# Patient Record
Sex: Female | Born: 1970
Health system: Southern US, Community
[De-identification: ages and names within clinical notes are randomized; demographics above are authoritative.]

## PROBLEM LIST (undated history)

## (undated) DIAGNOSIS — G8929 Other chronic pain: Secondary | ICD-10-CM

## (undated) DIAGNOSIS — N200 Calculus of kidney: Secondary | ICD-10-CM

## (undated) DIAGNOSIS — M549 Dorsalgia, unspecified: Secondary | ICD-10-CM

## (undated) DIAGNOSIS — M1990 Unspecified osteoarthritis, unspecified site: Secondary | ICD-10-CM

## (undated) DIAGNOSIS — M25569 Pain in unspecified knee: Secondary | ICD-10-CM

## (undated) DIAGNOSIS — K219 Gastro-esophageal reflux disease without esophagitis: Secondary | ICD-10-CM

## (undated) DIAGNOSIS — Z8489 Family history of other specified conditions: Secondary | ICD-10-CM

## (undated) DIAGNOSIS — N920 Excessive and frequent menstruation with regular cycle: Secondary | ICD-10-CM

## (undated) DIAGNOSIS — K589 Irritable bowel syndrome without diarrhea: Secondary | ICD-10-CM

## (undated) DIAGNOSIS — R51 Headache: Secondary | ICD-10-CM

## (undated) DIAGNOSIS — F419 Anxiety disorder, unspecified: Secondary | ICD-10-CM

## (undated) DIAGNOSIS — R519 Headache, unspecified: Secondary | ICD-10-CM

## (undated) HISTORY — DX: Calculus of kidney: N20.0

## (undated) HISTORY — PX: TUBAL LIGATION: SHX77

## (undated) HISTORY — DX: Excessive and frequent menstruation with regular cycle: N92.0

---

## 2000-07-02 ENCOUNTER — Observation Stay (HOSPITAL_COMMUNITY): Admission: AD | Admit: 2000-07-02 | Discharge: 2000-07-02 | Payer: Self-pay | Admitting: Obstetrics and Gynecology

## 2000-08-23 ENCOUNTER — Ambulatory Visit (HOSPITAL_COMMUNITY): Admission: RE | Admit: 2000-08-23 | Discharge: 2000-08-23 | Payer: Self-pay | Admitting: Obstetrics and Gynecology

## 2000-10-02 ENCOUNTER — Ambulatory Visit (HOSPITAL_COMMUNITY): Admission: RE | Admit: 2000-10-02 | Discharge: 2000-10-02 | Payer: Self-pay | Admitting: Obstetrics and Gynecology

## 2000-10-05 ENCOUNTER — Ambulatory Visit (HOSPITAL_COMMUNITY): Admission: RE | Admit: 2000-10-05 | Discharge: 2000-10-05 | Payer: Self-pay | Admitting: Obstetrics and Gynecology

## 2000-10-09 ENCOUNTER — Ambulatory Visit (HOSPITAL_COMMUNITY): Admission: RE | Admit: 2000-10-09 | Discharge: 2000-10-09 | Payer: Self-pay | Admitting: Obstetrics and Gynecology

## 2000-10-12 ENCOUNTER — Ambulatory Visit (HOSPITAL_COMMUNITY): Admission: RE | Admit: 2000-10-12 | Discharge: 2000-10-12 | Payer: Self-pay | Admitting: Obstetrics and Gynecology

## 2000-10-16 ENCOUNTER — Ambulatory Visit (HOSPITAL_COMMUNITY): Admission: RE | Admit: 2000-10-16 | Discharge: 2000-10-16 | Payer: Self-pay | Admitting: Obstetrics and Gynecology

## 2000-10-19 ENCOUNTER — Ambulatory Visit (HOSPITAL_COMMUNITY): Admission: RE | Admit: 2000-10-19 | Discharge: 2000-10-19 | Payer: Self-pay | Admitting: Obstetrics and Gynecology

## 2000-10-23 ENCOUNTER — Ambulatory Visit (HOSPITAL_COMMUNITY): Admission: RE | Admit: 2000-10-23 | Discharge: 2000-10-24 | Payer: Self-pay | Admitting: Obstetrics and Gynecology

## 2000-10-23 ENCOUNTER — Ambulatory Visit (HOSPITAL_COMMUNITY): Admission: RE | Admit: 2000-10-23 | Discharge: 2000-10-23 | Payer: Self-pay | Admitting: *Deleted

## 2000-10-26 ENCOUNTER — Ambulatory Visit (HOSPITAL_COMMUNITY): Admission: RE | Admit: 2000-10-26 | Discharge: 2000-10-26 | Payer: Self-pay | Admitting: Obstetrics and Gynecology

## 2000-10-30 ENCOUNTER — Ambulatory Visit (HOSPITAL_COMMUNITY): Admission: RE | Admit: 2000-10-30 | Discharge: 2000-10-30 | Payer: Self-pay | Admitting: Obstetrics and Gynecology

## 2000-11-02 ENCOUNTER — Ambulatory Visit (HOSPITAL_COMMUNITY): Admission: RE | Admit: 2000-11-02 | Discharge: 2000-11-02 | Payer: Self-pay | Admitting: Obstetrics and Gynecology

## 2000-11-06 ENCOUNTER — Ambulatory Visit (HOSPITAL_COMMUNITY): Admission: RE | Admit: 2000-11-06 | Discharge: 2000-11-06 | Payer: Self-pay | Admitting: Obstetrics and Gynecology

## 2000-11-12 ENCOUNTER — Inpatient Hospital Stay (HOSPITAL_COMMUNITY): Admission: AD | Admit: 2000-11-12 | Discharge: 2000-11-15 | Payer: Self-pay | Admitting: *Deleted

## 2001-07-26 ENCOUNTER — Emergency Department (HOSPITAL_COMMUNITY): Admission: EM | Admit: 2001-07-26 | Discharge: 2001-07-26 | Payer: Self-pay | Admitting: Emergency Medicine

## 2002-04-02 ENCOUNTER — Emergency Department (HOSPITAL_COMMUNITY): Admission: EM | Admit: 2002-04-02 | Discharge: 2002-04-02 | Payer: Self-pay | Admitting: Emergency Medicine

## 2002-04-02 ENCOUNTER — Encounter: Payer: Self-pay | Admitting: Emergency Medicine

## 2005-10-10 ENCOUNTER — Ambulatory Visit (HOSPITAL_COMMUNITY): Admission: RE | Admit: 2005-10-10 | Discharge: 2005-10-10 | Payer: Self-pay | Admitting: Urology

## 2006-11-19 ENCOUNTER — Other Ambulatory Visit: Admission: RE | Admit: 2006-11-19 | Discharge: 2006-11-19 | Payer: Self-pay | Admitting: Obstetrics and Gynecology

## 2007-03-27 ENCOUNTER — Encounter: Admission: RE | Admit: 2007-03-27 | Discharge: 2007-03-27 | Payer: Self-pay | Admitting: Family Medicine

## 2007-04-02 ENCOUNTER — Encounter: Admission: RE | Admit: 2007-04-02 | Discharge: 2007-04-02 | Payer: Self-pay | Admitting: Family Medicine

## 2009-06-28 ENCOUNTER — Encounter: Admission: RE | Admit: 2009-06-28 | Discharge: 2009-06-28 | Payer: Self-pay | Admitting: Family Medicine

## 2012-06-27 ENCOUNTER — Telehealth: Payer: Self-pay | Admitting: Family Medicine

## 2012-06-27 NOTE — Telephone Encounter (Signed)
ntbs for good care/standard of care

## 2012-06-27 NOTE — Telephone Encounter (Signed)
Pt has a sinus infection and sinus headaches, with her work schedule she just can't make it into the office for an appt, can you call in something for her to relieve the symptoms? She has tried advil(not advil sinus), alka seltzer plus, nyquil. Please call into [facility]

## 2012-06-28 ENCOUNTER — Encounter: Payer: Self-pay | Admitting: *Deleted

## 2012-06-28 NOTE — Telephone Encounter (Signed)
Pt stated she will go to urgent care for her needs because of her work schedule when offered an appointment later this afternoon.

## 2012-07-24 ENCOUNTER — Ambulatory Visit (HOSPITAL_COMMUNITY)
Admission: RE | Admit: 2012-07-24 | Discharge: 2012-07-24 | Disposition: A | Discharge status: Home / Self Care | Payer: Managed Care, Other (non HMO) | Source: Ambulatory Visit | Attending: Family Medicine | Admitting: Family Medicine

## 2012-07-24 ENCOUNTER — Encounter: Payer: Self-pay | Admitting: Family Medicine

## 2012-07-24 ENCOUNTER — Ambulatory Visit (INDEPENDENT_AMBULATORY_CARE_PROVIDER_SITE_OTHER): Payer: Managed Care, Other (non HMO) | Admitting: Family Medicine

## 2012-07-24 VITALS — BP 108/74 | Wt 181.0 lb

## 2012-07-24 DIAGNOSIS — M25569 Pain in unspecified knee: Secondary | ICD-10-CM | POA: Insufficient documentation

## 2012-07-24 DIAGNOSIS — Z9181 History of falling: Secondary | ICD-10-CM | POA: Insufficient documentation

## 2012-07-24 DIAGNOSIS — M25561 Pain in right knee: Secondary | ICD-10-CM

## 2012-07-24 DIAGNOSIS — M25469 Effusion, unspecified knee: Secondary | ICD-10-CM | POA: Insufficient documentation

## 2012-07-24 DIAGNOSIS — M1990 Unspecified osteoarthritis, unspecified site: Secondary | ICD-10-CM | POA: Insufficient documentation

## 2012-07-24 DIAGNOSIS — M129 Arthropathy, unspecified: Secondary | ICD-10-CM

## 2012-07-24 MED ORDER — ETODOLAC 400 MG PO TABS: 400.0000 mg | ORAL_TABLET | Freq: Two times a day (BID) | ORAL | Status: DC

## 2012-07-24 NOTE — Progress Notes (Signed)
  Subjective:    Patient ID: [name], female    DOB: 1970/09/24, 42 y.o.   MRN: [id number]  Knee Pain  The incident occurred more than 1 week ago. The incident occurred in the street. There was no injury mechanism. The pain is present in the right knee. The quality of the pain is described as stabbing. The pain is at a severity of 6/10. The pain is moderate. The pain has been fluctuating since onset. Associated symptoms include a loss of motion. She reports no foreign bodies present. The symptoms are aggravated by movement. She has tried NSAIDs for the symptoms. The treatment provided moderate relief.   Pain excrutiating at times. Popping at times  Patient didn't strike foot against curb it. Couple weeks ago. Had a sudden increase amount of pain. No major history knee injury as a child. Review of Systems ROS otherwise negative.    Objective:   Physical Exam Alert vitals reviewed. Lungs clear. Heart regular in rhythm. Right knee positive crepitations with extension. Some medial tenderness closer to the patella. No obvious effusion. No joint laxity.       Assessment & Plan:  Impression #1 chronic knee pain with acute flare. Hopefully not medial meniscus. Discussed. Plan try Lodine 400 one by mouth twice a day with food. Hydrocodone 5/325 #20 [id number].Marland Kitchen X-ray knee symptomatic care discussed.

## 2012-08-14 ENCOUNTER — Emergency Department (HOSPITAL_COMMUNITY)
Admission: EM | Admit: 2012-08-14 | Discharge: 2012-08-14 | Disposition: A | Discharge status: Home / Self Care | Payer: Managed Care, Other (non HMO) | Attending: Emergency Medicine | Admitting: Emergency Medicine

## 2012-08-14 ENCOUNTER — Encounter (HOSPITAL_COMMUNITY): Payer: Self-pay | Admitting: Emergency Medicine

## 2012-08-14 ENCOUNTER — Emergency Department (HOSPITAL_COMMUNITY): Payer: Managed Care, Other (non HMO)

## 2012-08-14 DIAGNOSIS — K219 Gastro-esophageal reflux disease without esophagitis: Secondary | ICD-10-CM | POA: Insufficient documentation

## 2012-08-14 DIAGNOSIS — F17200 Nicotine dependence, unspecified, uncomplicated: Secondary | ICD-10-CM | POA: Insufficient documentation

## 2012-08-14 DIAGNOSIS — G8929 Other chronic pain: Secondary | ICD-10-CM | POA: Insufficient documentation

## 2012-08-14 DIAGNOSIS — R109 Unspecified abdominal pain: Secondary | ICD-10-CM | POA: Insufficient documentation

## 2012-08-14 DIAGNOSIS — Z880 Allergy status to penicillin: Secondary | ICD-10-CM | POA: Insufficient documentation

## 2012-08-14 HISTORY — DX: Dorsalgia, unspecified: M54.9

## 2012-08-14 HISTORY — DX: Other chronic pain: G89.29

## 2012-08-14 HISTORY — DX: Pain in unspecified knee: M25.569

## 2012-08-14 LAB — COMPREHENSIVE METABOLIC PANEL
ALT: 19 U/L (ref 0–35)
AST: 16 U/L (ref 0–37)
Albumin: 3.6 g/dL (ref 3.5–5.2)
Alkaline Phosphatase: 62 U/L (ref 39–117)
BUN: 14 mg/dL (ref 6–23)
CO2: 26 mEq/L (ref 19–32)
Calcium: 9.3 mg/dL (ref 8.4–10.5)
Chloride: 105 mEq/L (ref 96–112)
Creatinine, Ser: 0.68 mg/dL (ref 0.50–1.10)
GFR calc Af Amer: >90 mL/min (ref >90)
GFR calc non Af Amer: >90 mL/min (ref >90)
Glucose, Bld: 80 mg/dL (ref 70–99)
Potassium: 3.5 mEq/L (ref 3.5–5.1)
Sodium: 140 mEq/L (ref 135–145)
Total Bilirubin: 0.2 mg/dL — ABNORMAL LOW (ref 0.3–1.2)
Total Protein: 6.4 g/dL (ref 6.0–8.3)

## 2012-08-14 LAB — CBC WITH DIFFERENTIAL/PLATELET
Basophils Absolute: 0 10*3/uL (ref 0.0–0.1)
Basophils Relative: 0 % (ref 0–1)
Eosinophils Absolute: 0.1 10*3/uL (ref 0.0–0.7)
Eosinophils Relative: 1 % (ref 0–5)
HCT: 41.4 % (ref 36.0–46.0)
Hemoglobin: 13.7 g/dL (ref 12.0–15.0)
Lymphocytes Relative: 20 % (ref 12–46)
Lymphs Abs: 2.2 10*3/uL (ref 0.7–4.0)
MCH: 29 pg (ref 26.0–34.0)
MCHC: 33.1 g/dL (ref 30.0–36.0)
MCV: 87.7 fL (ref 78.0–100.0)
Monocytes Absolute: 1.1 10*3/uL — ABNORMAL HIGH (ref 0.1–1.0)
Monocytes Relative: 10 % (ref 3–12)
Neutro Abs: 7.4 10*3/uL (ref 1.7–7.7)
Neutrophils Relative %: 68 % (ref 43–77)
Platelets: 147 10*3/uL — ABNORMAL LOW (ref 150–400)
RBC: 4.72 MIL/uL (ref 3.87–5.11)
RDW: 13.5 % (ref 11.5–15.5)
WBC: 10.8 10*3/uL — ABNORMAL HIGH (ref 4.0–10.5)

## 2012-08-14 LAB — TROPONIN I: Troponin I: <0.3 ng/mL (ref <0.30)

## 2012-08-14 LAB — LIPASE, BLOOD: Lipase: 45 U/L (ref 11–59)

## 2012-08-14 MED ORDER — ONDANSETRON HCL 4 MG/2ML IJ SOLN
4.0000 mg | INTRAMUSCULAR | Status: DC | PRN
  Administered 2012-08-14: 4 mg via INTRAVENOUS
  Filled 2012-08-14: qty 2

## 2012-08-14 MED ORDER — FAMOTIDINE IN NACL 20-0.9 MG/50ML-% IV SOLN
20.0000 mg | Freq: Once | INTRAVENOUS | Status: AC
  Administered 2012-08-14: 20 mg via INTRAVENOUS
  Filled 2012-08-14: qty 50

## 2012-08-14 MED ORDER — PANTOPRAZOLE SODIUM 40 MG IV SOLR
40.0000 mg | Freq: Once | INTRAVENOUS | Status: AC
  Administered 2012-08-14: 40 mg via INTRAVENOUS
  Filled 2012-08-14: qty 40

## 2012-08-14 MED ORDER — MORPHINE SULFATE 4 MG/ML IJ SOLN
4.0000 mg | INTRAMUSCULAR | Status: DC | PRN
  Administered 2012-08-14: 4 mg via INTRAVENOUS
  Filled 2012-08-14: qty 1

## 2012-08-14 MED ORDER — SODIUM CHLORIDE 0.9 % IV SOLN
INTRAVENOUS | Status: DC
  Administered 2012-08-14: 09:00:00 via INTRAVENOUS

## 2012-08-14 MED ORDER — GI COCKTAIL ~~LOC~~
30.0000 mL | Freq: Once | ORAL | Status: AC
  Administered 2012-08-14: 30 mL via ORAL
  Filled 2012-08-14: qty 30

## 2012-08-14 NOTE — ED Provider Notes (Signed)
History     CSN: [id number]  Arrival date & time 08/14/12  4098   First MD Initiated Contact with Patient 08/14/12 0740      Chief Complaint  Patient presents with  . Chest Pain     HPI Pt was seen at 0755.  Per pt, c/o gradual onset and persistence of constant lower mid-sternal/upper mid-epigastric "pain" that began approx 0600 this morning. Pt describes the discomfort as "sharp," "pressure." Pain radiates into her mid-back area. States she tried to take OTC Tums and drink a carbonated beverage without relief of discomfort. Endorses she "takes a lot of BC powders" for her chronic pain.  Denies palpitations, no N/V/D, no SOB/cough, no back/flank pain, no fevers, no rash.     Past Medical History  Diagnosis Date  . Chronic knee pain   . Chronic back pain     Past Surgical History  Procedure Laterality Date  . Cesarean section     No FHx premature CAD. Family History  Problem Relation Age of Onset  . Hypertension Mother   . Cancer Father     colon  . Diabetes Maternal Grandmother   . Diabetes Paternal Grandmother     History  Substance Use Topics  . Smoking status: Current Every Day Smoker  . Smokeless tobacco: Not on file  . Alcohol Use: No      Review of Systems ROS: Statement: All systems negative except as marked or noted in the HPI; Constitutional: Negative for fever and chills. ; ; Eyes: Negative for eye pain, redness and discharge. ; ; ENMT: Negative for ear pain, hoarseness, nasal congestion, sinus pressure and sore throat. ; ; Cardiovascular: +CP. Negative for palpitations, diaphoresis, dyspnea and peripheral edema. ; ; Respiratory: Negative for cough, wheezing and stridor. ; ; Gastrointestinal: +upper abd pain. Negative for nausea, vomiting, diarrhea, blood in stool, hematemesis, jaundice and rectal bleeding. . ; ; Genitourinary: Negative for dysuria, flank pain and hematuria. ; ; Musculoskeletal: Negative for back pain and neck pain. Negative for swelling and  trauma.; ; Skin: Negative for pruritus, rash, abrasions, blisters, bruising and skin lesion.; ; Neuro: Negative for headache, lightheadedness and neck stiffness. Negative for weakness, altered level of consciousness , altered mental status, extremity weakness, paresthesias, involuntary movement, seizure and syncope.       Allergies  Penicillins; Sulfa antibiotics; and Xanax  Home Medications   Current Outpatient Rx  Name  Route  Sig  Dispense  Refill  . etodolac (LODINE) 400 MG tablet   Oral   Take 1 tablet (400 mg total) by mouth 2 (two) times daily.   28 tablet   0     BP 141/72  Pulse 72  Temp(Src) 98.2 F (36.8 C) (Oral)  Resp 26  Ht 5\' 6"  (1.676 m)  Wt 181 lb (82.101 kg)  BMI 29.23 kg/m2  SpO2 100%  LMP 08/03/2012  Physical Exam 0800: Physical examination:  Nursing notes reviewed; Vital signs and O2 SAT reviewed;  Constitutional: Well developed, Well nourished, Well hydrated, In no acute distress; Head:  Normocephalic, atraumatic; Eyes: EOMI, PERRL, No scleral icterus; ENMT: Mouth and pharynx normal, Mucous membranes moist; Neck: Supple, Full range of motion, No lymphadenopathy; Cardiovascular: Regular rate and rhythm, No murmur, rub, or gallop; Respiratory: Breath sounds clear & equal bilaterally, No rales, rhonchi, wheezes.  Speaking full sentences with ease, Normal respiratory effort/excursion; Chest: Nontender, Movement normal; Abdomen: Soft, +mid-epigastric area tenderness to palp. No rebound or guarding. Nondistended, Normal bowel sounds; Genitourinary: No CVA tenderness;  Extremities: Pulses normal, No tenderness, No edema, No calf edema or asymmetry.; Neuro: AA&Ox3, Major CN grossly intact.  Speech clear. No gross focal motor or sensory deficits in extremities.; Skin: Color normal, Warm, Dry.   ED Course  Procedures    MDM  MDM Reviewed: previous chart, nursing note and vitals Interpretation: ECG, labs and x-ray    Date: 08/14/2012  Rate: 70  Rhythm: normal  sinus rhythm  QRS Axis: normal  Intervals: normal  ST/T Wave abnormalities: normal  Conduction Disutrbances:none  Narrative Interpretation:   Old EKG Reviewed: none available.  Results for orders placed during the hospital encounter of 08/14/12  CBC WITH DIFFERENTIAL      Result Value Range   WBC 10.8 (*) 4.0 - 10.5 K/uL   RBC 4.72  3.87 - 5.11 MIL/uL   Hemoglobin 13.7  12.0 - 15.0 g/dL   HCT 87.5  64.3 - 32.9 %   MCV 87.7  78.0 - 100.0 fL   MCH 29.0  26.0 - 34.0 pg   MCHC 33.1  30.0 - 36.0 g/dL   RDW 51.8  84.1 - 66.0 %   Platelets 147 (*) 150 - 400 K/uL   Neutrophils Relative % 68  43 - 77 %   Neutro Abs 7.4  1.7 - 7.7 K/uL   Lymphocytes Relative 20  12 - 46 %   Lymphs Abs 2.2  0.7 - 4.0 K/uL   Monocytes Relative 10  3 - 12 %   Monocytes Absolute 1.1 (*) 0.1 - 1.0 K/uL   Eosinophils Relative 1  0 - 5 %   Eosinophils Absolute 0.1  0.0 - 0.7 K/uL   Basophils Relative 0  0 - 1 %   Basophils Absolute 0.0  0.0 - 0.1 K/uL  COMPREHENSIVE METABOLIC PANEL      Result Value Range   Sodium 140  135 - 145 mEq/L   Potassium 3.5  3.5 - 5.1 mEq/L   Chloride 105  96 - 112 mEq/L   CO2 26  19 - 32 mEq/L   Glucose, Bld 80  70 - 99 mg/dL   BUN 14  6 - 23 mg/dL   Creatinine, Ser 6.30  0.50 - 1.10 mg/dL   Calcium 9.3  8.4 - 16.0 mg/dL   Total Protein 6.4  6.0 - 8.3 g/dL   Albumin 3.6  3.5 - 5.2 g/dL   AST 16  0 - 37 U/L   ALT 19  0 - 35 U/L   Alkaline Phosphatase 62  39 - 117 U/L   Total Bilirubin 0.2 (*) 0.3 - 1.2 mg/dL   GFR calc non Af Amer >90  >90 mL/min   GFR calc Af Amer >90  >90 mL/min  TROPONIN I      Result Value Range   Troponin I <0.30  <0.30 ng/mL  LIPASE, BLOOD      Result Value Range   Lipase 45  11 - 59 U/L   Dg Chest 2 View 08/14/2012   *RADIOLOGY REPORT*  Clinical Data: Retrosternal chest pain.  CHEST - 2 VIEW  Comparison: 11/10/2011  Findings: The heart size and pulmonary vascularity are normal and the lungs are clear.  No osseous abnormality.  IMPRESSION: Normal  exam.   Original Report Authenticated By: Francene Boyers, M.D.     1000:  Doubt PE as cause for symptoms with low risk [place].  Doubt ACS as cause for symptoms with normal troponin and EKG, TIMI 0.  Pt takes excessive amounts of  BC powders and lodine; pt cautioned regarding these meds as contributing to her pain. Verb understanding. Pt had significant relief of pain after GI cocktail, pepcid IV and protonix IV. Will continue to tx symptomatically. Pt wants to go home now. Dx and testing d/w pt.  Questions answered.  Verb understanding, agreeable to d/c home with outpt f/u.     Laray Anger, DO 08/14/12 2225

## 2012-08-14 NOTE — ED Notes (Signed)
Chest pain onset around 6am, feels like the worst case of indigestion I've ever  had

## 2012-09-09 ENCOUNTER — Encounter: Payer: Self-pay | Admitting: Adult Health

## 2012-09-09 ENCOUNTER — Ambulatory Visit (INDEPENDENT_AMBULATORY_CARE_PROVIDER_SITE_OTHER): Payer: Managed Care, Other (non HMO) | Admitting: Adult Health

## 2012-09-09 VITALS — BP 120/86 | Ht 66.0 in | Wt 188.0 lb

## 2012-09-09 DIAGNOSIS — Z13220 Encounter for screening for lipoid disorders: Secondary | ICD-10-CM

## 2012-09-09 DIAGNOSIS — M545 Low back pain, unspecified: Secondary | ICD-10-CM

## 2012-09-09 DIAGNOSIS — N920 Excessive and frequent menstruation with regular cycle: Secondary | ICD-10-CM

## 2012-09-09 DIAGNOSIS — G8929 Other chronic pain: Secondary | ICD-10-CM | POA: Insufficient documentation

## 2012-09-09 HISTORY — DX: Excessive and frequent menstruation with regular cycle: N92.0

## 2012-09-09 LAB — LIPID PANEL
Cholesterol: 136 mg/dL (ref 0–200)
HDL: 42 mg/dL (ref >39)
LDL Cholesterol: 75 mg/dL (ref 0–99)
Total CHOL/HDL Ratio: 3.2 Ratio
Triglycerides: 94 mg/dL (ref <150)
VLDL: 19 mg/dL (ref 0–40)

## 2012-09-09 LAB — COMPREHENSIVE METABOLIC PANEL
ALT: 9 U/L (ref 0–35)
AST: 11 U/L (ref 0–37)
Albumin: 3.7 g/dL (ref 3.5–5.2)
Alkaline Phosphatase: 55 U/L (ref 39–117)
BUN: 14 mg/dL (ref 6–23)
CO2: 26 mEq/L (ref 19–32)
Calcium: 8.6 mg/dL (ref 8.4–10.5)
Chloride: 107 mEq/L (ref 96–112)
Creat: 0.67 mg/dL (ref 0.50–1.10)
Glucose, Bld: 78 mg/dL (ref 70–99)
Potassium: 4.2 mEq/L (ref 3.5–5.3)
Sodium: 141 mEq/L (ref 135–145)
Total Bilirubin: 0.4 mg/dL (ref 0.3–1.2)
Total Protein: 5.8 g/dL — ABNORMAL LOW (ref 6.0–8.3)

## 2012-09-09 LAB — CBC
HCT: 40.3 % (ref 36.0–46.0)
Hemoglobin: 13.5 g/dL (ref 12.0–15.0)
MCH: 28.7 pg (ref 26.0–34.0)
MCHC: 33.5 g/dL (ref 30.0–36.0)
MCV: 85.6 fL (ref 78.0–100.0)
Platelets: 139 10*3/uL — ABNORMAL LOW (ref 150–400)
RBC: 4.71 MIL/uL (ref 3.87–5.11)
RDW: 13.3 % (ref 11.5–15.5)
WBC: 8.1 10*3/uL (ref 4.0–10.5)

## 2012-09-09 NOTE — Progress Notes (Signed)
Subjective:     Patient ID: [name], female   DOB: 1970-04-27, 42 y.o.   MRN: [id number]  HPI [name] is a 42 year old white female in complaining of low back pain after menses and she bleeds 5-7 days and it is heavy the first few days.She works 12 hour shifts at [employer]. She takes 2-3 BC powders when in pain, and has occasional reflux and take Tums.She saw [name] NP about a year ago for this and had ? Pap then, she said she was checked for GC/CHL then too.  Review of Systems Positives in HPI    Reviewed past medical,surgical, social and family history. Reviewed medications and allergies.  Objective:   Physical Exam BP 120/86  Ht 5\' 6"  (1.676 m)  Wt 188 lb (85.276 kg)  BMI 30.36 kg/m2  LMP 08/24/2012   Skin warm and dry.Pelvic: external genitalia is normal in appearance, vagina: normal in appearance, cervix:smooth and bulbous, uterus: slightly enlarged in size, non tender, adnexa: no masses or tenderness noted.  Assessment:      Low back pain Menorrhagia    Plan:      Check CBC,CMP,TSH and lipids Pelvic US in 1 week to assess uterus and see me about treatment options, review handout on menorrhagia and back pain.

## 2012-09-09 NOTE — Patient Instructions (Addendum)
Menorrhagia Dysfunctional uterine bleeding is different from a normal menstrual period. When periods are heavy or there is more bleeding than is usual for you, it is called menorrhagia. It may be caused by hormonal imbalance, or physical, metabolic, or other problems. Examination is necessary in order that your caregiver may treat treatable causes. If this is a continuing problem, a D&C may be needed. That means that the cervix (the opening of the uterus or womb) is dilated (stretched larger) and the lining of the uterus is scraped out. The tissue scraped out is then examined under a microscope by a specialist (pathologist) to make sure there is nothing of concern that needs further or more extensive treatment. HOME CARE INSTRUCTIONS   If medications were prescribed, take exactly as directed. Do not change or switch medications without consulting your caregiver.  Long term heavy bleeding may result in iron deficiency. Your caregiver may have prescribed iron pills. They help replace the iron your body lost from heavy bleeding. Take exactly as directed. Iron may cause constipation. If this becomes a problem, increase the bran, fruits, and roughage in your diet.  Do not take aspirin or medicines that contain aspirin one week before or during your menstrual period. Aspirin may make the bleeding worse.  If you need to change your sanitary pad or tampon more than once every 2 hours, stay in bed and rest as much as possible until the bleeding stops.  Eat well-balanced meals. Eat foods high in iron. Examples are leafy green vegetables, meat, liver, eggs, and whole grain breads and cereals. Do not try to lose weight until the abnormal bleeding has stopped and your blood iron level is back to normal. SEEK MEDICAL CARE IF:   You need to change your sanitary pad or tampon more than once an hour.  You develop nausea (feeling sick to your stomach) and vomiting, dizziness, or diarrhea while you are taking your  medicine.  You have any problems that may be related to the medicine you are taking. SEEK IMMEDIATE MEDICAL CARE IF:   You have a fever.  You develop chills.  You develop severe bleeding or start to pass blood clots.  You feel dizzy or faint. MAKE SURE YOU:   Understand these instructions.  Will watch your condition.  Will get help right away if you are not doing well or get worse. Document Released: 02/13/2005 Document Revised: 05/08/2011 Document Reviewed: 10/04/2007 [facility] Patient Information 2014 [place], [place]. USBack Pain, Adult Low back pain is very common. About 1 in 5 people have back pain.The cause of low back pain is rarely dangerous. The pain often gets better over time.About half of people with a sudden onset of back pain feel better in just 2 weeks. About 8 in 10 people feel better by 6 weeks.  CAUSES Some common causes of back pain include:  Strain of the muscles or ligaments supporting the spine.  Wear and tear (degeneration) of the spinal discs.  Arthritis.  Direct injury to the back. DIAGNOSIS Most of the time, the direct cause of low back pain is not known.However, back pain can be treated effectively even when the exact cause of the pain is unknown.Answering your caregiver's questions about your overall health and symptoms is one of the most accurate ways to make sure the cause of your pain is not dangerous. If your caregiver needs more information, he or she may order lab work or imaging tests (X-rays or MRIs).However, even if imaging tests show changes in your  back, this usually does not require surgery. HOME CARE INSTRUCTIONS For many people, back pain returns.Since low back pain is rarely dangerous, it is often a condition that people can learn to [facility] their own.   Remain active. It is stressful on the back to sit or stand in one place. Do not sit, drive, or stand in one place for more than 30 minutes at a time. Take short walks on level  surfaces as soon as pain allows.Try to increase the length of time you walk each day.  Do not stay in bed.Resting more than 1 or 2 days can delay your recovery.  Do not avoid exercise or work.Your body is made to move.It is not dangerous to be active, even though your back may hurt.Your back will likely heal faster if you return to being active before your pain is gone.  Pay attention to your body when you bend and lift. Many people have less discomfortwhen lifting if they bend their knees, keep the load close to their bodies,and avoid twisting. Often, the most comfortable positions are those that put less stress on your recovering back.  Find a comfortable position to sleep. Use a firm mattress and lie on your side with your knees slightly bent. If you lie on your back, put a pillow under your knees.  Only take over-the-counter or prescription medicines as directed by your caregiver. Over-the-counter medicines to reduce pain and inflammation are often the most helpful.Your caregiver may prescribe muscle relaxant drugs.These medicines help dull your pain so you can more quickly return to your normal activities and healthy exercise.  Put ice on the injured area.  Put ice in a plastic bag.  Place a towel between your skin and the bag.  Leave the ice on for 15-20 minutes, 3-4 times a day for the first 2 to 3 days. After that, ice and heat may be alternated to reduce pain and spasms.  Ask your caregiver about trying back exercises and gentle massage. This may be of some benefit.  Avoid feeling anxious or stressed.Stress increases muscle tension and can worsen back pain.It is important to recognize when you are anxious or stressed and learn ways to manage it.Exercise is a great option. SEEK MEDICAL CARE IF:  You have pain that is not relieved with rest or medicine.  You have pain that does not improve in 1 week.  You have new symptoms.  You are generally not feeling well. SEEK  IMMEDIATE MEDICAL CARE IF:   You have pain that radiates from your back into your legs.  You develop new bowel or bladder control problems.  You have unusual weakness or numbness in your arms or legs.  You develop nausea or vomiting.  You develop abdominal pain.  You feel faint. Document Released: 02/13/2005 Document Revised: 08/15/2011 Document Reviewed: 07/04/2010 [facility] Patient Information 2014 [place], [place].  in 1 week

## 2012-09-10 ENCOUNTER — Telehealth: Payer: Self-pay | Admitting: Adult Health

## 2012-09-10 LAB — TSH: TSH: 2.454 u[IU]/mL (ref 0.350–4.500)

## 2012-09-10 NOTE — Telephone Encounter (Signed)
No answer

## 2012-09-11 ENCOUNTER — Telehealth: Payer: Self-pay | Admitting: Adult Health

## 2012-09-11 NOTE — Telephone Encounter (Signed)
Pt has already spoke with JAG. JSY

## 2012-09-17 ENCOUNTER — Telehealth: Payer: Self-pay | Admitting: Family Medicine

## 2012-09-17 NOTE — Telephone Encounter (Signed)
Certainly can try different nsaid, but pt may benefit from ortho appt and knee injection see what pt wants to try

## 2012-09-17 NOTE — Telephone Encounter (Signed)
Patient willing to do ortho [facility]- prefers mondays before lunch Knee swelling when she works and she has got to work

## 2012-09-17 NOTE — Telephone Encounter (Signed)
Patient states her knee is no better.  She returned to work last night (09/16/12), Today her right knee is swollen and painful.  She is currently using Aleve.  Has used all the prescription medication given to her by Dr.  Dewayne Hatch to know what she can do at this point for her knee.  Patient is on the way into work at this point.  She would like for someone to give her a return a call.  Thanks

## 2012-09-18 ENCOUNTER — Other Ambulatory Visit: Payer: Self-pay | Admitting: Family Medicine

## 2012-09-18 DIAGNOSIS — M25569 Pain in unspecified knee: Secondary | ICD-10-CM

## 2012-09-18 NOTE — Telephone Encounter (Signed)
Referral was sent to referral specialist, Dr. Romeo Apple

## 2012-09-20 ENCOUNTER — Encounter: Payer: Self-pay | Admitting: Adult Health

## 2012-09-20 ENCOUNTER — Ambulatory Visit (INDEPENDENT_AMBULATORY_CARE_PROVIDER_SITE_OTHER): Payer: Managed Care, Other (non HMO)

## 2012-09-20 ENCOUNTER — Ambulatory Visit (INDEPENDENT_AMBULATORY_CARE_PROVIDER_SITE_OTHER): Payer: Managed Care, Other (non HMO) | Admitting: Adult Health

## 2012-09-20 VITALS — BP 120/90 | Ht 65.0 in | Wt 186.0 lb

## 2012-09-20 DIAGNOSIS — N920 Excessive and frequent menstruation with regular cycle: Secondary | ICD-10-CM

## 2012-09-20 DIAGNOSIS — M545 Low back pain, unspecified: Secondary | ICD-10-CM

## 2012-09-20 MED ORDER — HYDROCODONE-ACETAMINOPHEN 5-325 MG PO TABS: 1.0000 | ORAL_TABLET | Freq: Four times a day (QID) | ORAL | Status: DC | PRN

## 2012-09-20 MED ORDER — MEGESTROL ACETATE 40 MG PO TABS: ORAL_TABLET | ORAL | Status: DC

## 2012-09-20 NOTE — Patient Instructions (Addendum)
Take megace and follow in 3 months

## 2012-09-20 NOTE — Progress Notes (Signed)
Subjective:     Patient ID: [name], female   DOB: [birth year], 42 y.o.   MRN: [id number]  HPI [name] is back today for [place] to evaluate menorrhagia with low back pain.The US showed a 9.3 x 6.3 x 4.8 cm uterus,ovaries were normal,her endometrium measured 10.5 mm, but her period is due, and no masses seen and no free fluid.She works 12 hour shifts. Has cruise scheduled in September and does not want to be bleeding or in pain.  Review of Systems See HPI   Reviewed past medical,surgical, social and family history. Reviewed medications and allergies.  Objective:   Physical Exam BP 120/90  Ht 5\' 5"  (1.651 m)  Wt 186 lb (84.369 kg)  BMI 30.95 kg/m2  LMP 08/24/2012    Reviewed [place] with pt and discussed options of megace, endo ablation or following.Will try megace to start on day 5 of cycle and take 1 40 mg tablet daily and review endo ablation handout. Discussed with Dr Emelda Fear.  Assessment:     Menorrhagia Low back pain    Plan:     Rx megace 40 mg 1 daily #30 with 3 refills Rx Norco 5/325 mg #30 1 every 6 hours prn pain Review handout on ablation  Follow up in 3 months

## 2012-10-01 ENCOUNTER — Ambulatory Visit (INDEPENDENT_AMBULATORY_CARE_PROVIDER_SITE_OTHER): Payer: Managed Care, Other (non HMO) | Admitting: Orthopedic Surgery

## 2012-10-01 VITALS — BP 121/72 | Ht 65.0 in | Wt 184.0 lb

## 2012-10-01 DIAGNOSIS — M1710 Unilateral primary osteoarthritis, unspecified knee: Secondary | ICD-10-CM

## 2012-10-01 MED ORDER — DICLOFENAC POTASSIUM 50 MG PO TABS: 50.0000 mg | ORAL_TABLET | Freq: Two times a day (BID) | ORAL | Status: DC

## 2012-10-01 MED ORDER — HYDROCODONE-ACETAMINOPHEN 5-325 MG PO TABS: 1.0000 | ORAL_TABLET | Freq: Four times a day (QID) | ORAL | Status: DC | PRN

## 2012-10-01 NOTE — Patient Instructions (Addendum)
Start home exercises   Start diclofenac 50 mg twice a day   You have received a steroid shot. 15% of patients experience increased pain at the injection site with in the next 24 hours. This is best treated with ice and tylenol extra strength 2 tabs every 8 hours. If you are still having pain please call the office.

## 2012-10-03 ENCOUNTER — Encounter: Payer: Self-pay | Admitting: Orthopedic Surgery

## 2012-10-03 NOTE — Progress Notes (Signed)
Patient ID: [name], female   DOB: 1970-10-28, 42 y.o.   MRN: [id number]  Chief Complaint  Patient presents with  . Knee Pain    Right knee pain and swelling. Referred by Dr. Lilyan Punt    HPI: Chief complaint right knee pain and swelling in the prepatellar region for one to 2 months. Onset was gradual. The patient was given hydrocodone for severe pain she also took Advil and Aleve as well as BC powders with no relief. The knee feels tight. Her symptoms include sharp throbbing burning pain her pain level is 8-9/10. Her symptoms are constant they're worse after work, 12 hour shift. She says nothing makes it better standing on it for long periods of time make it worse. She does get relief from the Norco. She also describes a grinding rubbing sensation behind the knee. She indicates if she could just get under the knee cap and rub it it would feel better.  Review of systems after 14 systems were inquired about she reported positive findings of heartburn easy bleeding and bruising from aspirin and the musculoskeletal symptoms described all other systems were negative  Allergy of sulfa and penicillin  Surgery 2 cesarean sections  Medications include he total lack hydrocodone and megestrol  Family history heart disease lung disease diabetes cancer arthritis  She is divorced she works in [employer] she smokes she drinks socially she has an associates degree   Physical examination Vital signs BP 121/72  Ht 5\' 5"  (1.651 m)  Wt 184 lb (83.462 kg)  BMI 30.62 kg/m2  LMP 08/24/2012  General appearance: the patient is well-developed and well-nourished, grooming and hygiene are normal, body habitus BMI is 5 above normal  The patient is alert and oriented x 3; mood and affect are normal  Ambulatory status normal  Right knee Inspection she is tender in the peripatellar region the joint lines are nontender, there is crepitation and pain on patellar compression Range of motion  she has full passive range of motion and 120 active range of motion with pain The Lachman test is normal the anterior and posterior drawer tests are normal and the collateral ligaments are stable Motor exam 5/5 Skin normal; no rash or laceration  McMurray's sign negative  The left knee Inspection revealed no tenderness, however crepitation is noted in the patellofemoral joint, ROM was normal and motor exam grade 5/5 quad strength. 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 The surgical treatment for this is a chondroplasty with a complication usually of increased patellofemoral pain due to increase contact forces between the patella and the trochlea. She does not have tracking abnormality so reconstruction of the patellofemoral ligament is not needed or necessary  Follow up in 6 weeks

## 2012-11-01 ENCOUNTER — Encounter: Payer: Self-pay | Admitting: Nurse Practitioner

## 2012-11-01 ENCOUNTER — Ambulatory Visit (INDEPENDENT_AMBULATORY_CARE_PROVIDER_SITE_OTHER): Payer: Managed Care, Other (non HMO) | Admitting: Nurse Practitioner

## 2012-11-01 VITALS — BP 112/84 | Temp 98.5°F | Ht 66.0 in | Wt 181.0 lb

## 2012-11-01 DIAGNOSIS — G4440 Drug-induced headache, not elsewhere classified, not intractable: Secondary | ICD-10-CM

## 2012-11-01 DIAGNOSIS — K219 Gastro-esophageal reflux disease without esophagitis: Secondary | ICD-10-CM

## 2012-11-01 DIAGNOSIS — J069 Acute upper respiratory infection, unspecified: Secondary | ICD-10-CM

## 2012-11-01 DIAGNOSIS — T3995XA Adverse effect of unspecified nonopioid analgesic, antipyretic and antirheumatic, initial encounter: Secondary | ICD-10-CM

## 2012-11-01 MED ORDER — AZITHROMYCIN 250 MG PO TABS: ORAL_TABLET | ORAL | Status: DC

## 2012-11-01 MED ORDER — PANTOPRAZOLE SODIUM 40 MG PO TBEC: 40.0000 mg | DELAYED_RELEASE_TABLET | Freq: Every day | ORAL | Status: DC

## 2012-11-02 ENCOUNTER — Emergency Department (HOSPITAL_COMMUNITY)
Admission: EM | Admit: 2012-11-02 | Discharge: 2012-11-02 | Disposition: A | Discharge status: Home / Self Care | Payer: Managed Care, Other (non HMO) | Attending: Emergency Medicine | Admitting: Emergency Medicine

## 2012-11-02 ENCOUNTER — Encounter (HOSPITAL_COMMUNITY): Payer: Self-pay | Admitting: *Deleted

## 2012-11-02 DIAGNOSIS — Z9851 Tubal ligation status: Secondary | ICD-10-CM | POA: Insufficient documentation

## 2012-11-02 DIAGNOSIS — Z791 Long term (current) use of non-steroidal anti-inflammatories (NSAID): Secondary | ICD-10-CM | POA: Insufficient documentation

## 2012-11-02 DIAGNOSIS — K219 Gastro-esophageal reflux disease without esophagitis: Secondary | ICD-10-CM | POA: Insufficient documentation

## 2012-11-02 DIAGNOSIS — M549 Dorsalgia, unspecified: Secondary | ICD-10-CM | POA: Insufficient documentation

## 2012-11-02 DIAGNOSIS — R1013 Epigastric pain: Secondary | ICD-10-CM | POA: Insufficient documentation

## 2012-11-02 DIAGNOSIS — Z79899 Other long term (current) drug therapy: Secondary | ICD-10-CM | POA: Insufficient documentation

## 2012-11-02 DIAGNOSIS — F17200 Nicotine dependence, unspecified, uncomplicated: Secondary | ICD-10-CM | POA: Insufficient documentation

## 2012-11-02 DIAGNOSIS — Z880 Allergy status to penicillin: Secondary | ICD-10-CM | POA: Insufficient documentation

## 2012-11-02 DIAGNOSIS — G8929 Other chronic pain: Secondary | ICD-10-CM | POA: Insufficient documentation

## 2012-11-02 DIAGNOSIS — M25569 Pain in unspecified knee: Secondary | ICD-10-CM | POA: Insufficient documentation

## 2012-11-02 DIAGNOSIS — Z8742 Personal history of other diseases of the female genital tract: Secondary | ICD-10-CM | POA: Insufficient documentation

## 2012-11-02 DIAGNOSIS — M129 Arthropathy, unspecified: Secondary | ICD-10-CM | POA: Insufficient documentation

## 2012-11-02 HISTORY — DX: Unspecified osteoarthritis, unspecified site: M19.90

## 2012-11-02 LAB — HEPATIC FUNCTION PANEL
ALT: 22 U/L (ref 0–35)
AST: 16 U/L (ref 0–37)
Albumin: 4.2 g/dL (ref 3.5–5.2)
Alkaline Phosphatase: 74 U/L (ref 39–117)
Bilirubin, Direct: <0.1 mg/dL (ref 0.0–0.3)
Total Bilirubin: 0.3 mg/dL (ref 0.3–1.2)
Total Protein: 7.2 g/dL (ref 6.0–8.3)

## 2012-11-02 LAB — BASIC METABOLIC PANEL
BUN: 19 mg/dL (ref 6–23)
CO2: 23 mEq/L (ref 19–32)
Calcium: 9.7 mg/dL (ref 8.4–10.5)
Chloride: 105 mEq/L (ref 96–112)
Creatinine, Ser: 0.79 mg/dL (ref 0.50–1.10)
GFR calc Af Amer: >90 mL/min (ref >90)
GFR calc non Af Amer: >90 mL/min (ref >90)
Glucose, Bld: 108 mg/dL — ABNORMAL HIGH (ref 70–99)
Potassium: 4.2 mEq/L (ref 3.5–5.1)
Sodium: 138 mEq/L (ref 135–145)

## 2012-11-02 LAB — CBC
HCT: 41.9 % (ref 36.0–46.0)
Hemoglobin: 14.2 g/dL (ref 12.0–15.0)
MCH: 29.1 pg (ref 26.0–34.0)
MCHC: 33.9 g/dL (ref 30.0–36.0)
MCV: 85.9 fL (ref 78.0–100.0)
Platelets: 155 10*3/uL (ref 150–400)
RBC: 4.88 MIL/uL (ref 3.87–5.11)
RDW: 13.1 % (ref 11.5–15.5)
WBC: 10.4 10*3/uL (ref 4.0–10.5)

## 2012-11-02 LAB — TROPONIN I: Troponin I: <0.3 ng/mL (ref <0.30)

## 2012-11-02 LAB — LIPASE, BLOOD: Lipase: 51 U/L (ref 11–59)

## 2012-11-02 MED ORDER — ONDANSETRON HCL 4 MG/2ML IJ SOLN
4.0000 mg | Freq: Once | INTRAMUSCULAR | Status: AC
  Administered 2012-11-02: 4 mg via INTRAVENOUS
  Filled 2012-11-02: qty 2

## 2012-11-02 MED ORDER — ONDANSETRON 4 MG PO TBDP
4.0000 mg | ORAL_TABLET | Freq: Once | ORAL | Status: AC
  Administered 2012-11-02: 4 mg via ORAL

## 2012-11-02 MED ORDER — GI COCKTAIL ~~LOC~~
30.0000 mL | Freq: Once | ORAL | Status: AC
  Administered 2012-11-02: 30 mL via ORAL
  Filled 2012-11-02: qty 30

## 2012-11-02 MED ORDER — HYDROMORPHONE HCL PF 1 MG/ML IJ SOLN
1.0000 mg | Freq: Once | INTRAMUSCULAR | Status: AC
  Administered 2012-11-02: 1 mg via INTRAVENOUS
  Filled 2012-11-02: qty 1

## 2012-11-02 MED ORDER — LIDOCAINE 5 % EX PTCH: 1.0000 | MEDICATED_PATCH | CUTANEOUS | Status: DC

## 2012-11-02 MED ORDER — ONDANSETRON 4 MG PO TBDP
ORAL_TABLET | ORAL | Status: AC
  Filled 2012-11-02: qty 1

## 2012-11-02 MED ORDER — NITROGLYCERIN 0.4 MG SL SUBL
0.4000 mg | SUBLINGUAL_TABLET | Freq: Once | SUBLINGUAL | Status: AC
  Administered 2012-11-02: 0.4 mg via SUBLINGUAL
  Filled 2012-11-02: qty 25

## 2012-11-02 NOTE — ED Notes (Signed)
Patient complaining of pain to epigastric area and chest radiating into back. Reports took GI cocktail at home and has had no relief.

## 2012-11-02 NOTE — ED Provider Notes (Signed)
CSN: [id number]     Arrival date & time 11/02/12  0344 History   None    Chief Complaint  Patient presents with  . Abdominal Pain   (Consider location/radiation/quality/duration/timing/severity/associated sxs/prior Treatment) HPI History provided by patient. Severe and sharp epigastric pain that radiates to her back, onset a few days ago. She was evaluated by her primary care physician yesterday and started on Protonix. She was given a GI cocktail to take home. She has a history of reflux.  Last night at home medications as prescribed with no relief in symptoms. She presents here with 10/10 severe pain and no known alleviating factors. No chest pain otherwise. No shortness of breath. Had one episode of emesis prior to arrival without blood. No black or tarry stools. No blood in stools. Patient unaware of any aggravating factors. No history of gallbladder disease. Past Medical History  Diagnosis Date  . Chronic knee pain   . Chronic back pain   . Menorrhagia 09/09/2012  . Acid reflux   . Arthritis    Past Surgical History  Procedure Laterality Date  . Cesarean section    . Tubal ligation     Family History  Problem Relation Age of Onset  . Hypertension Mother   . Diabetes Mother   . Thyroid disease Mother   . COPD Mother   . Arthritis Mother   . Depression Mother   . Mental illness Mother   . Stroke Mother   . Cancer Father     colon  . Diabetes Maternal Grandmother   . Diabetes Paternal Grandmother   . Cancer Sister   . Cancer Brother     intestine/stomach  . Heart attack Brother   . Diabetes Sister   . Depression Sister   . Mental illness Sister    History  Substance Use Topics  . Smoking status: Current Every Day Smoker -- 1.00 packs/day for 4 years    Types: Cigarettes  . Smokeless tobacco: Never Used  . Alcohol Use: No     Comment: socially    OB History   Grav Para Term Preterm Abortions TAB SAB Ect Mult Living   2 2       2 4      Review of Systems   Constitutional: Negative for fever and chills.  HENT: Negative for neck pain and neck stiffness.   Eyes: Negative for pain.  Respiratory: Negative for shortness of breath.   Cardiovascular: Negative for chest pain.  Gastrointestinal: Positive for abdominal pain. Negative for blood in stool.  Genitourinary: Negative for dysuria.  Musculoskeletal: Negative for back pain.  Skin: Negative for rash.  Neurological: Negative for headaches.  All other systems reviewed and are negative.    Allergies  Penicillins; Sulfa antibiotics; and Xanax  Home Medications   Current Outpatient Rx  Name  Route  Sig  Dispense  Refill  . Aspirin-Salicylamide-Caffeine (BC HEADACHE POWDER PO)   Oral   Take 1 packet by mouth 2 (two) times daily as needed (for headache).         Marland Kitchen azithromycin (ZITHROMAX Z-PAK) 250 MG tablet      Take 2 tablets (500 mg) on  Day 1,  followed by 1 tablet (250 mg) once daily on Days 2 through 5.   6 each   0   . diclofenac (CATAFLAM) 50 MG tablet   Oral   Take 1 tablet (50 mg total) by mouth 2 (two) times daily.   90 tablet   3   .  HYDROcodone-acetaminophen (NORCO/VICODIN) 5-325 MG per tablet   Oral   Take 1 tablet by mouth every 6 (six) hours as needed for pain.   30 tablet   0   . megestrol (MEGACE) 40 MG tablet      Start megace on day 5 of cycle this period and take 1 daily for next 3 months   40 tablet   3   . pantoprazole (PROTONIX) 40 MG tablet   Oral   Take 1 tablet (40 mg total) by mouth daily.   30 tablet   5    BP 147/111  Pulse 75  Temp(Src) 98.3 F (36.8 C) (Oral)  Resp 22  SpO2 98%  LMP 09/27/2012 Physical Exam  Constitutional: She is oriented to person, place, and time. She appears well-developed and well-nourished.  HENT:  Head: Normocephalic and atraumatic.  Eyes: EOM are normal. Pupils are equal, round, and reactive to light.  Neck: Neck supple.  Cardiovascular: Normal rate, regular rhythm and intact distal pulses.    Pulmonary/Chest: Effort normal and breath sounds normal. No respiratory distress. She exhibits no tenderness.  Abdominal: Soft. Bowel sounds are normal. She exhibits no distension. There is no rebound and no guarding.  Tender epigastric without abdominal tenderness otherwise. Negative Murphy sign. No CVA tenderness.  Musculoskeletal: Normal range of motion. She exhibits no edema.  Neurological: She is alert and oriented to person, place, and time.  Skin: Skin is warm and dry.    ED Course  Procedures (including critical care time) Labs Review Labs Reviewed - No data to display Imaging Review No results found.  Results for orders placed during the hospital encounter of 11/02/12  TROPONIN I      Result Value Range   Troponin I <0.30  <0.30 ng/mL  BASIC METABOLIC PANEL      Result Value Range   Sodium 138  135 - 145 mEq/L   Potassium 4.2  3.5 - 5.1 mEq/L   Chloride 105  96 - 112 mEq/L   CO2 23  19 - 32 mEq/L   Glucose, Bld 108 (*) 70 - 99 mg/dL   BUN 19  6 - 23 mg/dL   Creatinine, Ser 1.61  0.50 - 1.10 mg/dL   Calcium 9.7  8.4 - 09.6 mg/dL   GFR calc non Af Amer >90  >90 mL/min   GFR calc Af Amer >90  >90 mL/min  CBC      Result Value Range   WBC 10.4  4.0 - 10.5 K/uL   RBC 4.88  3.87 - 5.11 MIL/uL   Hemoglobin 14.2  12.0 - 15.0 g/dL   HCT 04.5  40.9 - 81.1 %   MCV 85.9  78.0 - 100.0 fL   MCH 29.1  26.0 - 34.0 pg   MCHC 33.9  30.0 - 36.0 g/dL   RDW 91.4  78.2 - 95.6 %   Platelets 155  150 - 400 K/uL  LIPASE, BLOOD      Result Value Range   Lipase 51  11 - 59 U/L  HEPATIC FUNCTION PANEL      Result Value Range   Total Protein 7.2  6.0 - 8.3 g/dL   Albumin 4.2  3.5 - 5.2 g/dL   AST 16  0 - 37 U/L   ALT 22  0 - 35 U/L   Alkaline Phosphatase 74  39 - 117 U/L   Total Bilirubin 0.3  0.3 - 1.2 mg/dL   Bilirubin, Direct <2.1  0.0 - 0.3  mg/dL   Indirect Bilirubin NOT CALCULATED  0.3 - 0.9 mg/dL   4:54 AM recheck pain significantly improved. Patient requesting discharge  home   Date: 11/02/2012  Rate: 56  Rhythm: sinus bradycardia  QRS Axis: normal  Intervals: normal  ST/T Wave abnormalities: nonspecific ST changes  Conduction Disutrbances:none  Narrative Interpretation:   Old EKG Reviewed: no sig changes  IV Dilaudid. IV Zofran. GI cocktail.  Plan continue Protonix, clear liquids next 24 hours and followup with primary care physician as scheduled. Avoid NSAIDs. GERD precautions provided.  Patient concerned about not taking NSAIDs for her arthritis in her knee. Her prescription in for Lidoderm patches provided MDM  Diagnosis: Epigastric pain history of reflux.  EKG, labs obtained and reviewed as above IV narcotics, Zofran and GI cocktail provided with improved condition Vital Signs and nursing notes reviewed and considered    Sunnie Nielsen, MD 11/02/12 [phone]

## 2012-11-02 NOTE — ED Notes (Addendum)
Pt reporting epigastric pain.  Was seen at physician today and diagnosed with acid reflux, was prescribed GI cocktail and protonix.  No relief from either med.  Pt anxious and pacing in room.

## 2012-11-03 ENCOUNTER — Encounter: Payer: Self-pay | Admitting: Nurse Practitioner

## 2012-11-03 DIAGNOSIS — K219 Gastro-esophageal reflux disease without esophagitis: Secondary | ICD-10-CM | POA: Insufficient documentation

## 2012-11-03 DIAGNOSIS — G4440 Drug-induced headache, not elsewhere classified, not intractable: Secondary | ICD-10-CM | POA: Insufficient documentation

## 2012-11-03 NOTE — Progress Notes (Signed)
Subjective:  Presents for complaints of a flareup of her reflux symptoms. Was seen in the local ER on 6/18 for severe GERD, had a complete negative cardiac workup. Was given IV medication and a GI cocktail, symptoms resolved until a week ago, has had 3 episodes since then. Each episode occurs between 4 to 5:00 in the morning as she is leaving work, works night shift. Eats her supper around 10 PM. Describes as a tightness/pain in the epigastric area lasting 2-3 hours. No relief with OTC famotidine. No relief with Gas-X or TUMS. Pain will occasionally radiate straight to the back area. Some nausea/vomiting. No fever. No constipation diarrhea or change in the color of her stools. Smokes one pack per day. Rare social alcohol. Takes diclofenac every day for her knee pain. Takes up to 3 BC powders every day for headache. Drinks a large amount of caffeine and energy drinks. Mild head congestion for the past 5-6 days. No cough or sore throat. Began producing green mucus yesterday. Patient plans to go on a cruise next week.  Objective:   BP 112/84  Temp(Src) 98.5 F (36.9 C)  Ht 5\' 6"  (1.676 m)  Wt 181 lb (82.101 kg)  BMI 29.23 kg/m2 NAD. Alert, oriented. TMs clear effusion, no erythema. Pharynx injected with PND noted. Neck supple with mild soft nontender adenopathy. Lungs clear. Heart regular rate rhythm. Abdomen soft nondistended with minimal epigastric area tenderness.  Assessment:GERD (gastroesophageal reflux disease)  Analgesic overuse headache  Acute upper respiratory infections of unspecified site  Plan: Meds ordered this encounter  Medications  . pantoprazole (PROTONIX) 40 MG tablet    Sig: Take 1 tablet (40 mg total) by mouth daily.    Dispense:  30 tablet    Refill:  5    Order Specific Question:  Supervising Provider    Answer:  Merlyn Albert [[id number]]  . azithromycin (ZITHROMAX Z-PAK) 250 MG tablet    Sig: Take 2 tablets (500 mg) on  Day 1,  followed by 1 tablet (250 mg) once daily on  Days 2 through 5.    Dispense:  6 each    Refill:  0    Order Specific Question:  Supervising Provider    Answer:  Merlyn Albert [[id number]]   recommend patient to slowly wean herself off of caffeine. Decrease or stop smoking. Use as little analgesics as possible. Since patient is going out of town next week, recommend followup after this to discuss intervention for analgesic-induced/rebound headaches. Warning signs reviewed. Given prescription for GI cocktail as directed. Call or go to ER if symptoms worsen.

## 2012-11-03 NOTE — Assessment & Plan Note (Signed)
recommend patient to slowly wean herself off of caffeine. Decrease or stop smoking. Use as little analgesics as possible. Since patient is going out of town next week, recommend followup after this to discuss intervention for analgesic-induced/rebound headaches. Warning signs reviewed. Given prescription for GI cocktail as directed. Call or go to ER if symptoms worsen. 

## 2012-11-03 NOTE — Assessment & Plan Note (Signed)
recommend patient to slowly wean herself off of caffeine. Decrease or stop smoking. Use as little analgesics as possible. Since patient is going out of town next week, recommend followup after this to discuss intervention for analgesic-induced/rebound headaches. Warning signs reviewed. Given prescription for GI cocktail as directed. Call or go to ER if symptoms worsen.

## 2012-11-11 ENCOUNTER — Other Ambulatory Visit: Payer: Self-pay | Admitting: Nurse Practitioner

## 2012-11-11 ENCOUNTER — Telehealth: Payer: Self-pay | Admitting: Nurse Practitioner

## 2012-11-11 MED ORDER — FLUCONAZOLE 150 MG PO TABS: ORAL_TABLET | ORAL | Status: DC

## 2012-11-11 NOTE — Telephone Encounter (Signed)
Patient says that she is having a burning, itchy feeling, not constant in her private area with a tinge of pink when she wipes. She was given a zpack a week ago and has been on acid reflux meds IV, along with a pill. Can we call her something in for this?  [facility] in [place]

## 2012-11-11 NOTE — Telephone Encounter (Signed)
Patient notified

## 2012-11-11 NOTE — Telephone Encounter (Signed)
Diflucan called in; call back if symptoms persist.

## 2012-11-14 ENCOUNTER — Encounter: Payer: Self-pay | Admitting: Orthopedic Surgery

## 2012-11-14 ENCOUNTER — Ambulatory Visit: Payer: Managed Care, Other (non HMO) | Admitting: Orthopedic Surgery

## 2012-11-21 ENCOUNTER — Ambulatory Visit (INDEPENDENT_AMBULATORY_CARE_PROVIDER_SITE_OTHER): Payer: Managed Care, Other (non HMO) | Admitting: Nurse Practitioner

## 2012-11-21 VITALS — BP 118/76 | Ht 65.0 in | Wt 179.0 lb

## 2012-11-21 DIAGNOSIS — K2970 Gastritis, unspecified, without bleeding: Secondary | ICD-10-CM

## 2012-11-21 DIAGNOSIS — K219 Gastro-esophageal reflux disease without esophagitis: Secondary | ICD-10-CM

## 2012-11-21 MED ORDER — HYDROMORPHONE HCL 2 MG PO TABS: 2.0000 mg | ORAL_TABLET | ORAL | Status: DC | PRN

## 2012-11-21 MED ORDER — ONDANSETRON 8 MG PO TBDP: 8.0000 mg | ORAL_TABLET | Freq: Three times a day (TID) | ORAL | Status: DC | PRN

## 2012-11-21 MED ORDER — PANTOPRAZOLE SODIUM 40 MG PO TBEC: 40.0000 mg | DELAYED_RELEASE_TABLET | Freq: Two times a day (BID) | ORAL | Status: DC

## 2012-11-22 ENCOUNTER — Encounter: Payer: Self-pay | Admitting: Gastroenterology

## 2012-11-22 ENCOUNTER — Encounter: Payer: Self-pay | Admitting: Nurse Practitioner

## 2012-11-22 LAB — H. PYLORI ANTIBODY, IGG: H Pylori IgG: 0.41 {ISR}

## 2012-11-22 NOTE — Progress Notes (Signed)
Subjective:  Presents for recheck of her reflux-see previous note. Patient continues to have very significant problems with reflux and epigastric pain. Only has nausea and vomiting with prolonged pain if no relief. Has been to the emergency room, given Dilaudid IV and GI cocktail which relieved her symptoms temporarily. Also had to have medical visits while on her cruise. Since last visit, patient has cut back on her caffeine intake. Has only had 2 BC powders since her last visit (patient was taking at least 2-3 per day). Has increased her Protonix to twice a day recently. Symptoms are now occurring at different times, was occurring only in the mornings now occurs in the evenings as well. Having symptoms just about every day. Pain is very intense up into the chest area when it occurs. Will occasionally radiate straight into the back area. No right upper quadrant pain. Unassociated with any particular foods, can occur after eating a healthy meal. Note she has a history of chronic BC powder and anti-inflammatory use. Has also cut back on her diclofenac use for her knee pain.  Objective:   BP 118/76  Ht 5\' 5"  (1.651 m)  Wt 179 lb (81.194 kg)  BMI 29.79 kg/m2  LMP 09/27/2012 NAD. Alert, oriented. Lungs clear. Heart regular rate rhythm. Abdomen soft nondistended with minimal epigastric area tenderness, otherwise no abdominal tenderness. No rebound or guarding. No right upper quadrant tenderness.  Assessment:Unspecified gastritis and gastroduodenitis without mention of hemorrhage - Plan: H. pylori antibody, IgG  GERD (gastroesophageal reflux disease)  Plan: Referral to GI specialist as soon as possible. Increase Protonix 40 mg to twice a day. Given prescription for GI cocktail 3 times a day when necessary. Given prescription for for Zofran. Dilaudid 2 mg 1 by mouth every 4 hours when necessary intense pain (30 no refills). Warning signs reviewed including signs of GI bleeding. Call back or go to ER if  symptoms worsen.

## 2012-11-22 NOTE — Assessment & Plan Note (Signed)
Has cut back her BC powder use from 2-3 times per day to maybe once per week.

## 2012-11-22 NOTE — Assessment & Plan Note (Signed)
Plan: Referral to GI specialist as soon as possible. Increase Protonix 40 mg to twice a day. Given prescription for GI cocktail 3 times a day when necessary. Given prescription for for Zofran. Dilaudid 2 mg 1 by mouth every 4 hours when necessary intense pain (30 no refills). Warning signs reviewed including signs of GI bleeding. Call back or go to ER if symptoms worsen.

## 2012-11-26 NOTE — Progress Notes (Signed)
Spoke with patient regarding: H pylori test negative. Will proceed with referral as planned. No antibiotics indicated at this time. Appt scheduled for referral on 10/21 @ 2pm. Pt verbalized understanding

## 2012-12-04 ENCOUNTER — Telehealth: Payer: Self-pay | Admitting: Family Medicine

## 2012-12-04 NOTE — Telephone Encounter (Signed)
Mother scheduled office visit for her daughters to discuss birth control.

## 2012-12-04 NOTE — Telephone Encounter (Signed)
If this is the first time they have been on birth control, needs office visit.

## 2012-12-04 NOTE — Telephone Encounter (Signed)
Telephone call no answer 

## 2012-12-04 NOTE — Telephone Encounter (Signed)
Mom spoke with Eber Jones back in January about putting [name] on birthcontrol due to their heavy periods. They have decided that they are ready to on birthcontrol pills would like you to write them a prescription for it and she will come by and pick it up.or would they need to be seen.

## 2012-12-17 ENCOUNTER — Encounter (INDEPENDENT_AMBULATORY_CARE_PROVIDER_SITE_OTHER): Payer: Self-pay

## 2012-12-17 ENCOUNTER — Ambulatory Visit (INDEPENDENT_AMBULATORY_CARE_PROVIDER_SITE_OTHER): Payer: Managed Care, Other (non HMO) | Admitting: Gastroenterology

## 2012-12-17 ENCOUNTER — Other Ambulatory Visit: Payer: Self-pay | Admitting: Gastroenterology

## 2012-12-17 ENCOUNTER — Encounter: Payer: Self-pay | Admitting: Gastroenterology

## 2012-12-17 VITALS — BP 130/89 | HR 85 | Temp 98.6°F | Ht 65.0 in | Wt 177.2 lb

## 2012-12-17 DIAGNOSIS — Z800 Family history of malignant neoplasm of digestive organs: Secondary | ICD-10-CM

## 2012-12-17 DIAGNOSIS — R1013 Epigastric pain: Secondary | ICD-10-CM

## 2012-12-17 DIAGNOSIS — K3189 Other diseases of stomach and duodenum: Secondary | ICD-10-CM

## 2012-12-17 NOTE — Progress Notes (Signed)
Referring Provider: Campbell Riches, NP Primary Care Physician:  Lilyan Punt, MD Primary Gastroenterologist:  Dr. Darrick Penna   Chief Complaint  Patient presents with  . Gastrophageal Reflux    HPI:   Sarah 42 year old [name] presents today at the request of [name], NP/Dr. Lorin Picket Luking secondary to dyspepsia. Onset in summer. Works 4p-4a, episodes used to happen early in the morning, now increased in frequency. Epigastric pain, radiates through to back. Starts with pressure, can tell when it comes on. If worsens, will result in n/v. Nothing relieves except Norco. Protonix increased to BID. Pain intermittent. Will avoid food during episode. Has to get up and walk around when pain occurs, laying worsens. Used to take [facility] powders, at most 3 a day, now weaned down to every now and then. Decreased overall intake. Weight loss of about 10 lbs. Sometimes feels like food just stops in cervical esophagus. Denies out and out esophageal dysphagia. No melena, no rectal bleeding. No changes in bowel habits. Gallbladder remains in situ.   Past Medical History  Diagnosis Date  . Chronic knee pain   . Chronic back pain   . Menorrhagia 09/09/2012  . Acid reflux   . Arthritis     Past Surgical History  Procedure Laterality Date  . Cesarean section      X2   . Tubal ligation      Current Outpatient Prescriptions  Medication Sig Dispense Refill  . diclofenac (CATAFLAM) 50 MG tablet       . HYDROcodone-acetaminophen (NORCO/VICODIN) 5-325 MG per tablet Take 1 tablet by mouth every 6 (six) hours as needed.       . megestrol (MEGACE) 40 MG tablet Start megace on day 5 of cycle this period and take 1 daily for next 3 months  40 tablet  3  . ondansetron (ZOFRAN-ODT) 8 MG disintegrating tablet Take 1 tablet (8 mg total) by mouth every 8 (eight) hours as needed for nausea.  30 tablet  0  . pantoprazole (PROTONIX) 40 MG tablet Take 1 tablet (40 mg total) by mouth 2 (two) times daily.  60 tablet  5    No current facility-administered medications for this visit.    Allergies as of 12/17/2012 - Review Complete 12/17/2012  Allergen Reaction Noted  . Penicillins Nausea And Vomiting 06/28/2012  . Sulfa antibiotics  06/28/2012  . Xanax [alprazolam]  06/28/2012    Family History  Problem Relation Age of Onset  . Hypertension Mother   . Diabetes Mother   . Thyroid disease Mother   . COPD Mother   . Arthritis Mother   . Depression Mother   . Mental illness Mother   . Stroke Mother   . Prostate cancer Father     metastasized to colon  . Diabetes Maternal Grandmother   . Diabetes Paternal Grandmother   . Cancer Sister   . Colon cancer Brother     died at age 71  . Heart attack Brother   . Diabetes Sister   . Depression Sister   . Mental illness Sister     History   Social History  . Marital Status: Divorced    Spouse Name: N/A    Number of Children: N/A  . Years of Education: N/A   Occupational History  . Not on file.   Social History Main Topics  . Smoking status: Current Every Day Smoker -- 1.00 packs/day for 4 years    Types: Cigarettes  . Smokeless tobacco: Never Used  . Alcohol Use:  No     Comment: socially   . Drug Use: No  . Sexual Activity: Yes    Birth Control/ Protection: Surgical   Other Topics Concern  . Not on file   Social History Narrative  . No narrative on file    Review of Systems: Gen: see HPI CV: Denies chest pain, heart palpitations, syncope, peripheral edema. Resp: Denies shortness of breath with rest, cough, wheezing GI: see HPI GU : Denies urinary burning, urinary frequency, urinary incontinence.  MS: +joint pain  Derm: Denies rash, itching, dry skin Psych: Denies depression, anxiety, confusion or memory loss  Heme: Denies bruising, bleeding, and enlarged lymph nodes.  Physical Exam: BP 130/89  Pulse 85  Temp(Src) 98.6 F (37 C) (Oral)  Ht 5\' 5"  (1.651 m)  Wt 177 lb 3.2 oz (80.377 kg)  BMI 29.49 kg/m2  LMP  09/16/2012 General:   Alert and oriented. Well-developed, well-nourished, Sarah and cooperative. Head:  Normocephalic and atraumatic. Eyes:  Conjunctiva pink, sclera clear, no icterus.   Conjunctiva pink. Ears:  Normal auditory acuity. Nose:  No deformity, discharge,  or lesions. Mouth:  No deformity or lesions, mucosa pink and moist.  Neck:  Supple, without mass or thyromegaly. Lungs:  Clear to auscultation bilaterally, without wheezing, rales, or rhonchi.  Heart:  S1, S2 present without murmurs noted.  Abdomen:  +BS, soft, TTP epigastric and non-distended. Without mass or HSM. No rebound or guarding. No hernias noted. Rectal:  Deferred  Msk:  Symmetrical without gross deformities. Normal posture. Extremities:  Without clubbing or edema. Neurologic:  Alert and  oriented x4;  grossly normal neurologically. Skin:  Intact, warm and dry without significant lesions or rashes Cervical Nodes:  No significant cervical adenopathy. Psych:  Alert and cooperative. Normal mood and affect.  Lab Results  Component Value Date   WBC 10.4 11/02/2012   HGB 14.2 11/02/2012   HCT 41.9 11/02/2012   MCV 85.9 11/02/2012   PLT 155 11/02/2012   Lab Results  Component Value Date   ALT 22 11/02/2012   AST 16 11/02/2012   ALKPHOS 74 11/02/2012   BILITOT 0.3 11/02/2012   Lab Results  Component Value Date   LIPASE 51 11/02/2012

## 2012-12-17 NOTE — Patient Instructions (Signed)
We have scheduled you for an upper endoscopy with Dr. Darrick Penna in the near future.  We may need to set you up for an ultrasound of your belly if the endoscopy is negative.   Further recommendations to follow!

## 2012-12-18 ENCOUNTER — Encounter (HOSPITAL_COMMUNITY): Payer: Self-pay | Admitting: Pharmacy Technician

## 2012-12-19 DIAGNOSIS — Z800 Family history of malignant neoplasm of digestive organs: Secondary | ICD-10-CM | POA: Insufficient documentation

## 2012-12-19 NOTE — Assessment & Plan Note (Signed)
42 year old female with new-onset dyspepsia in the setting of routine aspirin powder use, concern for gastritis, PUD. Symptoms intermittent in nature with associated nausea and vomiting. No signs of overt GI bleeding such as melena, hematochezia. No improvement despite PPI BID. Also notable, vague symptoms of food "stopping" in cervical esophagus but no real complaints of dysphagia.   Recommend EGD with dilation if appropriate with Dr. Darrick Penna in near future. Risks and benefits discussed in detail with stated understanding.  If EGD negative, proceed with [place] of abdomen, as gallbladder remains in situ. Unable to rule out biliary component.  Continue Protonix BID.  STOP GOODY'S POWDERS

## 2012-12-19 NOTE — Assessment & Plan Note (Signed)
Brother diagnosed at age 44, deceased. Once current symptoms improved, discuss elective colonoscopy in near future.

## 2012-12-19 NOTE — Progress Notes (Signed)
cc'd to pcp 

## 2012-12-23 ENCOUNTER — Ambulatory Visit: Payer: Managed Care, Other (non HMO) | Admitting: Adult Health

## 2012-12-27 ENCOUNTER — Encounter (HOSPITAL_COMMUNITY): Payer: Self-pay | Admitting: *Deleted

## 2012-12-27 ENCOUNTER — Encounter (HOSPITAL_COMMUNITY)
Admission: RE | Disposition: A | Discharge status: Home / Self Care | Payer: Self-pay | Source: Ambulatory Visit | Attending: Gastroenterology

## 2012-12-27 ENCOUNTER — Ambulatory Visit (HOSPITAL_COMMUNITY)
Admission: RE | Admit: 2012-12-27 | Discharge: 2012-12-27 | Disposition: A | Discharge status: Home / Self Care | Payer: Managed Care, Other (non HMO) | Source: Ambulatory Visit | Attending: Gastroenterology | Admitting: Gastroenterology

## 2012-12-27 DIAGNOSIS — K222 Esophageal obstruction: Secondary | ICD-10-CM

## 2012-12-27 DIAGNOSIS — R1013 Epigastric pain: Secondary | ICD-10-CM

## 2012-12-27 DIAGNOSIS — K3189 Other diseases of stomach and duodenum: Secondary | ICD-10-CM | POA: Insufficient documentation

## 2012-12-27 DIAGNOSIS — K2990 Gastroduodenitis, unspecified, without bleeding: Secondary | ICD-10-CM

## 2012-12-27 DIAGNOSIS — K2940 Chronic atrophic gastritis without bleeding: Secondary | ICD-10-CM | POA: Insufficient documentation

## 2012-12-27 DIAGNOSIS — R1310 Dysphagia, unspecified: Secondary | ICD-10-CM

## 2012-12-27 DIAGNOSIS — K2970 Gastritis, unspecified, without bleeding: Secondary | ICD-10-CM

## 2012-12-27 HISTORY — DX: Family history of other specified conditions: Z84.89

## 2012-12-27 HISTORY — PX: ESOPHAGOGASTRODUODENOSCOPY: SHX5428

## 2012-12-27 SURGERY — EGD (ESOPHAGOGASTRODUODENOSCOPY)
Anesthesia: Moderate Sedation

## 2012-12-27 MED ORDER — MIDAZOLAM HCL 5 MG/5ML IJ SOLN
INTRAMUSCULAR | Status: AC
  Filled 2012-12-27: qty 10

## 2012-12-27 MED ORDER — SODIUM CHLORIDE 0.9 % IJ SOLN
INTRAMUSCULAR | Status: AC
  Filled 2012-12-27: qty 10

## 2012-12-27 MED ORDER — MEPERIDINE HCL 100 MG/ML IJ SOLN
INTRAMUSCULAR | Status: AC
  Filled 2012-12-27: qty 2

## 2012-12-27 MED ORDER — PROMETHAZINE HCL 25 MG/ML IJ SOLN
INTRAMUSCULAR | Status: AC
  Filled 2012-12-27: qty 1

## 2012-12-27 MED ORDER — PROMETHAZINE HCL 25 MG/ML IJ SOLN
INTRAMUSCULAR | Status: DC | PRN
  Administered 2012-12-27: 12.5 mg via INTRAVENOUS

## 2012-12-27 MED ORDER — STERILE WATER FOR IRRIGATION IR SOLN
Status: DC | PRN
  Administered 2012-12-27: 15:00:00

## 2012-12-27 MED ORDER — SODIUM CHLORIDE 0.9 % IV SOLN
INTRAVENOUS | Status: DC
  Administered 2012-12-27: 1000 mL via INTRAVENOUS

## 2012-12-27 MED ORDER — MINERAL OIL PO OIL
TOPICAL_OIL | ORAL | Status: AC
  Filled 2012-12-27: qty 30

## 2012-12-27 MED ORDER — MIDAZOLAM HCL 5 MG/5ML IJ SOLN
INTRAMUSCULAR | Status: DC | PRN
  Administered 2012-12-27 (×2): 2 mg via INTRAVENOUS
  Administered 2012-12-27: 1 mg via INTRAVENOUS
  Administered 2012-12-27: 2 mg via INTRAVENOUS

## 2012-12-27 MED ORDER — BUTAMBEN-TETRACAINE-BENZOCAINE 2-2-14 % EX AERO
INHALATION_SPRAY | CUTANEOUS | Status: DC | PRN
  Administered 2012-12-27: 2 via TOPICAL

## 2012-12-27 MED ORDER — MEPERIDINE HCL 100 MG/ML IJ SOLN
INTRAMUSCULAR | Status: DC | PRN
  Administered 2012-12-27 (×2): 50 mg via INTRAVENOUS

## 2012-12-27 NOTE — Progress Notes (Signed)
REVIEWED. Sx atypical for biliary colic. Sx most likely due to GERD.

## 2012-12-27 NOTE — H&P (Signed)
Primary Care Physician:  Lilyan Punt, MD Primary Gastroenterologist:  Dr. Darrick Penna  Pre-Procedure History & Physical: HPI:  [name] is a 42 y.o. female here for DYSPEPSIA.  Past Medical History  Diagnosis Date  . Chronic knee pain   . Chronic back pain   . Menorrhagia 09/09/2012  . Acid reflux   . Family history of anesthesia complication     Mother woke up; Heard and felt could not speak  . Arthritis     Knee    Past Surgical History  Procedure Laterality Date  . Cesarean section      X2   . Tubal ligation      Prior to Admission medications   Medication Sig Start Date End Date Taking? Authorizing Provider  diphenhydrAMINE (SOMINEX) 25 MG tablet Take 25 mg by mouth at bedtime as needed for allergies or sleep.   Yes Historical Provider, MD  HYDROmorphone (DILAUDID) 2 MG tablet Take 2 mg by mouth every 4 (four) hours as needed for pain.   Yes Historical Provider, MD  ibuprofen (ADVIL,MOTRIN) 200 MG tablet Take 800 mg by mouth every 6 (six) hours as needed for pain.   Yes Historical Provider, MD  lidocaine (LIDODERM) 5 % Place 1 patch onto the skin daily. Remove & Discard patch within 12 hours or as directed by MD   Yes Historical Provider, MD  megestrol (MEGACE) 40 MG tablet Start megace on day 5 of cycle this period and take 1 daily for next 3 months 09/20/12  Yes Adline Potter, NP  ondansetron (ZOFRAN-ODT) 8 MG disintegrating tablet Take 1 tablet (8 mg total) by mouth every 8 (eight) hours as needed for nausea. 11/21/12  Yes Campbell Riches, NP  pantoprazole (PROTONIX) 40 MG tablet Take 1 tablet (40 mg total) by mouth 2 (two) times daily. 11/21/12  Yes Campbell Riches, NP  naproxen sodium (ALEVE) 220 MG tablet Take 220 mg by mouth daily as needed (pain).    Historical Provider, MD    Allergies as of 12/17/2012 - Review Complete 12/17/2012  Allergen Reaction Noted  . Penicillins Nausea And Vomiting 06/28/2012  . Sulfa antibiotics  06/28/2012  . Xanax [alprazolam]   06/28/2012    Family History  Problem Relation Age of Onset  . Hypertension Mother   . Diabetes Mother   . Thyroid disease Mother   . COPD Mother   . Arthritis Mother   . Depression Mother   . Mental illness Mother   . Stroke Mother   . Prostate cancer Father     metastasized to colon  . Diabetes Maternal Grandmother   . Diabetes Paternal Grandmother   . Cancer Sister   . Colon cancer Brother     died at age 43  . Heart attack Brother   . Diabetes Sister   . Depression Sister   . Mental illness Sister     History   Social History  . Marital Status: Divorced    Spouse Name: N/A    Number of Children: N/A  . Years of Education: N/A   Occupational History  . Not on file.   Social History Main Topics  . Smoking status: Current Every Day Smoker -- 1.00 packs/day for 4 years    Types: Cigarettes  . Smokeless tobacco: Never Used  . Alcohol Use: Yes     Comment: socially   . Drug Use: No  . Sexual Activity: Yes    Birth Control/ Protection: Surgical   Other Topics Concern  .  Not on file   Social History Narrative  . No narrative on file    Review of Systems: See HPI, otherwise negative ROS   Physical Exam: BP 123/87  Pulse 87  Temp(Src) 98.6 F (37 C) (Oral)  Resp 13  SpO2 98%  LMP 09/16/2012 General:   Alert,  pleasant and cooperative in NAD Head:  Normocephalic and atraumatic. Neck:  Supple; Lungs:  Clear throughout to auscultation.    Heart:  Regular rate and rhythm. Abdomen:  Soft, nontender and nondistended. Normal bowel sounds, without guarding, and without rebound.   Neurologic:  Alert and  oriented x4;  grossly normal neurologically.  Impression/Plan:     DYSPEPSIA  PLAN:  EGD TODAY

## 2012-12-27 NOTE — Op Note (Signed)
[facility] [address] [place], [address]   ENDOSCOPY PROCEDURE REPORT  PATIENT: [name], [name]  MR#: [id number] BIRTHDATE: 1970-03-08 , 42  yrs. old GENDER: Female  ENDOSCOPIST: Jonette Eva, MD REFFERED HQ:IONGE Gerda Diss, M.D.  PROCEDURE DATE:  12/27/2012 PROCEDURE:   EGD with biopsy and EGD with dilatation over guidewire   INDICATIONS:1.  dysphagia.   2.  dyspepsia. MEDICATIONS: Demerol 100 mg IV and Versed 8 mg IV TOPICAL ANESTHETIC: Cetacaine Spray  DESCRIPTION OF PROCEDURE:   After the risks benefits and alternatives of the procedure were thoroughly explained, informed consent was obtained.  The EG-[id number] ([id number])  endoscope was introduced through the mouth and advanced to the second portion of the duodenum. The instrument was slowly withdrawn as the mucosa was carefully examined.  Prior to withdrawal of the scope, the guidwire was placed.  The esophagus was dilated successfully.  The patient was recovered in endoscopy and discharged home in satisfactory condition.   ESOPHAGUS: A stricture was found at the gastroesophageal junction. The stenosis was traversable with the endoscope. GE JXN 40 CM FROM THE TEETH.   Multiple biopsies were performed 20 & 35 CM FROM THE TEETH.  STOMACH: Mild non-erosive gastritis (inflammation) was found in the gastric antrum.  Multiple biopsies were performed using cold forceps.   DUODENUM: The duodenal mucosa showed no abnormalities in the bulb and second portion of the duodenum. Dilation was then performed at the gastroesphageal junction Dilator: Savary over guidewire Size(s): 14, 16 mm Resistance: minimal Heme: yes-SLIGHT  COMPLICATIONS: There were no complications.   ENDOSCOPIC IMPRESSION: 1.   Stricture at the gastroesophageal junction 2.   MILD Non-erosive gastritis  RECOMMENDATIONS: CONTINUE PROTONIX.  TAKE 30 MINUTES PRIOR TO meals TWICE DAILY FOLLOW A LOW FAT DIET. BIOPSY WILL BE BACK IN 7 DAYS.  FOLLOW  UP IN 3 MOS.      _______________________________ Rosalie DoctorJonette Eva, MD 12/27/2012 5:19 PM

## 2013-01-08 ENCOUNTER — Telehealth: Payer: Self-pay | Admitting: Family Medicine

## 2013-01-08 NOTE — Telephone Encounter (Signed)
Patients states she needs a refill on HYDROmorphone (DILAUDID) 2 MG tablet, however she states the tablet form does not work as good as the IV form of this medication.  She has to take 2 tablets for this to work.  States she does not like to take 2 at a time so if DR would like to change to something stronger that would be fine.  Patient states she had another attack this AM and no has no medication to take  On her way to work, leave a message on [employer]

## 2013-01-08 NOTE — Telephone Encounter (Signed)
Advised patient that doctor does not recommend pain med and needs to contact GI doctor. Patient stated she will call her GI doc and schedule follow up office visit.

## 2013-01-08 NOTE — Telephone Encounter (Signed)
No, I will not be prescribing. This needs GI reconsult to address what she can use when she gets the pain

## 2013-01-09 ENCOUNTER — Telehealth: Payer: Self-pay | Admitting: Gastroenterology

## 2013-01-09 NOTE — Telephone Encounter (Signed)
Please call pt. HER stomach Bx shows gastritis.  .THE ESOPHAGUS BIOPSIES ARE NORMAL.   CONTINUE PROTONIX. TAKE 30 MINUTES PRIOR TO MEALS TWICE DAILY. Low fat diet opv JAN 2014 e 30 EPIGASTRIC PAIN

## 2013-01-09 NOTE — Telephone Encounter (Signed)
Suspect pain secondary to gastritis. Would not advise narcotics. Avoid diclofenac and aspirin powders (ie BCs/Goodys). OK to schedule abd u/s (per Gerrit Halls, NP recommendation at time of OV few weeks ago.

## 2013-01-09 NOTE — Telephone Encounter (Signed)
Pt called for results. I gave them to her. She then said that she thought she was to have an [place], it was mention in her discharge papers. I told her I was not aware of that.   She said she is still having a lot of pin her in chest/breast area and at times it is severe. Said she does not have burning with it and doesn't feel that she has reflux.  She said she needs something for pain. She works 12 hour shifts and when she has a bad episode, she cannot leave work, or if she is trying to sleep, she needs her sleep to be able to work. She was getting Hydromorphone from [place] at [employer]. It last her from Sept til now. She called Dr. Cathlyn Parsons office yesterday and they said she would have to contact our office now since she has had procedure. I told her we do not prescribe pain meds.  She said that is ok, just fix whatever is wrong with her. She complained because she was supposed to get results back within 7 days of biopsy and did not. She complained because she got bills before she ever knew her results.   I kept listening and I told her that Dr. Darrick Penna is out of the office for a few days and she said that is fine. She just wants her to do something for her.   Routing to Tana Coast, PA in Dr. Evelina Dun absence.

## 2013-01-10 ENCOUNTER — Other Ambulatory Visit: Payer: Self-pay | Admitting: Gastroenterology

## 2013-01-10 DIAGNOSIS — R1013 Epigastric pain: Secondary | ICD-10-CM

## 2013-01-10 NOTE — Telephone Encounter (Signed)
Called and informed pt. Routing to LAW to schedule [place].

## 2013-01-10 NOTE — Telephone Encounter (Signed)
Patient is scheduled for Abd U/S Wednesday Nov 19th at 8:00 am and she is aware

## 2013-01-10 NOTE — Telephone Encounter (Signed)
Reminder in epic °

## 2013-01-12 ENCOUNTER — Emergency Department (HOSPITAL_COMMUNITY)
Admission: EM | Admit: 2013-01-12 | Discharge: 2013-01-13 | Disposition: A | Discharge status: Home / Self Care | Payer: Managed Care, Other (non HMO) | Attending: Emergency Medicine | Admitting: Emergency Medicine

## 2013-01-12 ENCOUNTER — Encounter (INDEPENDENT_AMBULATORY_CARE_PROVIDER_SITE_OTHER): Payer: Self-pay | Admitting: Internal Medicine

## 2013-01-12 ENCOUNTER — Telehealth (INDEPENDENT_AMBULATORY_CARE_PROVIDER_SITE_OTHER): Payer: Self-pay | Admitting: Internal Medicine

## 2013-01-12 ENCOUNTER — Encounter (HOSPITAL_COMMUNITY): Payer: Self-pay | Admitting: Emergency Medicine

## 2013-01-12 ENCOUNTER — Emergency Department (HOSPITAL_COMMUNITY): Payer: Managed Care, Other (non HMO)

## 2013-01-12 DIAGNOSIS — F17200 Nicotine dependence, unspecified, uncomplicated: Secondary | ICD-10-CM | POA: Insufficient documentation

## 2013-01-12 DIAGNOSIS — M1710 Unilateral primary osteoarthritis, unspecified knee: Secondary | ICD-10-CM | POA: Insufficient documentation

## 2013-01-12 DIAGNOSIS — K8020 Calculus of gallbladder without cholecystitis without obstruction: Secondary | ICD-10-CM | POA: Insufficient documentation

## 2013-01-12 DIAGNOSIS — Z79899 Other long term (current) drug therapy: Secondary | ICD-10-CM | POA: Insufficient documentation

## 2013-01-12 DIAGNOSIS — R110 Nausea: Secondary | ICD-10-CM | POA: Insufficient documentation

## 2013-01-12 DIAGNOSIS — Z8742 Personal history of other diseases of the female genital tract: Secondary | ICD-10-CM | POA: Insufficient documentation

## 2013-01-12 DIAGNOSIS — G8929 Other chronic pain: Secondary | ICD-10-CM | POA: Insufficient documentation

## 2013-01-12 DIAGNOSIS — Z880 Allergy status to penicillin: Secondary | ICD-10-CM | POA: Insufficient documentation

## 2013-01-12 DIAGNOSIS — K219 Gastro-esophageal reflux disease without esophagitis: Secondary | ICD-10-CM | POA: Insufficient documentation

## 2013-01-12 DIAGNOSIS — Z3202 Encounter for pregnancy test, result negative: Secondary | ICD-10-CM | POA: Insufficient documentation

## 2013-01-12 LAB — URINALYSIS, ROUTINE W REFLEX MICROSCOPIC
Bilirubin Urine: NEGATIVE
Glucose, UA: NEGATIVE mg/dL
Hgb urine dipstick: NEGATIVE
Ketones, ur: NEGATIVE mg/dL
Leukocytes, UA: NEGATIVE
Nitrite: NEGATIVE
Protein, ur: NEGATIVE mg/dL
Specific Gravity, Urine: <1.005 — ABNORMAL LOW (ref 1.005–1.030)
Urobilinogen, UA: 0.2 mg/dL (ref 0.0–1.0)
pH: 6 (ref 5.0–8.0)

## 2013-01-12 LAB — CBC WITH DIFFERENTIAL/PLATELET
Basophils Absolute: 0 10*3/uL (ref 0.0–0.1)
Basophils Relative: 0 % (ref 0–1)
Eosinophils Absolute: 0.1 10*3/uL (ref 0.0–0.7)
Eosinophils Relative: 2 % (ref 0–5)
HCT: 41.7 % (ref 36.0–46.0)
Hemoglobin: 14.3 g/dL (ref 12.0–15.0)
Lymphocytes Relative: 22 % (ref 12–46)
Lymphs Abs: 1.9 10*3/uL (ref 0.7–4.0)
MCH: 29.5 pg (ref 26.0–34.0)
MCHC: 34.3 g/dL (ref 30.0–36.0)
MCV: 86 fL (ref 78.0–100.0)
Monocytes Absolute: 0.6 10*3/uL (ref 0.1–1.0)
Monocytes Relative: 7 % (ref 3–12)
Neutro Abs: 6.2 10*3/uL (ref 1.7–7.7)
Neutrophils Relative %: 70 % (ref 43–77)
Platelets: 158 10*3/uL (ref 150–400)
RBC: 4.85 MIL/uL (ref 3.87–5.11)
RDW: 13.5 % (ref 11.5–15.5)
WBC: 8.9 10*3/uL (ref 4.0–10.5)

## 2013-01-12 LAB — COMPREHENSIVE METABOLIC PANEL
ALT: 18 U/L (ref 0–35)
AST: 16 U/L (ref 0–37)
Albumin: 4 g/dL (ref 3.5–5.2)
Alkaline Phosphatase: 72 U/L (ref 39–117)
BUN: 14 mg/dL (ref 6–23)
CO2: 23 mEq/L (ref 19–32)
Calcium: 9.6 mg/dL (ref 8.4–10.5)
Chloride: 103 mEq/L (ref 96–112)
Creatinine, Ser: 0.79 mg/dL (ref 0.50–1.10)
GFR calc Af Amer: >90 mL/min (ref >90)
GFR calc non Af Amer: >90 mL/min (ref >90)
Glucose, Bld: 94 mg/dL (ref 70–99)
Potassium: 3.7 mEq/L (ref 3.5–5.1)
Sodium: 136 mEq/L (ref 135–145)
Total Bilirubin: 0.2 mg/dL — ABNORMAL LOW (ref 0.3–1.2)
Total Protein: 7.5 g/dL (ref 6.0–8.3)

## 2013-01-12 LAB — PREGNANCY, URINE: Preg Test, Ur: NEGATIVE

## 2013-01-12 LAB — LIPASE, BLOOD: Lipase: 45 U/L (ref 11–59)

## 2013-01-12 LAB — TROPONIN I: Troponin I: <0.3 ng/mL (ref <0.30)

## 2013-01-12 MED ORDER — ONDANSETRON HCL 4 MG PO TABS: 4.0000 mg | ORAL_TABLET | Freq: Four times a day (QID) | ORAL | Status: DC

## 2013-01-12 MED ORDER — GI COCKTAIL ~~LOC~~
30.0000 mL | Freq: Once | ORAL | Status: AC
  Administered 2013-01-12: 30 mL via ORAL
  Filled 2013-01-12: qty 30

## 2013-01-12 MED ORDER — HYDROMORPHONE HCL PF 1 MG/ML IJ SOLN
1.0000 mg | Freq: Once | INTRAMUSCULAR | Status: AC
  Administered 2013-01-13: 1 mg via INTRAVENOUS
  Filled 2013-01-12: qty 1

## 2013-01-12 MED ORDER — OXYCODONE-ACETAMINOPHEN 5-325 MG PO TABS: 2.0000 | ORAL_TABLET | ORAL | Status: DC | PRN

## 2013-01-12 MED ORDER — IOHEXOL 300 MG/ML  SOLN
100.0000 mL | Freq: Once | INTRAMUSCULAR | Status: AC | PRN
  Administered 2013-01-12: 100 mL via INTRAVENOUS

## 2013-01-12 MED ORDER — SODIUM CHLORIDE 0.9 % IV BOLUS (SEPSIS)
1000.0000 mL | Freq: Once | INTRAVENOUS | Status: AC
  Administered 2013-01-12: 1000 mL via INTRAVENOUS

## 2013-01-12 MED ORDER — PROMETHAZINE HCL 25 MG PO TABS: 25.0000 mg | ORAL_TABLET | Freq: Four times a day (QID) | ORAL | Status: DC | PRN

## 2013-01-12 MED ORDER — ONDANSETRON HCL 4 MG/2ML IJ SOLN
4.0000 mg | Freq: Once | INTRAMUSCULAR | Status: AC
  Administered 2013-01-12: 4 mg via INTRAVENOUS
  Filled 2013-01-12: qty 2

## 2013-01-12 MED ORDER — ONDANSETRON HCL 4 MG/2ML IJ SOLN
4.0000 mg | Freq: Once | INTRAMUSCULAR | Status: AC
  Administered 2013-01-13: 4 mg via INTRAVENOUS
  Filled 2013-01-12: qty 2

## 2013-01-12 MED ORDER — PANTOPRAZOLE SODIUM 40 MG IV SOLR
40.0000 mg | Freq: Once | INTRAVENOUS | Status: AC
  Administered 2013-01-12: 40 mg via INTRAVENOUS
  Filled 2013-01-12: qty 40

## 2013-01-12 MED ORDER — HYDROMORPHONE HCL PF 1 MG/ML IJ SOLN
1.0000 mg | Freq: Once | INTRAMUSCULAR | Status: AC
  Administered 2013-01-12: 1 mg via INTRAVENOUS
  Filled 2013-01-12: qty 1

## 2013-01-12 MED ORDER — IOHEXOL 300 MG/ML  SOLN
50.0000 mL | Freq: Once | INTRAMUSCULAR | Status: AC | PRN
  Administered 2013-01-12: 50 mL via ORAL

## 2013-01-12 NOTE — ED Provider Notes (Signed)
CSN: [id number]     Arrival date & time 01/12/13  1924 History   First MD Initiated Contact with Patient 01/12/13 1934     Chief Complaint  Patient presents with  . Abdominal Pain  . Back Pain   (Consider location/radiation/quality/duration/timing/severity/associated sxs/prior Treatment) HPI Comments: Patient presents with a two-month history of epigastric pain it radiates to her back. This has been ongoing and evaluated by her PCP and GI. He normally improves with protonix and a GI cocktail but she is out of her GI cocktail. Pain has been ongoing since 3 PM today and ready to her back. Nothing makes it better or worse. She is nauseated but not vomiting. No change in bowel habits. No fevers, chills, urinary or vaginal symptoms. No chest pain or shortness of breath. She had an EGD on  October 31st that showed gastritis and esophageal stricture that was dilated. She is scheduled for an ultrasound of her gallbladder this week.   The history is provided by the patient.    Past Medical History  Diagnosis Date  . Chronic knee pain   . Chronic back pain   . Menorrhagia 09/09/2012  . Acid reflux   . Family history of anesthesia complication     Mother woke up; Heard and felt could not speak  . Arthritis     Knee   Past Surgical History  Procedure Laterality Date  . Cesarean section      X2   . Tubal ligation    . Esophagogastroduodenoscopy N/A 12/27/2012    Procedure: ESOPHAGOGASTRODUODENOSCOPY (EGD);  Surgeon: West Bali, MD;  Location: [facility];  Service: Endoscopy;  Laterality: N/A;  2:30-moved to 13:45 Soledad Gerlach notified pt   Family History  Problem Relation Age of Onset  . Hypertension Mother   . Diabetes Mother   . Thyroid disease Mother   . COPD Mother   . Arthritis Mother   . Depression Mother   . Mental illness Mother   . Stroke Mother   . Prostate cancer Father     metastasized to colon  . Diabetes Maternal Grandmother   . Diabetes Paternal Grandmother   .  Cancer Sister   . Colon cancer Brother     died at age 40  . Heart attack Brother   . Diabetes Sister   . Depression Sister   . Mental illness Sister    History  Substance Use Topics  . Smoking status: Current Every Day Smoker -- 1.00 packs/day for 4 years    Types: Cigarettes  . Smokeless tobacco: Never Used  . Alcohol Use: Yes     Comment: socially    OB History   Grav Para Term Preterm Abortions TAB SAB Ect Mult Living   2 2       2 4      Review of Systems  Constitutional: Positive for activity change and appetite change. Negative for fever and fatigue.  HENT: Negative for rhinorrhea.   Respiratory: Negative for cough, chest tightness and shortness of breath.   Cardiovascular: Negative for chest pain.  Gastrointestinal: Positive for nausea and abdominal pain. Negative for vomiting, diarrhea and constipation.  Genitourinary: Negative for dysuria, hematuria, vaginal bleeding and vaginal discharge.  Musculoskeletal: Positive for back pain.  Skin: Negative for rash.  Neurological: Negative for dizziness, weakness and headaches.  A complete 10 system review of systems was obtained and all systems are negative except as noted in the HPI and PMH.    Allergies  Penicillins;  Sulfa antibiotics; and Xanax  Home Medications   Current Outpatient Rx  Name  Route  Sig  Dispense  Refill  . Alum & Mag Hydroxide-Simeth (GI COCKTAIL) SUSP suspension   Oral   Take 20 mLs by mouth daily as needed for indigestion. Shake well.         . diphenhydrAMINE (SOMINEX) 25 MG tablet   Oral   Take 25 mg by mouth at bedtime as needed for allergies or sleep.         Marland Kitchen ibuprofen (ADVIL,MOTRIN) 200 MG tablet   Oral   Take 800 mg by mouth every 6 (six) hours as needed for pain.         Marland Kitchen lidocaine (LIDODERM) 5 %   Transdermal   Place 1 patch onto the skin daily. Remove & Discard patch within 12 hours or as directed by MD         . megestrol (MEGACE) 40 MG tablet   Oral   Take 40 mg  by mouth daily.         . pantoprazole (PROTONIX) 40 MG tablet   Oral   Take 1 tablet (40 mg total) by mouth 2 (two) times daily.   60 tablet   5   . HYDROmorphone (DILAUDID) 2 MG tablet   Oral   Take 2 mg by mouth every 4 (four) hours as needed for pain.         Marland Kitchen ondansetron (ZOFRAN) 4 MG tablet   Oral   Take 1 tablet (4 mg total) by mouth every 6 (six) hours.   12 tablet   0   . ondansetron (ZOFRAN-ODT) 8 MG disintegrating tablet   Oral   Take 1 tablet (8 mg total) by mouth every 8 (eight) hours as needed for nausea.   30 tablet   0    BP 143/99  Pulse 84  Temp(Src) 97.7 F (36.5 C) (Oral)  Resp 20  Ht 5\' 5"  (1.651 m)  Wt 175 lb (79.379 kg)  BMI 29.12 kg/m2  SpO2 100%  LMP 11/20/2012 Physical Exam  Constitutional: She is oriented to person, place, and time. She appears well-developed and well-nourished. No distress.  HENT:  Head: Normocephalic and atraumatic.  Mouth/Throat: Oropharynx is clear and moist. No oropharyngeal exudate.  Eyes: Conjunctivae and EOM are normal. Pupils are equal, round, and reactive to light.  Neck: Normal range of motion. Neck supple.  Cardiovascular: Normal rate, regular rhythm, normal heart sounds and intact distal pulses.   No murmur heard. Equal radial and DP pulses  Pulmonary/Chest: Effort normal and breath sounds normal. No respiratory distress.  Abdominal: Soft. There is tenderness. There is no rebound and no guarding.  Epigastric tenderness, no right upper quadrant tenderness, negative Murphy's sign  Musculoskeletal: Normal range of motion. She exhibits no edema and no tenderness.  No CVA tenderness  Neurological: She is alert and oriented to person, place, and time. No cranial nerve deficit. She exhibits normal muscle tone. Coordination normal.  Skin: Skin is warm.    ED Course  Procedures (including critical care time) Labs Review Labs Reviewed  COMPREHENSIVE METABOLIC PANEL - Abnormal; Notable for the following:     Total Bilirubin 0.2 (*)    All other components within normal limits  URINALYSIS, ROUTINE W REFLEX MICROSCOPIC - Abnormal; Notable for the following:    Specific Gravity, Urine <1.005 (*)    All other components within normal limits  CBC WITH DIFFERENTIAL  LIPASE, BLOOD  PREGNANCY, URINE  TROPONIN  I   Imaging Review No results found.  EKG Interpretation     Ventricular Rate:  78 PR Interval:  162 QRS Duration: 70 QT Interval:  384 QTC Calculation: 437 R Axis:   36 Text Interpretation:  Normal sinus rhythm Normal ECG When compared with ECG of 02-Nov-2012 04:25, No significant change was found            MDM   1. Epigastric pain    Ongoing epigastric pain with recent diagnosis of esophageal stricture that was dilated and gastritis. States compliance with PPI.  Abdomen soft without peritoneal signs. EKG is normal sinus rhythm. Patient given GI cocktail, PPI, IV fluids and antiemetic.  LFTs normal. Lipase normal. Pain improved after meds.  [place] not available.  CT ordered to evaluate gallbladder.  CT results pending at time of sign out to Dr. Adriana Simas.  Expect discharge with outpatient RUQ ultrasound tomorrow.   Glynn Octave, MD 01/12/13 [phone]

## 2013-01-12 NOTE — ED Notes (Signed)
Epigastric pain radiating into my back. Dr. Darrick Penna did an EGD on December 27, 2012. They did not find anything.  I have an ultrasound scheduled for this Wednesday per pt. Called the doctor before I came and he told me the only option was to come here. I am out of pain and the GI cocktail per pt. I take 80 MG of Protonix. They still need to rule out my Gallbladder and he said the protonix may not be working for me.

## 2013-01-12 NOTE — ED Provider Notes (Signed)
CT scan shows cholelithiasis. White count land liver functions were normal. Discharge home on pain and nausea medication. Ultrasound pending for tomorrow. General surgery referral. No acute abdomen  Donnetta Hutching, MD 01/12/13 [phone]

## 2013-01-12 NOTE — Telephone Encounter (Signed)
Patient called around 7 PM complaining of chest pain and heartburn; she has been on pantoprazole 40 mg twice a day. She ran out of GI cocktail yesterday and does not have pain medication. Patient advised to try OTC Nexium and Gaviscon and call [facility] in a.m. to schedule office visit with Dr. Darrick Penna. If pain is unbearable she can't emergency room.

## 2013-01-13 ENCOUNTER — Other Ambulatory Visit (HOSPITAL_COMMUNITY): Payer: Self-pay | Admitting: Emergency Medicine

## 2013-01-13 ENCOUNTER — Ambulatory Visit (HOSPITAL_COMMUNITY)
Admit: 2013-01-13 | Discharge: 2013-01-13 | Disposition: A | Discharge status: Home / Self Care | Payer: Managed Care, Other (non HMO) | Attending: Emergency Medicine | Admitting: Emergency Medicine

## 2013-01-13 ENCOUNTER — Telehealth: Payer: Self-pay | Admitting: Gastroenterology

## 2013-01-13 DIAGNOSIS — K8020 Calculus of gallbladder without cholecystitis without obstruction: Secondary | ICD-10-CM | POA: Insufficient documentation

## 2013-01-13 DIAGNOSIS — R109 Unspecified abdominal pain: Secondary | ICD-10-CM

## 2013-01-13 DIAGNOSIS — K7689 Other specified diseases of liver: Secondary | ICD-10-CM | POA: Insufficient documentation

## 2013-01-13 DIAGNOSIS — R1011 Right upper quadrant pain: Secondary | ICD-10-CM

## 2013-01-13 NOTE — Telephone Encounter (Signed)
Results Cc to PCP  

## 2013-01-13 NOTE — Telephone Encounter (Signed)
Pt called this afternoon to let us know that she went to the ED last night because the pain was so bad and we wouldn't write her a rx for pain last week. She said that she had CT done and showed that she has gall stones and she was having an U/S done today. She wanted SF to be aware of what has happened. [phone]

## 2013-01-13 NOTE — Telephone Encounter (Signed)
[place] of abdomen revealed gallstones, no acute signs of cholecystitis. Looks like she has been referred to General Surgery per the ED?

## 2013-01-13 NOTE — Telephone Encounter (Signed)
Routing to AS and LL/ Dr. Darrick Penna is not in.

## 2013-01-13 NOTE — ED Provider Notes (Signed)
15:30:  Patient return for outpatient ultrasound today. Shows cholelithiasis. Thickened gallbladder wall. However no dilated ducts. No pericholecystic fluid. She has had no pain today. She has name information for general surgeon. She'll call for an appointment.  Roney Marion, MD 01/13/13 [phone]

## 2013-01-14 ENCOUNTER — Other Ambulatory Visit (HOSPITAL_COMMUNITY): Payer: Managed Care, Other (non HMO)

## 2013-01-14 NOTE — Telephone Encounter (Signed)
This encounter was created in error - please disregard.

## 2013-01-14 NOTE — Telephone Encounter (Signed)
I have not seen the patient therefore I will allow input per AS and [facility].

## 2013-01-15 ENCOUNTER — Ambulatory Visit (HOSPITAL_COMMUNITY): Payer: Managed Care, Other (non HMO)

## 2013-01-15 NOTE — Telephone Encounter (Signed)
PLEASE CALL PT. Her u/s shows gallstones. SHE SHOULD SEE A SURGEON TO DISCUSS REMOVING HE GALLBLADDER. THE SURGEON CAN LET HER KNOW IF TAKING OUT HER GALLBLADDER WILL HELP WITH HER UPPER ABD PAIN.

## 2013-01-15 NOTE — Telephone Encounter (Signed)
REVIEWED.  

## 2013-01-16 ENCOUNTER — Other Ambulatory Visit: Payer: Self-pay | Admitting: Gastroenterology

## 2013-01-16 ENCOUNTER — Encounter (HOSPITAL_COMMUNITY)
Admission: RE | Admit: 2013-01-16 | Discharge: 2013-01-16 | Disposition: A | Discharge status: Home / Self Care | Payer: Managed Care, Other (non HMO) | Source: Ambulatory Visit | Attending: General Surgery | Admitting: General Surgery

## 2013-01-16 ENCOUNTER — Encounter (HOSPITAL_COMMUNITY): Payer: Self-pay | Admitting: Pharmacy Technician

## 2013-01-16 ENCOUNTER — Encounter (HOSPITAL_COMMUNITY): Payer: Self-pay

## 2013-01-16 LAB — AMYLASE: Amylase: 50 U/L (ref 0–105)

## 2013-01-16 NOTE — Telephone Encounter (Signed)
Results Cc to PCP  

## 2013-01-16 NOTE — Telephone Encounter (Signed)
Pt has appt with Dr. Malvin Johns today.

## 2013-01-16 NOTE — Patient Instructions (Signed)
[name]  01/16/2013   Your procedure is scheduled on:  01/17/13  Report to [facility] at 07:00 AM.  Call this number if you have problems the morning of surgery: [phone]   Remember:   Do not eat food or drink liquids after midnight.   Take these medicines the morning of surgery with A SIP OF WATER: Protonix. May take your Zofran, Percocet, and Dilaudid if needed.   Do not wear jewelry, make-up or nail polish.  Do not wear lotions, powders, or perfumes.   Do not shave 48 hours prior to surgery. Men may shave face and neck.  Do not bring valuables to the hospital.  [facility] is not responsible for any belongings or valuables.               Contacts, dentures or bridgework may not be worn into surgery.  Leave suitcase in the car. After surgery it may be brought to your room.  For patients admitted to the hospital, discharge time is determined by your treatment team.               Patients discharged the day of surgery will not be allowed to drive home.   Special Instructions: Shower using CHG 2 nights before surgery and the night before surgery.  If you shower the day of surgery use CHG.  Use special wash - you have one bottle of CHG for all showers.  You should use approximately 1/3 of the bottle for each shower.   Please read over the following fact sheets that you were given: Pain Booklet, Surgical Site Infection Prevention, Anesthesia Post-op Instructions and Care and Recovery After Surgery    Laparoscopic Cholecystectomy Laparoscopic cholecystectomy is surgery to remove the gallbladder. The gallbladder is located slightly to the right of center in the abdomen, behind the liver. It is a concentrating and storage sac for the bile produced in the liver. Bile aids in the digestion and absorption of fats. Gallbladder disease (cholecystitis) is an inflammation of your gallbladder. This condition is usually caused by a buildup of gallstones (cholelithiasis) in your gallbladder.  Gallstones can block the flow of bile, resulting in inflammation and pain. In severe cases, emergency surgery may be required. When emergency surgery is not required, you will have time to prepare for the procedure. Laparoscopic surgery is an alternative to open surgery. Laparoscopic surgery usually has a shorter recovery time. Your common bile duct may also need to be examined and explored. Your caregiver will discuss this with you if he or she feels this should be done. If stones are found in the common bile duct, they may be removed. LET YOUR CAREGIVER KNOW ABOUT:  Allergies to food or medicine.  Medicines taken, including vitamins, herbs, eyedrops, over-the-counter medicines, and creams.  Use of steroids (by mouth or creams).  Previous problems with anesthetics or numbing medicines.  History of bleeding problems or blood clots.  Previous surgery.  Other health problems, including diabetes and kidney problems.  Possibility of pregnancy, if this applies. RISKS AND COMPLICATIONS All surgery is associated with risks. Some problems that may occur following this procedure include:  Infection.  Damage to the common bile duct, nerves, arteries, veins, or other internal organs such as the stomach or intestines.  Bleeding.  A stone may remain in the common bile duct. BEFORE THE PROCEDURE  Do not take aspirin for 3 days prior to surgery or blood thinners for 1 week prior to surgery.  Do not eat or drink  anything after midnight the night before surgery.  Let your caregiver know if you develop a cold or other infectious problem prior to surgery.  You should be present 60 minutes before the procedure or as directed. PROCEDURE  You will be given medicine that makes you sleep (general anesthetic). When you are asleep, your surgeon will make several small cuts (incisions) in your abdomen. One of these incisions is used to insert a small, lighted scope (laparoscope) into the abdomen. The  laparoscope helps the surgeon see into your abdomen. Carbon dioxide gas will be pumped into your abdomen. The gas allows more room for the surgeon to perform your surgery. Other operating instruments are inserted through the other incisions. Laparoscopic procedures may not be appropriate when:  There is major scarring from previous surgery.  The gallbladder is extremely inflamed.  There are bleeding disorders or unexpected cirrhosis of the liver.  A pregnancy is near term.  Other conditions make the laparoscopic procedure impossible. If your surgeon feels it is not safe to continue with a laparoscopic procedure, he or she will perform an open abdominal procedure. In this case, the surgeon will make an incision to open the abdomen. This gives the surgeon a larger view and field to work within. This may allow the surgeon to perform procedures that sometimes cannot be performed with a laparoscope alone. Open surgery has a longer recovery time. AFTER THE PROCEDURE  You will be taken to the recovery area where a nurse will watch and check your progress.  You may be allowed to go home the same day.  Do not resume physical activities until directed by your caregiver.  You may resume a normal diet and activities as directed. Document Released: 02/13/2005 Document Revised: 05/08/2011 Document Reviewed: 09/25/2012 [facility] Patient Information 2014 [place], [place].    PATIENT INSTRUCTIONS POST-ANESTHESIA  IMMEDIATELY FOLLOWING SURGERY:  Do not drive or operate machinery for the first twenty four hours after surgery.  Do not make any important decisions for twenty four hours after surgery or while taking narcotic pain medications or sedatives.  If you develop intractable nausea and vomiting or a severe headache please notify your doctor immediately.  FOLLOW-UP:  Please make an appointment with your surgeon as instructed. You do not need to follow up with anesthesia unless specifically instructed  to do so.  WOUND CARE INSTRUCTIONS (if applicable):  Keep a dry clean dressing on the anesthesia/puncture wound site if there is drainage.  Once the wound has quit draining you may leave it open to air.  Generally you should leave the bandage intact for twenty four hours unless there is drainage.  If the epidural site drains for more than 36-48 hours please call the anesthesia department.  QUESTIONS?:  Please feel free to call your physician or the hospital operator if you have any questions, and they will be happy to assist you.

## 2013-01-17 ENCOUNTER — Encounter (HOSPITAL_COMMUNITY): Payer: Managed Care, Other (non HMO) | Admitting: Anesthesiology

## 2013-01-17 ENCOUNTER — Encounter (HOSPITAL_COMMUNITY)
Admission: RE | Disposition: A | Discharge status: Home / Self Care | Payer: Self-pay | Source: Ambulatory Visit | Attending: General Surgery

## 2013-01-17 ENCOUNTER — Encounter (HOSPITAL_COMMUNITY): Payer: Self-pay | Admitting: *Deleted

## 2013-01-17 ENCOUNTER — Ambulatory Visit (HOSPITAL_COMMUNITY)
Admission: RE | Admit: 2013-01-17 | Discharge: 2013-01-18 | Disposition: A | Discharge status: Home / Self Care | Payer: Managed Care, Other (non HMO) | Source: Ambulatory Visit | Attending: General Surgery | Admitting: General Surgery

## 2013-01-17 ENCOUNTER — Ambulatory Visit (HOSPITAL_COMMUNITY): Payer: Managed Care, Other (non HMO) | Admitting: Anesthesiology

## 2013-01-17 DIAGNOSIS — K8010 Calculus of gallbladder with chronic cholecystitis without obstruction: Secondary | ICD-10-CM

## 2013-01-17 DIAGNOSIS — M1990 Unspecified osteoarthritis, unspecified site: Secondary | ICD-10-CM

## 2013-01-17 DIAGNOSIS — Z01812 Encounter for preprocedural laboratory examination: Secondary | ICD-10-CM | POA: Insufficient documentation

## 2013-01-17 HISTORY — PX: CHOLECYSTECTOMY: SHX55

## 2013-01-17 SURGERY — LAPAROSCOPIC CHOLECYSTECTOMY
Anesthesia: General | Wound class: Contaminated

## 2013-01-17 MED ORDER — MIDAZOLAM HCL 2 MG/2ML IJ SOLN
INTRAMUSCULAR | Status: AC
  Filled 2013-01-17: qty 2

## 2013-01-17 MED ORDER — LIDOCAINE HCL (CARDIAC) 10 MG/ML IV SOLN
INTRAVENOUS | Status: DC | PRN
  Administered 2013-01-17: 20 mg via INTRAVENOUS

## 2013-01-17 MED ORDER — DEXAMETHASONE SODIUM PHOSPHATE 4 MG/ML IJ SOLN
INTRAMUSCULAR | Status: AC
  Filled 2013-01-17: qty 1

## 2013-01-17 MED ORDER — LORAZEPAM 2 MG/ML IJ SOLN
0.5000 mg | Freq: Every evening | INTRAMUSCULAR | Status: DC | PRN
  Administered 2013-01-17: 0.5 mg via INTRAVENOUS
  Filled 2013-01-17: qty 1

## 2013-01-17 MED ORDER — ONDANSETRON HCL 4 MG/2ML IJ SOLN
4.0000 mg | Freq: Four times a day (QID) | INTRAMUSCULAR | Status: DC | PRN
  Administered 2013-01-17: 4 mg via INTRAVENOUS
  Filled 2013-01-17: qty 2

## 2013-01-17 MED ORDER — POTASSIUM CHLORIDE IN NACL 20-0.9 MEQ/L-% IV SOLN
INTRAVENOUS | Status: DC
  Administered 2013-01-17 (×2): via INTRAVENOUS

## 2013-01-17 MED ORDER — GLYCOPYRROLATE 0.2 MG/ML IJ SOLN
INTRAMUSCULAR | Status: AC
  Filled 2013-01-17: qty 1

## 2013-01-17 MED ORDER — PROPOFOL 10 MG/ML IV BOLUS
INTRAVENOUS | Status: DC | PRN
  Administered 2013-01-17: 200 mg via INTRAVENOUS

## 2013-01-17 MED ORDER — GLYCOPYRROLATE 0.2 MG/ML IJ SOLN
INTRAMUSCULAR | Status: DC | PRN
  Administered 2013-01-17: 0.4 mg via INTRAVENOUS

## 2013-01-17 MED ORDER — BUPIVACAINE HCL (PF) 0.5 % IJ SOLN
INTRAMUSCULAR | Status: DC | PRN
  Administered 2013-01-17: 10 mL

## 2013-01-17 MED ORDER — ROCURONIUM BROMIDE 50 MG/5ML IV SOLN
INTRAVENOUS | Status: AC
  Filled 2013-01-17: qty 1

## 2013-01-17 MED ORDER — MIDAZOLAM HCL 2 MG/2ML IJ SOLN
2.0000 mg | Freq: Once | INTRAMUSCULAR | Status: AC
  Administered 2013-01-17: 2 mg via INTRAVENOUS

## 2013-01-17 MED ORDER — LACTATED RINGERS IV SOLN
INTRAVENOUS | Status: DC
  Administered 2013-01-17: 1000 mL via INTRAVENOUS
  Administered 2013-01-17: 11:00:00 via INTRAVENOUS

## 2013-01-17 MED ORDER — FENTANYL CITRATE 0.05 MG/ML IJ SOLN
INTRAMUSCULAR | Status: AC
  Filled 2013-01-17: qty 2

## 2013-01-17 MED ORDER — FENTANYL CITRATE 0.05 MG/ML IJ SOLN
INTRAMUSCULAR | Status: AC
  Filled 2013-01-17: qty 5

## 2013-01-17 MED ORDER — ROCURONIUM BROMIDE 100 MG/10ML IV SOLN
INTRAVENOUS | Status: DC | PRN
  Administered 2013-01-17: 40 mg via INTRAVENOUS

## 2013-01-17 MED ORDER — KETOROLAC TROMETHAMINE 30 MG/ML IJ SOLN
15.0000 mg | Freq: Four times a day (QID) | INTRAMUSCULAR | Status: DC | PRN
  Administered 2013-01-17 – 2013-01-18 (×3): 15 mg via INTRAVENOUS
  Filled 2013-01-17 (×3): qty 1

## 2013-01-17 MED ORDER — MIDAZOLAM HCL 2 MG/2ML IJ SOLN
INTRAMUSCULAR | Status: DC | PRN
  Administered 2013-01-17: 2 mg via INTRAVENOUS

## 2013-01-17 MED ORDER — PANTOPRAZOLE SODIUM 40 MG PO TBEC
40.0000 mg | DELAYED_RELEASE_TABLET | Freq: Two times a day (BID) | ORAL | Status: DC
  Administered 2013-01-17 – 2013-01-18 (×2): 40 mg via ORAL
  Filled 2013-01-17 (×3): qty 1

## 2013-01-17 MED ORDER — NEOSTIGMINE METHYLSULFATE 1 MG/ML IJ SOLN
INTRAMUSCULAR | Status: DC | PRN
  Administered 2013-01-17: 2 mg via INTRAVENOUS

## 2013-01-17 MED ORDER — ONDANSETRON HCL 4 MG PO TABS: 4.0000 mg | ORAL_TABLET | Freq: Four times a day (QID) | ORAL | Status: DC | PRN

## 2013-01-17 MED ORDER — DEXAMETHASONE SODIUM PHOSPHATE 4 MG/ML IJ SOLN
4.0000 mg | Freq: Once | INTRAMUSCULAR | Status: AC
  Administered 2013-01-17: 4 mg via INTRAVENOUS

## 2013-01-17 MED ORDER — FENTANYL CITRATE 0.05 MG/ML IJ SOLN
25.0000 ug | INTRAMUSCULAR | Status: DC | PRN
  Administered 2013-01-17 (×4): 50 ug via INTRAVENOUS

## 2013-01-17 MED ORDER — SODIUM CHLORIDE 0.9 % IR SOLN
Status: DC | PRN
  Administered 2013-01-17: 10 mL

## 2013-01-17 MED ORDER — ONDANSETRON HCL 4 MG/2ML IJ SOLN: 4.0000 mg | Freq: Once | INTRAMUSCULAR | Status: DC | PRN

## 2013-01-17 MED ORDER — NEOSTIGMINE METHYLSULFATE 1 MG/ML IJ SOLN
INTRAMUSCULAR | Status: AC
  Filled 2013-01-17: qty 1

## 2013-01-17 MED ORDER — CIPROFLOXACIN IN D5W 400 MG/200ML IV SOLN
400.0000 mg | Freq: Two times a day (BID) | INTRAVENOUS | Status: DC
  Administered 2013-01-17: 400 mg via INTRAVENOUS

## 2013-01-17 MED ORDER — SODIUM CHLORIDE 0.9 % IV SOLN
INTRAVENOUS | Status: DC | PRN
  Administered 2013-01-17 (×2): 1000 mL via INTRAMUSCULAR
  Administered 2013-01-17: 3000 mL via INTRAMUSCULAR

## 2013-01-17 MED ORDER — FENTANYL CITRATE 0.05 MG/ML IJ SOLN
INTRAMUSCULAR | Status: DC | PRN
  Administered 2013-01-17 (×2): 50 ug via INTRAVENOUS
  Administered 2013-01-17: 25 ug via INTRAVENOUS
  Administered 2013-01-17 (×3): 50 ug via INTRAVENOUS
  Administered 2013-01-17: 25 ug via INTRAVENOUS
  Administered 2013-01-17 (×3): 50 ug via INTRAVENOUS
  Administered 2013-01-17: 25 ug via INTRAVENOUS
  Administered 2013-01-17: 50 ug via INTRAVENOUS
  Administered 2013-01-17: 25 ug via INTRAVENOUS
  Administered 2013-01-17 (×4): 50 ug via INTRAVENOUS

## 2013-01-17 MED ORDER — MORPHINE SULFATE 2 MG/ML IJ SOLN
1.0000 mg | INTRAMUSCULAR | Status: DC | PRN
  Administered 2013-01-17 (×2): 1 mg via INTRAVENOUS
  Filled 2013-01-17 (×2): qty 1

## 2013-01-17 MED ORDER — MORPHINE SULFATE 2 MG/ML IJ SOLN
2.0000 mg | INTRAMUSCULAR | Status: DC | PRN
  Administered 2013-01-17 – 2013-01-18 (×4): 2 mg via INTRAVENOUS
  Filled 2013-01-17 (×4): qty 1

## 2013-01-17 MED ORDER — BUPIVACAINE HCL (PF) 0.5 % IJ SOLN
INTRAMUSCULAR | Status: AC
  Filled 2013-01-17: qty 30

## 2013-01-17 MED ORDER — ONDANSETRON HCL 4 MG/2ML IJ SOLN
INTRAMUSCULAR | Status: AC
  Filled 2013-01-17: qty 2

## 2013-01-17 MED ORDER — CIPROFLOXACIN IN D5W 400 MG/200ML IV SOLN
INTRAVENOUS | Status: AC
  Filled 2013-01-17: qty 200

## 2013-01-17 MED ORDER — MEGESTROL ACETATE 40 MG PO TABS
40.0000 mg | ORAL_TABLET | Freq: Every day | ORAL | Status: DC
  Administered 2013-01-18: 40 mg via ORAL
  Filled 2013-01-17 (×4): qty 1

## 2013-01-17 MED ORDER — PROPOFOL 10 MG/ML IV BOLUS
INTRAVENOUS | Status: AC
  Filled 2013-01-17: qty 20

## 2013-01-17 MED ORDER — FENTANYL CITRATE 0.05 MG/ML IJ SOLN
25.0000 ug | INTRAMUSCULAR | Status: AC
  Administered 2013-01-17 (×2): 25 ug via INTRAVENOUS

## 2013-01-17 MED ORDER — MORPHINE SULFATE 2 MG/ML IJ SOLN
1.0000 mg | Freq: Once | INTRAMUSCULAR | Status: AC
  Administered 2013-01-17: 1 mg via INTRAVENOUS
  Filled 2013-01-17: qty 1

## 2013-01-17 MED ORDER — WATER FOR IRRIGATION, STERILE IR SOLN
Status: DC | PRN
  Administered 2013-01-17: 2000 mL

## 2013-01-17 MED ORDER — HEMOSTATIC AGENTS (NO CHARGE) OPTIME
TOPICAL | Status: DC | PRN
  Administered 2013-01-17: 2 via TOPICAL

## 2013-01-17 MED ORDER — ONDANSETRON HCL 4 MG/2ML IJ SOLN
4.0000 mg | Freq: Once | INTRAMUSCULAR | Status: AC
  Administered 2013-01-17: 4 mg via INTRAVENOUS

## 2013-01-17 SURGICAL SUPPLY — 57 items
APPLICATOR COTTON TIP 6IN STRL (MISCELLANEOUS) ×2 IMPLANT
APPLIER CLIP LAPSCP 10X32 DD (CLIP) ×2 IMPLANT
BAG HAMPER (MISCELLANEOUS) ×2 IMPLANT
BLADE SURG 15 STRL LF DISP TIS (BLADE) ×1 IMPLANT
BLADE SURG 15 STRL SS (BLADE) ×1
CLOTH BEACON ORANGE TIMEOUT ST (SAFETY) ×2 IMPLANT
COVER LIGHT HANDLE STERIS (MISCELLANEOUS) ×4 IMPLANT
DECANTER SPIKE VIAL GLASS SM (MISCELLANEOUS) ×2 IMPLANT
DISSECTOR BLUNT TIP ENDO 5MM (MISCELLANEOUS) ×2 IMPLANT
DRSG TEGADERM 2-3/8X2-3/4 SM (GAUZE/BANDAGES/DRESSINGS) ×6 IMPLANT
ELECT BLADE 6 FLAT ULTRCLN (ELECTRODE) ×2 IMPLANT
ELECT REM PT RETURN 9FT ADLT (ELECTROSURGICAL) ×2
ELECTRODE REM PT RTRN 9FT ADLT (ELECTROSURGICAL) ×1 IMPLANT
EVACUATOR DRAINAGE 10X20 100CC (DRAIN) ×1 IMPLANT
EVACUATOR SILICONE 100CC (DRAIN) ×1
FILTER SMOKE EVAC LAPAROSHD (FILTER) ×2 IMPLANT
FORMALIN 10 PREFIL 120ML (MISCELLANEOUS) ×2 IMPLANT
GLOVE BIOGEL PI IND STRL 7.0 (GLOVE) ×2 IMPLANT
GLOVE BIOGEL PI IND STRL 7.5 (GLOVE) ×1 IMPLANT
GLOVE BIOGEL PI INDICATOR 7.0 (GLOVE) ×2
GLOVE BIOGEL PI INDICATOR 7.5 (GLOVE) ×1
GLOVE ECLIPSE 6.5 STRL STRAW (GLOVE) ×2 IMPLANT
GLOVE ECLIPSE 7.0 STRL STRAW (GLOVE) ×2 IMPLANT
GLOVE SKINSENSE NS SZ7.0 (GLOVE) ×1
GLOVE SKINSENSE STRL SZ7.0 (GLOVE) ×1 IMPLANT
GOWN STRL REIN XL XLG (GOWN DISPOSABLE) ×6 IMPLANT
HEMOSTAT SURGICEL 4X8 (HEMOSTASIS) ×4 IMPLANT
INST SET LAPROSCOPIC AP (KITS) ×2 IMPLANT
IV NS 1000ML (IV SOLUTION) ×2
IV NS 1000ML BAXH (IV SOLUTION) ×2 IMPLANT
IV NS IRRIG 3000ML ARTHROMATIC (IV SOLUTION) ×2 IMPLANT
KIT ROOM TURNOVER APOR (KITS) ×2 IMPLANT
MANIFOLD NEPTUNE II (INSTRUMENTS) ×2 IMPLANT
NEEDLE INSUFFLATION 14GA 120MM (NEEDLE) ×2 IMPLANT
NS IRRIG 1000ML POUR BTL (IV SOLUTION) ×2 IMPLANT
PACK LAP CHOLE LZT030E (CUSTOM PROCEDURE TRAY) ×2 IMPLANT
PAD ARMBOARD 7.5X6 YLW CONV (MISCELLANEOUS) ×2 IMPLANT
POUCH SPECIMEN RETRIEVAL 10MM (ENDOMECHANICALS) ×2 IMPLANT
SET BASIN LINEN APH (SET/KITS/TRAYS/PACK) ×2 IMPLANT
SET TUBE IRRIG SUCTION NO TIP (IRRIGATION / IRRIGATOR) ×2 IMPLANT
SLEEVE ENDOPATH XCEL 5M (ENDOMECHANICALS) ×2 IMPLANT
SOL PREP PROV IODINE SCRUB 4OZ (MISCELLANEOUS) ×2 IMPLANT
SPONGE DRAIN TRACH 4X4 STRL 2S (GAUZE/BANDAGES/DRESSINGS) ×2 IMPLANT
SPONGE GAUZE 2X2 8PLY STRL LF (GAUZE/BANDAGES/DRESSINGS) ×2 IMPLANT
SPONGE GAUZE 4X4 12PLY (GAUZE/BANDAGES/DRESSINGS) ×2 IMPLANT
STAPLER VISISTAT 35W (STAPLE) ×2 IMPLANT
SUT ETHILON 3 0 FSL (SUTURE) ×2 IMPLANT
SUT VICRYL 0 UR6 27IN ABS (SUTURE) ×2 IMPLANT
TAPE CLOTH SURG 4X10 WHT LF (GAUZE/BANDAGES/DRESSINGS) ×2 IMPLANT
TOWEL OR 17X26 4PK STRL BLUE (TOWEL DISPOSABLE) ×2 IMPLANT
TRAY FOLEY CATH 16FR SILVER (SET/KITS/TRAYS/PACK) ×2 IMPLANT
TROCAR ENDO BLADELESS 11MM (ENDOMECHANICALS) ×2 IMPLANT
TROCAR XCEL NON-BLD 5MMX100MML (ENDOMECHANICALS) ×2 IMPLANT
TROCAR XCEL UNIV SLVE 11M 100M (ENDOMECHANICALS) ×2 IMPLANT
TUBING INSUFFLATION HIGH FLOW (TUBING) ×2 IMPLANT
WARMER LAPAROSCOPE (MISCELLANEOUS) ×2 IMPLANT
WATER STERILE IRR 1000ML POUR (IV SOLUTION) ×4 IMPLANT

## 2013-01-17 NOTE — Progress Notes (Signed)
Patient complaining of pain of 7-8 after receiving pain medication at 1306.  Requesting Dilaudid states that it worked for her previously during ED visit.  Dr. Malvin Johns paged.  Dr. Malvin Johns returned page and gave order for patient to receive Toradol 15 mg IV every six hours as needed for pain.

## 2013-01-17 NOTE — Transfer of Care (Signed)
Immediate Anesthesia Transfer of Care Note  Patient: [name]  Procedure(s) Performed: Procedure(s) (LRB): LAPAROSCOPIC CHOLECYSTECTOMY (N/A)  Patient Location: PACU  Anesthesia Type: General  Level of Consciousness: awake  Airway & Oxygen Therapy: Patient Spontanous Breathing and non-rebreather face mask  Post-op Assessment: Report given to PACU RN, Post -op Vital signs reviewed and stable and Patient moving all extremities  Post vital signs: Reviewed and stable  Complications: No apparent anesthesia complications

## 2013-01-17 NOTE — Anesthesia Postprocedure Evaluation (Signed)
Anesthesia Post Note  Patient: [name]  Procedure(s) Performed: Procedure(s) (LRB): LAPAROSCOPIC CHOLECYSTECTOMY (N/A)  Anesthesia type: General  Patient location: PACU  Post pain: Pain level controlled  Post assessment: Post-op Vital signs reviewed, Patient's Cardiovascular Status Stable, Respiratory Function Stable, Patent Airway, No signs of Nausea or vomiting and Pain level controlled  Last Vitals:  Filed Vitals:   01/17/13 1127  BP: 131/91  Pulse: 90  Temp: 36.9 C  Resp: 19    Post vital signs: Reviewed and stable  Level of consciousness: awake and alert   Complications: No apparent anesthesia complications

## 2013-01-17 NOTE — Anesthesia Preprocedure Evaluation (Signed)
Anesthesia Evaluation  Patient identified by MRN, date of birth, ID band Patient awake    Reviewed: Allergy & Precautions, H&P , NPO status , Patient's Chart, lab work & pertinent test results  History of Anesthesia Complications (+) Family history of anesthesia reaction and history of anesthetic complications (family awareness hx)  Airway Mallampati: II TM Distance: >3 FB Neck ROM: Full    Dental  (+) Teeth Intact   Pulmonary Current Smoker,  breath sounds clear to auscultation        Cardiovascular negative cardio ROS  Rhythm:Regular Rate:Normal     Neuro/Psych  Headaches,    GI/Hepatic   Endo/Other    Renal/GU      Musculoskeletal   Abdominal   Peds  Hematology   Anesthesia Other Findings   Reproductive/Obstetrics                           Anesthesia Physical Anesthesia Plan  ASA: II  Anesthesia Plan: General   Post-op Pain Management:    Induction: Intravenous  Airway Management Planned: Oral ETT  Additional Equipment:   Intra-op Plan:   Post-operative Plan: Extubation in OR  Informed Consent: I have reviewed the patients History and Physical, chart, labs and discussed the procedure including the risks, benefits and alternatives for the proposed anesthesia with the patient or authorized representative who has indicated his/her understanding and acceptance.     Plan Discussed with:   Anesthesia Plan Comments:         Anesthesia Quick Evaluation

## 2013-01-17 NOTE — Progress Notes (Signed)
Filed Vitals:   01/17/13 1554  BP: 124/80  Pulse: 71  Temp: 97.9 F (36.6 C)  Resp: 20   Abdomen soft.  Having pain I think related to pneumoperitoneum.  Voiding well and has tolerated PO without  Nausea.Will try and adjust pain meds.

## 2013-01-17 NOTE — Progress Notes (Signed)
Patient complaining of surgical abdomen pain after pain medication administer. Dr. Malvin Johns in facility. Ordered one time dose of Morphine 1mg  now and increase Morphine to 2mg  every 3 hours PRN.

## 2013-01-17 NOTE — Anesthesia Procedure Notes (Signed)
Procedure Name: Intubation Date/Time: 01/17/2013 9:29 AM Performed by: Franco Nones Pre-anesthesia Checklist: Patient identified, Patient being monitored, Timeout performed, Emergency Drugs available and Suction available Patient Re-evaluated:Patient Re-evaluated prior to inductionOxygen Delivery Method: Circle System Utilized Preoxygenation: Pre-oxygenation with 100% oxygen Intubation Type: IV induction Ventilation: Mask ventilation without difficulty Laryngoscope Size: Miller and 2 Grade View: Grade I Tube type: Oral Tube size: 7.0 mm Number of attempts: 1 Airway Equipment and Method: stylet Placement Confirmation: ETT inserted through vocal cords under direct vision,  positive ETCO2 and breath sounds checked- equal and bilateral Secured at: 21 cm Tube secured with: Tape Dental Injury: Teeth and Oropharynx as per pre-operative assessment

## 2013-01-17 NOTE — Consult Note (Signed)
[name], CAVNESS NO.:  [id number]  MEDICAL RECORD NO.:  [id number]  LOCATION:  PERIO                         FACILITY:  [facility]  PHYSICIAN:  Barbaraann Barthel, M.D. DATE OF BIRTH:  03-13-1970  DATE OF CONSULTATION: DATE OF DISCHARGE:                                CONSULTATION   HISTORY OF PRESENT ILLNESS:  Note, this is a 42 year old white female who was referred for gallstones.  In essence, she has had right upper quadrant pain and epigastric pain which was postprandial in nature and which radiated to her back, and was accompanied with nausea and vomiting since July but in essence she has had this longer she thinks perhaps for a year with symptoms not as aggravated as over the last several weeks. She has been seen recently in the emergency room and she was self- referred to me as I had taken care of her sister in the past.  She had been previously treated for GERD by Dr. Lilyan Punt.  PAST MEDICAL HISTORY:  History of GERD and recurring migraine-type headaches.  PAST SURGICAL HISTORY:  She has had 2 C-sections.  MEDICATION LIST:  Please see medication list.  ALLERGIES:  She is allergic to PENICILLIN and SULFA.  SOCIAL HISTORY:  She also smokes at least a pack of cigarettes per day. She does not abuse alcohol.  PHYSICAL EXAMINATION:  GENERAL:  She is in no acute distress at present. VITAL SIGNS:  She is 5 feet 5 inches, weighs 175 pounds, temperature is 97.5 pulse is 60 and regular, respirations 12, blood pressure 130/64. HEENT:  Head is normocephalic.  Eyes, extraocular movements are intact. Pupils were equal, round, reactive to light and accommodation.  There are no bruits appreciated.  No adenopathy and no thyromegaly. CHEST:  Clear both anterior and posterior auscultation. HEART:  Regular rhythm. BREASTS:  Breasts and axillae are without masses. ABDOMEN  Soft.  She is softly tender in the epigastrium.  There is certainly no rebound, present.   Bowel sounds are normoactive, and she has no incisional or femoral or inguinal hernias. RECTAL:  Deferred by the patient's choice. EXTREMITIES:  Within normal limits without varicosities, edema, cyanosis or clubbing.  REVIEW OF SYSTEMS:  History of migraines or recurrent headaches.  No history of seizures, and there are no lateralizing neurological findings.  ENDOCRINE:  No history of diabetes or thyroid disease or adrenal problems.  CARDIOPULMONARY:  The patient does smoke a pack of cigarettes a day.  MUSCULOSKELETAL:  No history of any fractures or any obvious musculoskeletal problems.  OB/GYN:  She is a gravida 2, Cesarean 2, para 0, and abortus 0 (she has 2 sets of twins).  No family history of breast carcinoma.  Her last mammogram was a year and a half ago. Last menstrual period is scanty as she is on birth control pill regimen. GI:  History of GERD.  No past history of hepatitis, constipation, diarrhea, bright red rectal bleeding, black tarry stools, or history of inflammatory bowel disease or irritable bowel syndrome.  No history of unexplained weight loss.  The patient has never had a colonoscopy. GU:  History of frequency, dysuria, or kidney stones.  Review of  history and physical, therefore, Ms.  [name] is a 42 year old white female who has pretty much a classic history of biliary colic, most recently seen in the emergency room for this.  Her liver function studies are grossly within normal limits.  CT scan and sonogram revealed some slight thickening of the gallbladder with the obvious presence of stones.  No dilated biliary tree is noted.  She has also been checked for H. pylori, which has been negative.  She has her last white count on January 12, 2013, was 8.9 with an H and H of 14.3 and 41.7.  We discussed the need for surgery with her as she has had recurrent problems within recurring shorter intervals, I suggested not putting off this surgery as she is likely to have  complications from gallbladder disease which were discussed in detail with her.  The choice was hers, however, but she opted to have this taken care of before Thanksgiving. We will plan on doing this as soon as possible and she has been placed on a restrictive diet.  We discussed complications not limited to, but including bleeding, infection, damage to bile ducts, perforation of organs, transitory diarrhea, and the possibility that open surgery might be required. Informed consent was obtained.     Barbaraann Barthel, M.D.     WB/MEDQ  D:  01/16/2013  T:  01/17/2013  Job:  [id number]  cc:   Lorin Picket A. Gerda Diss, MD Fax: [phone]

## 2013-01-17 NOTE — Brief Op Note (Signed)
01/17/2013  11:21 AM  PATIENT:  [name]  42 y.o. female  PRE-OPERATIVE DIAGNOSIS:  cholecystitis; cholelithiasis  POST-OPERATIVE DIAGNOSIS:  * No post-op diagnosis entered *  PROCEDURE:  Procedure(s): LAPAROSCOPIC CHOLECYSTECTOMY (N/A)  SURGEON:  Surgeon(s) and Role:    * Marlane Hatcher, MD - Primary  PHYSICIAN ASSISTANT:   ASSISTANTS: none   ANESTHESIA:   general  EBL:  Total I/O In: 1000 [I.V.:1000] Out: 325 [Urine:300; Blood:25]  BLOOD ADMINISTERED:none  DRAINS: (in liver bed.) Jackson-Pratt drain(s) with closed bulb suction in the liver bed.   LOCAL MEDICATIONS USED:  MARCAINE  0.5% ~ 10 cc. SPECIMEN:  Source of Specimen:  gallbladder and stones.  DISPOSITION OF SPECIMEN:  PATHOLOGY  COUNTS:  YES  TOURNIQUET:  * No tourniquets in log *  DICTATION: .Other Dictation: Dictation Number OR dict. # [id number].  PLAN OF CARE: Admit for overnight observation  PATIENT DISPOSITION:  PACU - hemodynamically stable.   Delay start of Pharmacological VTE agent (>24hrs) due to surgical blood loss or risk of bleeding: not applicable

## 2013-01-17 NOTE — Progress Notes (Signed)
Admit 42 yr. Old W. Female for lap cholecystectomy for cholecystitis secondary to cholelithiasis.  Labs and studies reviewed and procedure and risks explained and consent obtained.  No clinical change in since H&P, dict. # [id number].  Filed Vitals:   01/17/13 0833  BP: 127/76  Pulse: 53  Temp: 95.6 F (35.3 C)  Resp: 16

## 2013-01-18 LAB — CBC
HCT: 35.3 % — ABNORMAL LOW (ref 36.0–46.0)
Hemoglobin: 11.6 g/dL — ABNORMAL LOW (ref 12.0–15.0)
MCH: 28.9 pg (ref 26.0–34.0)
MCHC: 32.9 g/dL (ref 30.0–36.0)
MCV: 87.8 fL (ref 78.0–100.0)
Platelets: 140 10*3/uL — ABNORMAL LOW (ref 150–400)
RBC: 4.02 MIL/uL (ref 3.87–5.11)
RDW: 13.7 % (ref 11.5–15.5)
WBC: 9.6 10*3/uL (ref 4.0–10.5)

## 2013-01-18 LAB — BASIC METABOLIC PANEL
BUN: 7 mg/dL (ref 6–23)
CO2: 25 mEq/L (ref 19–32)
Calcium: 8.8 mg/dL (ref 8.4–10.5)
Chloride: 107 mEq/L (ref 96–112)
Creatinine, Ser: 0.8 mg/dL (ref 0.50–1.10)
GFR calc Af Amer: >90 mL/min (ref >90)
GFR calc non Af Amer: 88 mL/min — ABNORMAL LOW (ref >90)
Glucose, Bld: 98 mg/dL (ref 70–99)
Potassium: 3.7 mEq/L (ref 3.5–5.1)
Sodium: 141 mEq/L (ref 135–145)

## 2013-01-18 LAB — HEPATIC FUNCTION PANEL
ALT: 30 U/L (ref 0–35)
AST: 24 U/L (ref 0–37)
Albumin: 3.1 g/dL — ABNORMAL LOW (ref 3.5–5.2)
Alkaline Phosphatase: 57 U/L (ref 39–117)
Bilirubin, Direct: <0.1 mg/dL (ref 0.0–0.3)
Total Bilirubin: 0.4 mg/dL (ref 0.3–1.2)
Total Protein: 5.6 g/dL — ABNORMAL LOW (ref 6.0–8.3)

## 2013-01-18 NOTE — Progress Notes (Signed)
POD # 1 Filed Vitals:   01/18/13 0610  BP: 116/63  Pulse: 74  Temp: 98.9 F (37.2 C)  Resp: 18   Pt doing very well.  Much less discomfort, minimal JP drainage.  Drain was removed.  Dressings changed and follow up arranged.  Abdomen is very soft and pt tolerating PO well and voiding without dysuria.  Pt. Has some residual PO narcotic pain meds should she need these, no new pain meds prescribed.  Discharge instructions given and follow up arranged.  Discharge dict.. Note # [id number].

## 2013-01-18 NOTE — Progress Notes (Signed)
Patient with orders to be discharge home. Discharge instructions given, verbalized understanding. Patient stable. Patient left with boyfriend in private vehicle, escorted by staff.

## 2013-01-18 NOTE — Op Note (Signed)
NAMEJONAY, [name] NO.:  [id number]  MEDICAL RECORD NO.:  [id number]  LOCATION:  A324                          FACILITY:  [facility]  PHYSICIAN:  Barbaraann Barthel, M.D. DATE OF BIRTH:  07/18/1970  DATE OF PROCEDURE:  01/17/2013 DATE OF DISCHARGE:                              OPERATIVE REPORT   DIAGNOSIS:  Cholecystitis, cholelithiasis.  PROCEDURE:  Laparoscopic cholecystectomy.  SPECIMEN:  Gallbladder and stones.  WOUND CLASSIFICATION:  Contaminated (minimal spillage of bile).  GROSS OPERATIVE FINDINGS:  The patient had a very floppy gallbladder with resolving inflammation from episodes of acute cholecystitis.  She also had 2 grape sized stones within the [facility] pouch.  The cystic duct appeared minimally dilated.  NOTE:  This is a 42 year old white female who had possibly a year worth of abdominal pain but worse since July classical gallbladder pain and right upper quadrant tenderness radiating to her back that was postprandial in nature and accompanied with multiple bouts of nausea and vomiting.  She was treated over this time by Lilyan Punt for GERD without improvement.  She was later seen in the emergency room and as I taking care of her family she self-referred to me for cholecystectomy.  We discussed surgery in detail, discussing complications, not limited to, but including bleeding, infection, damage to bile ducts, perforation of organs, transitory diarrhea, and the possibility that open surgery might be required.  Informed consent was obtained.  The patient was placed in the supine position.  After the adequate administration of general anesthesia via endotracheal intubation, her entire abdomen was prepped with Betadine solution and draped in usual manner.  Prior to this, a Foley catheter was aseptically inserted.  A periumbilical incision was carried out over the superior aspect of the umbilicus down to the fascia which was grasped with a sharp  towel clip and elevated.  A Veress needle was inserted and confirmed in position with a saline drop test.  We then insufflated the abdomen with approximately 3.6 L of CO2 and under direct vision placed another 11-mm cannula in the epigastrium and two 5-mm cannulas in the right upper quadrant laterally.  The gallbladder was grasped, it was very floppy, we had to reposition, there was resolving adhesions which we were able to take down with good visualization of the cystic duct, which we triply silver clipped and divided.  We also triply silver clipped and divided the cystic artery.  There was a small branch that went to lymph node that caused a little bleeding which we were able to easily control with a coping device.  We then removed the gallbladder from the liver bed, this was fairly uneventful as the patient had somewhat of a mesentery of its gallbladder and this was done without a problem.  However, the gallbladder was very floppy towards the fundus and there was a little bile spillage at the end of the procedure when removing the gallbladder. We had irrigated carefully with normal saline solution and desufflated the abdomen while we were ready to close the incisions.  The Al Pimple drain which we had placed in the liver bed along with Surgicel was inadvertently removed, so we then re-insufflated the abdomen  and replaced the Jackson-Pratt drain below the liver and checked again for hemostasis and irrigated, there were no problems.  We then sutured the drain in place with 3-0 nylon and then closed the fascial incisions in the area of the epigastrium and the umbilicus with 0 Polysorb and closed the skin with a stapling device.  Prior to closure, all sponge, needle, and instrument counts were found to be correct.  The estimated blood loss was less than 50 mL.  The patient received 1250 mL of crystalloids intraoperatively.  There were no complications.     Barbaraann Barthel,  M.D.     WB/MEDQ  D:  01/17/2013  T:  01/18/2013  Job:  [id number]  cc:   Lorin Picket A. Gerda Diss, MD Fax: [phone]

## 2013-01-18 NOTE — Anesthesia Postprocedure Evaluation (Signed)
  Anesthesia Post-op Note  Patient: [name]  Procedure(s) Performed: Procedure(s): LAPAROSCOPIC CHOLECYSTECTOMY (N/A)  Patient Location: room 324  Anesthesia Type:General  Level of Consciousness: awake, alert , oriented and patient cooperative  Airway and Oxygen Therapy: Patient Spontanous Breathing  Post-op Pain: 3 /10, mild  Post-op Assessment: Post-op Vital signs reviewed, Patient's Cardiovascular Status Stable, Respiratory Function Stable, Patent Airway and Pain level controlled  Post-op Vital Signs: Reviewed and stable  Complications: No apparent anesthesia complications

## 2013-01-19 NOTE — Discharge Summary (Signed)
[name], [name] NO.:  [id number]  MEDICAL RECORD NO.:  [id number]  LOCATION:  A324                          FACILITY:  [facility]  PHYSICIAN:  Barbaraann Barthel, M.D. DATE OF BIRTH:  Sep 26, 1970  DATE OF ADMISSION:  01/17/2013 DATE OF DISCHARGE:  11/22/2014LH                              DISCHARGE SUMMARY   DIAGNOSES:  Cholecystitis cholelithiasis.  PROCEDURE:  On January 16, 2013, laparoscopic cholecystectomy.  NOTE:  This is a 42 year old white female, who had recurrent episodes of right upper quadrant pain for almost a year.  She was treated for GERD by the medical service and these symptoms increased over the last 3 months and she was seen in the emergency room and self-referred after she was found to have cholecystitis secondary to cholelithiasis.  She was taken to surgery via the outpatient department, at which time, a laparoscopic cholecystectomy was performed uneventfully.  The patient postoperatively did very well and was discharged on the first postoperative day.  Her postoperative liver function studies showed no unusual bump.  Her bilirubin remained normal and she had very little drainage from her Jackson-Pratt drain, which was serous sanguinous in nature and this was removed just prior to discharge.  Her laboratory data was also acceptable.  The patient was doing very well after surgery and she was tolerating p.o. without nausea she had known leg pain, shortness of breath or chest pain, or dysuria.  We have made followup arrangements with her.  She had been given some narcotic a p.o. pain meds in the emergency room and she still had 10 or 15 of these left, so I did not order her any new narcotics and she is told to take Tylenol and avoid taking the NSAID that she takes sort of eventually for her chronic arthritic complaints.  At any rate, we will follow her postoperatively for any postoperative problems she has after which she is to return to  Dr. Lilyan Punt for any medical problems she may have in the future.     Barbaraann Barthel, M.D.     WB/MEDQ  D:  01/18/2013  T:  01/19/2013  Job:  [id number]  cc:   Lorin Picket A. Gerda Diss, MD Fax: [phone]

## 2013-01-20 NOTE — Progress Notes (Signed)
UR chart review completed.  

## 2013-01-22 ENCOUNTER — Encounter (HOSPITAL_COMMUNITY): Payer: Self-pay | Admitting: General Surgery

## 2013-01-27 HISTORY — PX: ENDOMETRIAL ABLATION: SHX621

## 2013-01-29 ENCOUNTER — Telehealth: Payer: Self-pay | Admitting: Adult Health

## 2013-01-30 NOTE — Telephone Encounter (Signed)
Pt is having bleeding again, had GB out recently and wants ablation, go back on megace and come in 12/19 to see J[facility] for pre op for ablation

## 2013-02-17 ENCOUNTER — Encounter: Payer: Self-pay | Admitting: Obstetrics and Gynecology

## 2013-02-17 ENCOUNTER — Ambulatory Visit (INDEPENDENT_AMBULATORY_CARE_PROVIDER_SITE_OTHER): Payer: Managed Care, Other (non HMO) | Admitting: Obstetrics and Gynecology

## 2013-02-17 ENCOUNTER — Other Ambulatory Visit (HOSPITAL_COMMUNITY)
Admission: RE | Admit: 2013-02-17 | Discharge: 2013-02-17 | Disposition: A | Discharge status: Home / Self Care | Payer: Managed Care, Other (non HMO) | Source: Ambulatory Visit | Attending: Obstetrics and Gynecology | Admitting: Obstetrics and Gynecology

## 2013-02-17 VITALS — BP 98/60 | Ht 66.0 in | Wt 178.4 lb

## 2013-02-17 DIAGNOSIS — Z01419 Encounter for gynecological examination (general) (routine) without abnormal findings: Secondary | ICD-10-CM

## 2013-02-17 DIAGNOSIS — Z1151 Encounter for screening for human papillomavirus (HPV): Secondary | ICD-10-CM | POA: Insufficient documentation

## 2013-02-17 DIAGNOSIS — N938 Other specified abnormal uterine and vaginal bleeding: Secondary | ICD-10-CM | POA: Insufficient documentation

## 2013-02-17 DIAGNOSIS — Z113 Encounter for screening for infections with a predominantly sexual mode of transmission: Secondary | ICD-10-CM | POA: Insufficient documentation

## 2013-02-17 DIAGNOSIS — N949 Unspecified condition associated with female genital organs and menstrual cycle: Secondary | ICD-10-CM

## 2013-02-17 DIAGNOSIS — N925 Other specified irregular menstruation: Secondary | ICD-10-CM

## 2013-02-17 NOTE — Patient Instructions (Addendum)
RETURN 1 DAY FOR ENDOMETRIAL BIOPSY Place abnormal uterine bleeding patient instructions here.

## 2013-02-17 NOTE — Progress Notes (Signed)
  Subjective:     [name] is a 42 y.o. woman who presents for irregular menses. No LMP recorded. Menarche age: 39. Periods are rare, lasting several days. Dysmenorrhea:none. Cyclic symptoms include: PATIENT is spotting daily, had u/s in july here, will retrieve results, hhas been on Megace x 2+ months with breakthru bleeding. Current contraception: tubal ligation.History of infertility: no. History of abnormal Pap smear: yes - done todqy.  The following portions of the patient's history were reviewed and updated as appropriate: allergies, past medical history and problem list.  Review of Systems Pertinent items are noted in HPI.     Objective:    BP 98/60  Ht 5\' 6"  (1.676 m)  Wt 178 lb 6.4 oz (80.922 kg)  BMI 28.81 kg/m2  General Appearance:    Alert, cooperative, no distress, appears stated age  Head:    Normocephalic, without obvious abnormality, atraumatic  Eyes:    PERRL, conjunctiva/corneas clear, EOM's intact, fundi    benign, both eyes  Ears:    Normal TM's and external ear canals, both ears  Nose:   Nares normal, septum midline, mucosa normal, no drainage    or sinus tenderness  Throat:   Lips, mucosa, and tongue normal; teeth and gums normal  Neck:   Supple, symmetrical, trachea midline, no adenopathy;    thyroid:  no enlargement/tenderness/nodules; no carotid   bruit or JVD  Back:     Symmetric, no curvature, ROM normal, no CVA tenderness  Lungs:     Clear to auscultation bilaterally, respirations unlabored  Chest Wall:    No tenderness or deformity   Heart:    Regular rate and rhythm, S1 and S2 normal, no murmur, rub   or gallop  Breast Exam:    No tenderness, masses, or nipple abnormality  Abdomen:     Soft, non-tender, bowel sounds active all four quadrants,    no masses, no organomegaly  Genitalia:    Normal female without lesion, discharge or tenderness  Rectal:    Normal tone, normal prostate, no masses or tenderness;   guaiac negative stool  Extremities:    Extremities normal, atraumatic, no cyanosis or edema  Pulses:   2+ and symmetric all extremities  Skin:   Skin color, texture, turgor normal, no rashes or lesions  Lymph nodes:   Cervical, supraclavicular, and axillary nodes normal  Neurologic:   CNII-XII intact, normal strength, sensation and reflexes    throughout      Assessment:    The patient has DUB, unacceptabel to pt, failing megace supression. Pt requiests endometrial ablation..    Plan:    Await Pap results. Biopsy endometrium scheduled for tomorrow. Will retrieve u/s,   Also, will discuss whether she wants to consider salpingectomy at time of surgery

## 2013-02-18 ENCOUNTER — Other Ambulatory Visit: Payer: Self-pay | Admitting: Obstetrics and Gynecology

## 2013-02-18 ENCOUNTER — Encounter: Payer: Self-pay | Admitting: Obstetrics and Gynecology

## 2013-02-18 ENCOUNTER — Ambulatory Visit (INDEPENDENT_AMBULATORY_CARE_PROVIDER_SITE_OTHER): Payer: Managed Care, Other (non HMO) | Admitting: Obstetrics and Gynecology

## 2013-02-18 VITALS — BP 94/58 | Ht 66.0 in | Wt 178.0 lb

## 2013-02-18 DIAGNOSIS — N925 Other specified irregular menstruation: Secondary | ICD-10-CM

## 2013-02-18 DIAGNOSIS — N949 Unspecified condition associated with female genital organs and menstrual cycle: Secondary | ICD-10-CM

## 2013-02-18 DIAGNOSIS — Z3202 Encounter for pregnancy test, result negative: Secondary | ICD-10-CM

## 2013-02-18 DIAGNOSIS — N938 Other specified abnormal uterine and vaginal bleeding: Secondary | ICD-10-CM

## 2013-02-18 DIAGNOSIS — Z01818 Encounter for other preprocedural examination: Secondary | ICD-10-CM

## 2013-02-18 LAB — POCT URINE PREGNANCY: Preg Test, Ur: NEGATIVE

## 2013-02-18 NOTE — Patient Instructions (Signed)

## 2013-02-18 NOTE — Progress Notes (Addendum)
Patient ID: [name], female   DOB: 1971-01-22, 42 y.o.   MRN: [id number]  Endometrial Biopsy: Patient given informed consent, signed copy in the chart, time out was performed. Time out taken. . The patient was placed in the lithotomy position and the cervix brought into view with sterile speculum.  Portio of cervix cleansed x 2 with betadine swabs.  A tenaculum was placed in the anterior lip of the cervix. The uterus was sounded for depth of 9.5 cm,. Milex uterine Explora 3 mm was introduced to into the uterus, suction created,  and an endometrial sample was obtained. All equipment was removed and accounted for.   The patient tolerated the procedure well.    Patient given post procedure instructions.  Followup: 2 wk

## 2013-02-23 NOTE — H&P (Signed)
  Subjective:   [name] is a 42 y.o. woman who presents for irregular menses. No LMP recorded. Menarche age: 63. Periods are rare, lasting several days. Dysmenorrhea:none. Cyclic symptoms include: PATIENT is spotting daily, had u/s in july here, will retrieve results, hhas been on Megace x 2+ months with breakthru bleeding. Current contraception: tubal ligation.History of infertility: no. History of abnormal Pap smear: yes - done Now normal.  The following portions of the patient's history were reviewed and updated as appropriate: allergies, past medical history and problem list.  Review of Systems  Pertinent items are noted in HPI.   Objective:   BP 98/60  Ht 5\' 6"  (1.676 m)  Wt 178 lb 6.4 oz (80.922 kg)  BMI 28.81 kg/m2    General Appearance:  Alert, cooperative, no distress, appears stated age    Head:  Normocephalic, without obvious abnormality, atraumatic    Eyes:  PERRL, conjunctiva/corneas clear, EOM's intact, fundi  benign, both eyes    Ears:  Normal TM's and external ear canals, both ears    Nose:  Nares normal, septum midline, mucosa normal, no drainage or sinus tenderness    Throat:  Lips, mucosa, and tongue normal; teeth and gums normal    Neck:  Supple, symmetrical, trachea midline, no adenopathy;  thyroid: no enlargement/tenderness/nodules; no carotid  bruit or JVD    Back:  Symmetric, no curvature, ROM normal, no CVA tenderness    Lungs:  Clear to auscultation bilaterally, respirations unlabored    Chest Wall:  No tenderness or deformity    Heart:  Regular rate and rhythm, S1 and S2 normal, no murmur, rub or gallop    Breast Exam:  No tenderness, masses, or nipple abnormality    Abdomen:  Soft, non-tender, bowel sounds active all four quadrants,  no masses, no organomegaly    Genitalia:  Normal female without lesion, discharge or tenderness    Rectal:  Normal tone, normal prostate, no masses or tenderness;  guaiac negative stool    Extremities:  Extremities normal,  atraumatic, no cyanosis or edema    Pulses:  2+ and symmetric all extremities    Skin:  Skin color, texture, turgor normal, no rashes or lesions    Lymph nodes:  Cervical, supraclavicular, and axillary nodes normal    Neurologic:  CNII-XII intact, normal strength, sensation and reflexes  throughout    Assessment:     The patient has DUB, unacceptabel to pt, failing megace supression. Pt requiests endometrial ablation..     Plan:     Await Pap results. : normal Biopsy endometrium:;Done  normal.  Will retrieve u/s,   discussed whether she wants to consider salpingectomy at time of surgery, patient is considered lo risk and not a covered service at this time, so will not include salpingectomy. Plan: hysteroscopy, dilation and curettage, ENDOMETRIAL ABLATION 02/25/2013

## 2013-02-24 ENCOUNTER — Encounter (HOSPITAL_COMMUNITY)
Admission: RE | Admit: 2013-02-24 | Discharge: 2013-02-24 | Disposition: A | Discharge status: Home / Self Care | Payer: Managed Care, Other (non HMO) | Source: Ambulatory Visit | Attending: Obstetrics and Gynecology | Admitting: Obstetrics and Gynecology

## 2013-02-24 ENCOUNTER — Encounter (HOSPITAL_COMMUNITY): Payer: Self-pay

## 2013-02-24 HISTORY — DX: Gastro-esophageal reflux disease without esophagitis: K21.9

## 2013-02-24 LAB — URINE MICROSCOPIC-ADD ON

## 2013-02-24 LAB — CBC
HCT: 40 % (ref 36.0–46.0)
Hemoglobin: 13.4 g/dL (ref 12.0–15.0)
MCH: 29.1 pg (ref 26.0–34.0)
MCHC: 33.5 g/dL (ref 30.0–36.0)
MCV: 87 fL (ref 78.0–100.0)
Platelets: 138 10*3/uL — ABNORMAL LOW (ref 150–400)
RBC: 4.6 MIL/uL (ref 3.87–5.11)
RDW: 13.1 % (ref 11.5–15.5)
WBC: 7.7 10*3/uL (ref 4.0–10.5)

## 2013-02-24 LAB — URINALYSIS, ROUTINE W REFLEX MICROSCOPIC
Bilirubin Urine: NEGATIVE
Glucose, UA: NEGATIVE mg/dL
Ketones, ur: NEGATIVE mg/dL
Leukocytes, UA: NEGATIVE
Nitrite: NEGATIVE
Protein, ur: NEGATIVE mg/dL
Specific Gravity, Urine: 1.02 (ref 1.005–1.030)
Urobilinogen, UA: 0.2 mg/dL (ref 0.0–1.0)
pH: 6.5 (ref 5.0–8.0)

## 2013-02-24 LAB — HCG, SERUM, QUALITATIVE: Preg, Serum: NEGATIVE

## 2013-02-24 NOTE — Patient Instructions (Signed)
[name]  02/24/2013   Your procedure is scheduled on:   02/25/2013  Report to [facility] at  715  AM.  Call this number if you have problems the morning of surgery: [phone]   Remember:   Do not eat food or drink liquids after midnight.   Take these medicines the morning of surgery with A SIP OF WATER: protonix   Do not wear jewelry, make-up or nail polish.  Do not wear lotions, powders, or perfumes.   Do not shave 48 hours prior to surgery. Men may shave face and neck.  Do not bring valuables to the hospital.  [facility] is not responsible for any belongings or valuables.               Contacts, dentures or bridgework may not be worn into surgery.  Leave suitcase in the car. After surgery it may be brought to your room.  For patients admitted to the hospital, discharge time is determined by your treatment team.               Patients discharged the day of surgery will not be allowed to drive home.  Name and phone number of your driver: family  Special Instructions: N/A   Please read over the following fact sheets that you were given: Pain Booklet, Coughing and Deep Breathing, Anesthesia Post-op Instructions and Care and Recovery After Surgery Endometrial Ablation Endometrial ablation removes the lining of the uterus (endometrium). It is usually a same-day, outpatient treatment. Ablation helps avoid major surgery, such as surgery to remove the cervix and uterus (hysterectomy). After endometrial ablation, you will have little or no menstrual bleeding and may not be able to have children. However, if you are premenopausal, you will need to use a reliable method of birth control following the procedure because of the small chance that pregnancy can occur. There are different reasons to have this procedure, which include:  Heavy periods.  Bleeding that is causing anemia.  Irregular bleeding.  Bleeding fibroids on the lining inside the uterus if they are  smaller than 3 centimeters. This procedure should not be done if:  You want children in the future.  You have severe cramps with your menstrual period.  You have precancerous or cancerous cells in your uterus.  You were recently pregnant.  You have gone through menopause.  You have had major surgery on the uterus, such as a cesarean delivery. LET [facility] CARE PROVIDER KNOW ABOUT:  Any allergies you have.  All medicines you are taking, including vitamins, herbs, eye drops, creams, and over-the-counter medicines.  Previous problems you or members of your family have had with the use of anesthetics.  Any blood disorders you have.  Previous surgeries you have had.  Medical conditions you have. RISKS AND COMPLICATIONS  Generally, this is a safe procedure. However, as with any procedure, complications can occur. Possible complications include:  Perforation of the uterus.  Bleeding.  Infection of the uterus, bladder, or vagina.  Injury to surrounding organs.  An air bubble to the lung (air embolus).  Pregnancy following the procedure.  Failure of the procedure to help the problem, requiring hysterectomy.  Decreased ability to diagnose cancer in the lining of the uterus. BEFORE THE PROCEDURE  The lining of the uterus must be tested to make sure there is no pre-cancerous or cancer cells present.  An ultrasound may be performed to look at the size of  the uterus and to check for abnormalities.  Medicines may be given to thin the lining of the uterus. PROCEDURE  During the procedure, your health care provider will use a tool called a resectoscope to help see inside your uterus. There are different ways to remove the lining of your uterus.   Radiofrequency  This method uses a radiofrequency-alternating electric current to remove the lining of the uterus.  Cryotherapy This method uses extreme cold to freeze the lining of the uterus.  Heated-Free Liquid  This method  uses heated salt (saline) solution to remove the lining of the uterus.  Microwave This method uses high-energy microwaves to heat up the lining of the uterus to remove it.  Thermal balloon  This method involves inserting a catheter with a balloon tip into the uterus. The balloon tip is filled with heated fluid to remove the lining of the uterus. AFTER THE PROCEDURE  After your procedure, do not have sexual intercourse or insert anything into your vagina until permitted by your health care provider. After the procedure, you may experience:  Cramps.  Vaginal discharge.  Frequent urination. Document Released: 12/24/2003 Document Revised: 10/16/2012 Document Reviewed: 07/17/2012 [facility] Patient Information 2014 [place], [place]. Hysteroscopy Hysteroscopy is a procedure used for looking inside the womb (uterus). It may be done for many different reasons, including:  To evaluate abnormal bleeding, fibroid (benign, noncancerous) tumors, polyps, scar tissue (adhesions), and possibly cancer of the uterus.  To look for lumps (tumors) and other uterine growths.  To look for causes of why a woman cannot get pregnant (infertility), causes of recurrent loss of pregnancy (miscarriages), or a lost intrauterine device (IUD).  To perform a sterilization by blocking the fallopian tubes from inside the uterus. A hysteroscopy should be done right after a menstrual period to be sure you are not pregnant. LET YOUR CAREGIVER KNOW ABOUT:   Allergies.  Medicines taken, including herbs, eyedrops, over-the-counter medicines, and creams.  Use of steroids (by mouth or creams).  Previous problems with anesthetics or numbing medicines.  History of bleeding or blood problems.  History of blood clots.  Possibility of pregnancy, if this applies.  Previous surgery.  Other health problems. RISKS AND COMPLICATIONS   Putting a hole in the uterus.  Excessive bleeding.  Infection.  Damage to the  cervix.  Injury to other organs.  Allergic reaction to medicines.  Too much fluid used in the uterus for the procedure. BEFORE THE PROCEDURE   Do not take aspirin or blood thinners for a week before the procedure, or as directed. It can cause bleeding.  Arrive at least 60 minutes before the procedure or as directed to read and sign the necessary forms.  Arrange for someone to take you home after the procedure.  If you smoke, do not smoke for 2 weeks before the procedure. PROCEDURE   Your caregiver may give you medicine to relax you. He or she may also give you a medicine that numbs the area around the cervix (local anesthetic) or a medicine that makes you sleep (general anesthesia).  Sometimes, a medicine is placed in the cervix the day before the procedure. This medicine makes the cervix have a larger opening (dilate). This makes it easier for the instrument to be inserted into the uterus.  A small instrument (hysteroscope) is inserted through the vagina into the uterus. This instrument is similar to a pencil-sized telescope with a light.  During the procedure, air or a liquid is put into the uterus, which allows  the surgeon to see better.  Sometimes, tissue is gently scraped from inside the uterus. These tissue samples are sent to a specialist who looks at tissue samples (pathologist). The pathologist will give a report to your caregiver. This will help your caregiver decide if further treatment is necessary. The report will also help your caregiver decide on the best treatment if the test comes back abnormal. AFTER THE PROCEDURE   If you had a general anesthetic, you may be groggy for a couple hours after the procedure.  If you had a local anesthetic, you will be advised to rest at the surgical center or caregiver's office until you are stable and feel ready to go home.  You may have some cramping for a couple days.  You may have bleeding, which varies from light spotting for a  few days to menstrual-like bleeding for up to 3 to 7 days. This is normal.  Have someone take you home. FINDING OUT THE RESULTS OF YOUR TEST Not all test results are available during your visit. If your test results are not back during the visit, make an appointment with your caregiver to find out the results. Do not assume everything is normal if you have not heard from your caregiver or the medical facility. It is important for you to follow up on all of your test results. HOME CARE INSTRUCTIONS   Do not drive for 24 hours or as instructed.  Only take over-the-counter or prescription medicines for pain, discomfort, or fever as directed by your caregiver.  Do not take aspirin. It can cause or aggravate bleeding.  Do not drive or drink alcohol while taking pain medicine.  You may resume your usual diet.  Do not use tampons, douche, or have sexual intercourse for 2 weeks, or as advised by your caregiver.  Rest and sleep for the first 24 to 48 hours.  Take your temperature twice a day for 4 to 5 days. Write it down. Give these temperatures to your caregiver if they are abnormal (above 98.6 F or 37.0 C).  Take medicines your caregiver has ordered as directed.  Follow your caregiver's advice regarding diet, exercise, lifting, driving, and general activities.  Take showers instead of baths for 2 weeks, or as recommended by your caregiver.  If you develop constipation:  Take a mild laxative with the advice of your caregiver.  Eat bran foods.  Drink enough water and fluids to keep your urine clear or pale yellow.  Try to have someone with you or available to you for the first 24 to 48 hours, especially if you had a general anesthetic.  Make sure you and your family understand everything about your operation and recovery.  Follow your caregiver's advice regarding follow-up appointments and Pap smears. SEEK MEDICAL CARE IF:   You feel dizzy or lightheaded.  You feel sick to  your stomach (nauseous).  You develop abnormal vaginal discharge.  You develop a rash.  You have an abnormal reaction or allergy to your medicine.  You need stronger pain medicine. SEEK IMMEDIATE MEDICAL CARE IF:   Bleeding is heavier than a normal menstrual period or you have blood clots.  You have an oral temperature above 102 F (38.9 C), not controlled by medicine.  You have increasing cramps or pains not relieved with medicine.  You develop belly (abdominal) pain that does not seem to be related to the same area of earlier cramping and pain.  You pass out.  You develop pain in the  tops of your shoulders (shoulder strap areas).  You develop shortness of breath. MAKE SURE YOU:   Understand these instructions.  Will watch your condition.  Will get help right away if you are not doing well or get worse. Document Released: 05/22/2000 Document Revised: 05/08/2011 Document Reviewed: 09/12/2012 [facility] Patient Information 2014 [place], [place]. Dilation and Curettage or Vacuum Curettage Dilation and curettage (D&C) and vacuum curettage are minor procedures. A D&C involves stretching (dilation) the cervix and scraping (curettage) the inside lining of the womb (uterus). During a D&C, tissue is gently scraped from the inside lining of the uterus. During a vacuum curettage, the lining and tissue in the uterus are removed with the use of gentle suction.  Curettage may be performed to either diagnose or treat a problem. As a diagnostic procedure, curettage is performed to examine tissues from the uterus. A diagnostic curettage may be performed for the following symptoms:   Irregular bleeding in the uterus.   Bleeding with the development of clots.   Spotting between menstrual periods.   Prolonged menstrual periods.   Bleeding after menopause.   No menstrual period (amenorrhea).   A change in size and shape of the uterus.  As a treatment procedure, curettage may be  performed for the following reasons:   Removal of an IUD (intrauterine device).   Removal of retained placenta after giving birth. Retained placenta can cause an infection or bleeding severe enough to require transfusions.   Abortion.   Miscarriage.   Removal of polyps inside the uterus.   Removal of uncommon types of noncancerous lumps (fibroids).  LET [facility] CARE PROVIDER KNOW ABOUT:   Any allergies you have.   All medicines you are taking, including vitamins, herbs, eye drops, creams, and over-the-counter medicines.   Previous problems you or members of your family have had with the use of anesthetics.   Any blood disorders you have.   Previous surgeries you have had.   Medical conditions you have. RISKS AND COMPLICATIONS  Generally, this is a safe procedure. However, as with any procedure, complications can occur. Possible complications include:  Excessive bleeding.   Infection of the uterus.   Damage to the cervix.   Development of scar tissue (adhesions) inside the uterus, later causing abnormal amounts of menstrual bleeding.   Complications from the general anesthetic, if a general anesthetic is used.   Putting a hole (perforation) in the uterus. This is rare.  BEFORE THE PROCEDURE   Eat and drink before the procedure only as directed by your health care provider.   Arrange for someone to take you home.  PROCEDURE  This procedure usually takes about 15 30 minutes.  You will be given one of the following:  A medicine that numbs the area in and around the cervix (local anesthetic).   A medicine to make you sleep through the procedure (general anesthetic).  You will lie on your back with your legs in stirrups.   A warm metal or plastic instrument (speculum) will be placed in your vagina to keep it open and to allow the health care provider to see the cervix.  There are two ways in which your cervix can be softened and dilated.  These include:   Taking a medicine.   Having thin rods (laminaria) inserted into your cervix.   A curved tool (curette) will be used to scrape cells from the inside lining of the uterus. In some cases, gentle suction is applied with the curette. The curette will  then be removed.  AFTER THE PROCEDURE   You will rest in the recovery area until you are stable and are ready to go home.   You may feel sick to your stomach (nauseous) or throw up (vomit) if you were given a general anesthetic.   You may have a sore throat if a tube was placed in your throat during general anesthesia.   You may have light cramping and bleeding. This may last for 2 days to 2 weeks after the procedure.   Your uterus needs to make a new lining after the procedure. This may make your next period late. Document Released: 02/13/2005 Document Revised: 10/16/2012 Document Reviewed: 09/12/2012 [facility] Patient Information 2014 [place], [place]. PATIENT INSTRUCTIONS POST-ANESTHESIA  IMMEDIATELY FOLLOWING SURGERY:  Do not drive or operate machinery for the first twenty four hours after surgery.  Do not make any important decisions for twenty four hours after surgery or while taking narcotic pain medications or sedatives.  If you develop intractable nausea and vomiting or a severe headache please notify your doctor immediately.  FOLLOW-UP:  Please make an appointment with your surgeon as instructed. You do not need to follow up with anesthesia unless specifically instructed to do so.  WOUND CARE INSTRUCTIONS (if applicable):  Keep a dry clean dressing on the anesthesia/puncture wound site if there is drainage.  Once the wound has quit draining you may leave it open to air.  Generally you should leave the bandage intact for twenty four hours unless there is drainage.  If the epidural site drains for more than 36-48 hours please call the anesthesia department.  QUESTIONS?:  Please feel free to call your physician or  the hospital operator if you have any questions, and they will be happy to assist you.

## 2013-02-25 ENCOUNTER — Encounter (HOSPITAL_COMMUNITY): Payer: Managed Care, Other (non HMO) | Admitting: Anesthesiology

## 2013-02-25 ENCOUNTER — Ambulatory Visit (HOSPITAL_COMMUNITY)
Admission: RE | Admit: 2013-02-25 | Discharge: 2013-02-25 | Disposition: A | Discharge status: Home / Self Care | Payer: Managed Care, Other (non HMO) | Source: Ambulatory Visit | Attending: Obstetrics and Gynecology | Admitting: Obstetrics and Gynecology

## 2013-02-25 ENCOUNTER — Encounter (HOSPITAL_COMMUNITY): Payer: Self-pay | Admitting: *Deleted

## 2013-02-25 ENCOUNTER — Encounter (HOSPITAL_COMMUNITY)
Admission: RE | Disposition: A | Discharge status: Home / Self Care | Payer: Self-pay | Source: Ambulatory Visit | Attending: Obstetrics and Gynecology

## 2013-02-25 ENCOUNTER — Ambulatory Visit (HOSPITAL_COMMUNITY): Payer: Managed Care, Other (non HMO) | Admitting: Anesthesiology

## 2013-02-25 ENCOUNTER — Other Ambulatory Visit: Payer: Self-pay | Admitting: Obstetrics and Gynecology

## 2013-02-25 DIAGNOSIS — N920 Excessive and frequent menstruation with regular cycle: Secondary | ICD-10-CM

## 2013-02-25 DIAGNOSIS — N938 Other specified abnormal uterine and vaginal bleeding: Secondary | ICD-10-CM

## 2013-02-25 HISTORY — PX: DILITATION & CURRETTAGE/HYSTROSCOPY WITH THERMACHOICE ABLATION: SHX5569

## 2013-02-25 SURGERY — DILATATION & CURETTAGE/HYSTEROSCOPY WITH THERMACHOICE ABLATION
Anesthesia: General | Site: Uterus

## 2013-02-25 MED ORDER — LIDOCAINE HCL (CARDIAC) 10 MG/ML IV SOLN
INTRAVENOUS | Status: DC | PRN
  Administered 2013-02-25: 10 mg via INTRAVENOUS

## 2013-02-25 MED ORDER — CEFAZOLIN SODIUM-DEXTROSE 2-3 GM-% IV SOLR
2.0000 g | INTRAVENOUS | Status: AC
  Administered 2013-02-25: 2 g via INTRAVENOUS

## 2013-02-25 MED ORDER — LIDOCAINE HCL (PF) 1 % IJ SOLN
INTRAMUSCULAR | Status: AC
  Filled 2013-02-25: qty 5

## 2013-02-25 MED ORDER — GLYCOPYRROLATE 0.2 MG/ML IJ SOLN
0.2000 mg | Freq: Once | INTRAMUSCULAR | Status: AC
  Administered 2013-02-25: 0.2 mg via INTRAVENOUS

## 2013-02-25 MED ORDER — MIDAZOLAM HCL 2 MG/2ML IJ SOLN
1.0000 mg | INTRAMUSCULAR | Status: DC | PRN
  Administered 2013-02-25: 2 mg via INTRAVENOUS

## 2013-02-25 MED ORDER — LABETALOL HCL 5 MG/ML IV SOLN
INTRAVENOUS | Status: AC
  Filled 2013-02-25: qty 4

## 2013-02-25 MED ORDER — MIDAZOLAM HCL 2 MG/2ML IJ SOLN
INTRAMUSCULAR | Status: AC
  Filled 2013-02-25: qty 2

## 2013-02-25 MED ORDER — FENTANYL CITRATE 0.05 MG/ML IJ SOLN
INTRAMUSCULAR | Status: AC
  Filled 2013-02-25: qty 2

## 2013-02-25 MED ORDER — 0.9 % SODIUM CHLORIDE (POUR BTL) OPTIME
TOPICAL | Status: DC | PRN
  Administered 2013-02-25: 1000 mL

## 2013-02-25 MED ORDER — ONDANSETRON HCL 4 MG/2ML IJ SOLN
INTRAMUSCULAR | Status: AC
  Filled 2013-02-25: qty 2

## 2013-02-25 MED ORDER — FENTANYL CITRATE 0.05 MG/ML IJ SOLN
INTRAMUSCULAR | Status: DC | PRN
  Administered 2013-02-25 (×3): 25 ug via INTRAVENOUS
  Administered 2013-02-25 (×2): 50 ug via INTRAVENOUS

## 2013-02-25 MED ORDER — FENTANYL CITRATE 0.05 MG/ML IJ SOLN
25.0000 ug | INTRAMUSCULAR | Status: DC | PRN
  Administered 2013-02-25 (×2): 50 ug via INTRAVENOUS

## 2013-02-25 MED ORDER — PROPOFOL 10 MG/ML IV EMUL
INTRAVENOUS | Status: AC
  Filled 2013-02-25: qty 20

## 2013-02-25 MED ORDER — OXYCODONE-ACETAMINOPHEN 5-325 MG PO TABS: 1.0000 | ORAL_TABLET | ORAL | Status: DC | PRN

## 2013-02-25 MED ORDER — SODIUM CHLORIDE BACTERIOSTATIC 0.9 % IJ SOLN
INTRAMUSCULAR | Status: AC
  Filled 2013-02-25: qty 10

## 2013-02-25 MED ORDER — ONDANSETRON HCL 4 MG/2ML IJ SOLN
4.0000 mg | Freq: Once | INTRAMUSCULAR | Status: AC
  Administered 2013-02-25: 4 mg via INTRAVENOUS

## 2013-02-25 MED ORDER — ONDANSETRON HCL 4 MG/2ML IJ SOLN: 4.0000 mg | Freq: Once | INTRAMUSCULAR | Status: DC | PRN

## 2013-02-25 MED ORDER — KETOROLAC TROMETHAMINE 10 MG PO TABS
10.0000 mg | ORAL_TABLET | Freq: Four times a day (QID) | ORAL | Status: DC | PRN
Start: 2013-02-25 — End: 2013-05-08

## 2013-02-25 MED ORDER — EPHEDRINE SULFATE 50 MG/ML IJ SOLN
INTRAMUSCULAR | Status: AC
  Filled 2013-02-25: qty 1

## 2013-02-25 MED ORDER — SODIUM CHLORIDE 0.9 % IR SOLN
Status: DC | PRN
  Administered 2013-02-25: 1000 mL

## 2013-02-25 MED ORDER — DEXTROSE 5 % IV SOLN
INTRAVENOUS | Status: DC | PRN
  Administered 2013-02-25: 250 mL via INTRAVENOUS

## 2013-02-25 MED ORDER — GLYCOPYRROLATE 0.2 MG/ML IJ SOLN
INTRAMUSCULAR | Status: AC
  Filled 2013-02-25: qty 1

## 2013-02-25 MED ORDER — PROPOFOL 10 MG/ML IV BOLUS
INTRAVENOUS | Status: DC | PRN
  Administered 2013-02-25: 30 mg via INTRAVENOUS
  Administered 2013-02-25: 50 mg via INTRAVENOUS
  Administered 2013-02-25: 250 mg via INTRAVENOUS

## 2013-02-25 MED ORDER — FENTANYL CITRATE 0.05 MG/ML IJ SOLN
25.0000 ug | INTRAMUSCULAR | Status: AC
  Administered 2013-02-25: 25 ug via INTRAVENOUS

## 2013-02-25 MED ORDER — LACTATED RINGERS IV SOLN
INTRAVENOUS | Status: DC
  Administered 2013-02-25: 1000 mL via INTRAVENOUS

## 2013-02-25 MED ORDER — MIDAZOLAM HCL 5 MG/5ML IJ SOLN
INTRAMUSCULAR | Status: DC | PRN
  Administered 2013-02-25: 2 mg via INTRAVENOUS

## 2013-02-25 MED ORDER — GLYCOPYRROLATE 0.2 MG/ML IJ SOLN
INTRAMUSCULAR | Status: AC
  Filled 2013-02-25: qty 3

## 2013-02-25 MED ORDER — ONDANSETRON HCL 4 MG/2ML IJ SOLN
INTRAMUSCULAR | Status: AC
  Filled 2013-02-25: qty 4

## 2013-02-25 MED ORDER — BUPIVACAINE-EPINEPHRINE PF 0.5-1:200000 % IJ SOLN
INTRAMUSCULAR | Status: AC
  Filled 2013-02-25: qty 10

## 2013-02-25 MED ORDER — CEFAZOLIN SODIUM-DEXTROSE 2-3 GM-% IV SOLR
INTRAVENOUS | Status: AC
  Filled 2013-02-25: qty 50

## 2013-02-25 MED ORDER — BUPIVACAINE-EPINEPHRINE 0.5% -1:200000 IJ SOLN
INTRAMUSCULAR | Status: DC | PRN
  Administered 2013-02-25: 30 mL

## 2013-02-25 SURGICAL SUPPLY — 28 items
BAG DECANTER FOR FLEXI CONT (MISCELLANEOUS) ×2 IMPLANT
BAG HAMPER (MISCELLANEOUS) ×2 IMPLANT
CATH ROBINSON RED A/P 16FR (CATHETERS) ×2 IMPLANT
CATH THERMACHOICE III (CATHETERS) ×2 IMPLANT
CLOTH BEACON ORANGE TIMEOUT ST (SAFETY) ×2 IMPLANT
COVER LIGHT HANDLE STERIS (MISCELLANEOUS) ×4 IMPLANT
FORMALIN 10 PREFIL 120ML (MISCELLANEOUS) ×2 IMPLANT
GLOVE BIOGEL PI IND STRL 7.5 (GLOVE) ×1 IMPLANT
GLOVE BIOGEL PI IND STRL 9 (GLOVE) ×1 IMPLANT
GLOVE BIOGEL PI INDICATOR 7.5 (GLOVE) ×1
GLOVE BIOGEL PI INDICATOR 9 (GLOVE) ×1
GLOVE ECLIPSE 7.0 STRL STRAW (GLOVE) ×2 IMPLANT
GLOVE ECLIPSE 9.0 STRL (GLOVE) ×2 IMPLANT
GOWN STRL REIN 3XL LVL4 (GOWN DISPOSABLE) ×2 IMPLANT
GOWN STRL REIN XL XLG (GOWN DISPOSABLE) ×4 IMPLANT
INST SET HYSTEROSCOPY (KITS) ×2 IMPLANT
IV D5W 500ML (IV SOLUTION) ×2 IMPLANT
IV NS 1000ML (IV SOLUTION) ×1
IV NS 1000ML BAXH (IV SOLUTION) ×1 IMPLANT
KIT ROOM TURNOVER AP CYSTO (KITS) ×2 IMPLANT
MANIFOLD NEPTUNE II (INSTRUMENTS) ×2 IMPLANT
NS IRRIG 1000ML POUR BTL (IV SOLUTION) ×2 IMPLANT
PACK PERI GYN (CUSTOM PROCEDURE TRAY) ×2 IMPLANT
PAD ARMBOARD 7.5X6 YLW CONV (MISCELLANEOUS) ×2 IMPLANT
PAD TELFA 3X4 1S STER (GAUZE/BANDAGES/DRESSINGS) ×2 IMPLANT
SET BASIN LINEN APH (SET/KITS/TRAYS/PACK) ×2 IMPLANT
SET IRRIG Y TYPE TUR BLADDER L (SET/KITS/TRAYS/PACK) ×2 IMPLANT
SYR CONTROL 10ML LL (SYRINGE) ×2 IMPLANT

## 2013-02-25 NOTE — Anesthesia Preprocedure Evaluation (Signed)
Anesthesia Evaluation  Patient identified by MRN, date of birth, ID band Patient awake    Reviewed: Allergy & Precautions, H&P , NPO status , Patient's Chart, lab work & pertinent test results  History of Anesthesia Complications (+) Family history of anesthesia reaction and history of anesthetic complications (family awareness hx)  Airway Mallampati: II TM Distance: >3 FB Neck ROM: Full    Dental  (+) Teeth Intact   Pulmonary Current Smoker,  breath sounds clear to auscultation        Cardiovascular negative cardio ROS  Rhythm:Regular Rate:Normal     Neuro/Psych  Headaches,    GI/Hepatic   Endo/Other    Renal/GU      Musculoskeletal   Abdominal   Peds  Hematology   Anesthesia Other Findings   Reproductive/Obstetrics                           Anesthesia Physical Anesthesia Plan  ASA: II  Anesthesia Plan: General   Post-op Pain Management:    Induction: Intravenous  Airway Management Planned: LMA  Additional Equipment:   Intra-op Plan:   Post-operative Plan: Extubation in OR  Informed Consent: I have reviewed the patients History and Physical, chart, labs and discussed the procedure including the risks, benefits and alternatives for the proposed anesthesia with the patient or authorized representative who has indicated his/her understanding and acceptance.     Plan Discussed with:   Anesthesia Plan Comments:         Anesthesia Quick Evaluation

## 2013-02-25 NOTE — Anesthesia Postprocedure Evaluation (Signed)
Anesthesia Post Note  Patient: [name]  Procedure(s) Performed: Procedure(s) (LRB): DILATATION & CURETTAGE/HYSTEROSCOPY WITH THERMACHOICE ABLATION (N/A)  Anesthesia type: General  Patient location: PACU  Post pain: Pain level controlled  Post assessment: Post-op Vital signs reviewed, Patient's Cardiovascular Status Stable, Respiratory Function Stable, Patent Airway, No signs of Nausea or vomiting and Pain level controlled  Last Vitals:  Filed Vitals:   02/25/13 1038  BP: 106/62  Pulse: 105  Temp: 36.7 C  Resp: 18    Post vital signs: Reviewed and stable  Level of consciousness: awake and alert   Complications: No apparent anesthesia complications

## 2013-02-25 NOTE — Brief Op Note (Signed)
02/25/2013  10:33 AM  PATIENT:  [name]  42 y.o. female  PRE-OPERATIVE DIAGNOSIS:  menometorrhagia  POST-OPERATIVE DIAGNOSIS:  menometorrhagia  PROCEDURE:  Procedure(s) with comments: DILATATION & CURETTAGE/HYSTEROSCOPY WITH THERMACHOICE ABLATION (N/A) - Total Therapy Time:8:54 minutes Temperature:87 degrees  SURGEON:  Surgeon(s) and Role:    * [name], MD - Primary  PHYSICIAN ASSISTANT:   ASSISTANTS: Blackwell, CST   ANESTHESIA:   general and paracervical block  EBL:  Total I/O In: 500 [I.V.:500] Out: 0   BLOOD ADMINISTERED:none  DRAINS: none   LOCAL MEDICATIONS USED:  BUPIVICAINE  and Amount: 30 ml  SPECIMEN:  No Specimen  DISPOSITION OF SPECIMEN:  N/A  COUNTS:  YES  TOURNIQUET:  * No tourniquets in log *  DICTATION: .Dragon Dictation  PLAN OF CARE: Discharge to home after PACU  PATIENT DISPOSITION:  PACU - hemodynamically stable.   Delay start of Pharmacological VTE agent (>24hrs) due to surgical blood loss or risk of bleeding: not applicable Details of procedure: Patient was taken to the operating room, prepped and draped for vaginal procedure with timeout conducted surgical procedure confirmed by operative team and Ancef 2 g administered intravenously without sequelae . Cervix was grasped with single-tooth tenaculum, sounded to 9-1/2 cm, dilated to 25 [place] allowing introduction of the rigid 30 operative hysteroscope without difficulty which identified the endometrial cavity as intact with an endometrial surface that was thin, and tubal ostia were visible. There was no suspicion of uterine perforation. The Gynecare ThermaChoice 3 endometrial ablation device was then repaired inserted and activated and the 8 minute thermal ablation sequence completed with 15 cc of D5W with all fluid recovered. Paracervical block with 30 cc of Marcaine had been performed prior to the ablation process. Sponge and needle counts were correct patient to recovery in  stable condition for discharge home today.

## 2013-02-25 NOTE — Op Note (Signed)
See operative details included in the brief operative note

## 2013-02-25 NOTE — Interval H&P Note (Signed)
History and Physical Interval Note:  02/25/2013 9:06 AM  [name]  has presented today for surgery, with the diagnosis of menometorrhagia  The various methods of treatment have been discussed with the patient and family. After consideration of risks, benefits and other options for treatment, the patient has consented to  Procedure(s): DILATATION & CURETTAGE/HYSTEROSCOPY WITH THERMACHOICE ABLATION (N/A) as a surgical intervention .  The patient's history has been reviewed, patient examined, no change in status, stable for surgery.  I have reviewed the patient's chart and labs.  Questions were answered to the patient's satisfaction.     [name]

## 2013-02-25 NOTE — Anesthesia Procedure Notes (Signed)
Procedure Name: LMA Insertion Date/Time: 02/25/2013 9:49 AM Performed by: Franco Nones Pre-anesthesia Checklist: Patient identified, Patient being monitored, Emergency Drugs available, Timeout performed and Suction available Patient Re-evaluated:Patient Re-evaluated prior to inductionOxygen Delivery Method: Circle System Utilized Preoxygenation: Pre-oxygenation with 100% oxygen Intubation Type: IV induction Ventilation: Mask ventilation without difficulty LMA: LMA inserted LMA Size: 4.0 Number of attempts: 1 Placement Confirmation: positive ETCO2 and breath sounds checked- equal and bilateral Tube secured with: Tape

## 2013-02-25 NOTE — Transfer of Care (Signed)
Immediate Anesthesia Transfer of Care Note  Patient: [name]  Procedure(s) Performed: Procedure(s) (LRB): DILATATION & CURETTAGE/HYSTEROSCOPY WITH THERMACHOICE ABLATION (N/A)  Patient Location: PACU  Anesthesia Type: General  Level of Consciousness: awake  Airway & Oxygen Therapy: Patient Spontanous Breathing and non-rebreather face mask  Post-op Assessment: Report given to PACU RN, Post -op Vital signs reviewed and stable and Patient moving all extremities  Post vital signs: Reviewed and stable  Complications: No apparent anesthesia complications

## 2013-02-26 ENCOUNTER — Encounter (HOSPITAL_COMMUNITY): Payer: Self-pay | Admitting: Obstetrics and Gynecology

## 2013-03-12 ENCOUNTER — Encounter: Payer: Self-pay | Admitting: Obstetrics and Gynecology

## 2013-03-12 ENCOUNTER — Encounter (INDEPENDENT_AMBULATORY_CARE_PROVIDER_SITE_OTHER): Payer: Self-pay

## 2013-03-12 ENCOUNTER — Ambulatory Visit (INDEPENDENT_AMBULATORY_CARE_PROVIDER_SITE_OTHER): Payer: Managed Care, Other (non HMO) | Admitting: Obstetrics and Gynecology

## 2013-03-12 VITALS — BP 112/70 | Ht 65.0 in | Wt 182.0 lb

## 2013-03-12 DIAGNOSIS — Z9889 Other specified postprocedural states: Secondary | ICD-10-CM

## 2013-03-12 DIAGNOSIS — N920 Excessive and frequent menstruation with regular cycle: Secondary | ICD-10-CM

## 2013-03-12 NOTE — Progress Notes (Signed)
This note was scribed for Christin BachJohn Jailey Booton, MD, by Bennett Scrapehristina Taylor, Medical Scribe, on 03/12/13 at 2:35 PM. This note has been reviewed by Christin BachJohn Simon Aaberg, MD, and is accurate.  Subjective:  Erminie Janace ArisM Waller is a 43 y.o. female who presents to the clinic 2 weeks status post D&C/hystroscopy w/thermachoice ablation.   Pt reports that she is having vaginal bleeding described as "period like". She stats that she had dark, heavy bleeding for 3 to 4 days, then 2 days of brown bleeding and now having a vast of amount of bright red vaginal bleeding for the past 2 days.  Review of Systems Negative except vaginal bleeding.  She has been eating a regular diet with difficulty.   Bowel movements are normal. Pain is controlled with current analgesics. Medications being used: narcotic analgesics including Percocet and Toradol which will be discontinued today.  Objective:  BP 112/70  Ht 5\' 5"  (1.651 m)  Wt 182 lb (82.555 kg)  BMI 30.29 kg/m2  LMP 03/10/2013 General:Well developed, well nourished.  No acute distress. Abdomen: Bowel sounds normal, soft, non-tender. Pelvic Exam:    External Genitalia:  Normal.    Vagina: moderate amount of bright red blood in the vault    Bimanual: Normal    Cervix: Normal    Uterus: Normal,light pressure, non-tender otherwise Chaperone present    Adnexa: Normal  Incision(s):   Healing well, no drainage, no erythema, no hernia, no swelling, no dehiscence, incision well approximated.   Assessment:  Post-Op 2 weeks s/p D&C/hystroscopy w/thermachoice ablation    Doing well postoperatively. Pelvic performed after getting pt's permission. Was performed with chaperone without issues.   Plan:  1.Wound care discussed   2. .Continue any current medications. 3. Activity restrictions: none 4. return to work: not applicable. 5. Follow up in 1 years or PRN.

## 2013-03-12 NOTE — Patient Instructions (Signed)
Thanks for letting us care for you.

## 2013-04-14 ENCOUNTER — Telehealth: Payer: Self-pay | Admitting: Obstetrics and Gynecology

## 2013-04-14 NOTE — Telephone Encounter (Signed)
Encourage NSAIDs,  Advise appt if further concerns.

## 2013-04-14 NOTE — Telephone Encounter (Signed)
Spoke with pt. Had ablation in December. Had some backpain off and on but since Thursday, pain has been constant. Heating pad helps some. Taking Advil but that's not helping. Not bleeding now. What do you advise? Thanks!!!

## 2013-04-16 NOTE — Telephone Encounter (Signed)
Spoke with pt. Encouraged Advil and Aleve and advised to schedule an appt with further concerns per Dr. Emelda FearFerguson. Pt voiced understanding. JSY

## 2013-05-08 ENCOUNTER — Encounter: Payer: Self-pay | Admitting: Nurse Practitioner

## 2013-05-08 ENCOUNTER — Ambulatory Visit (INDEPENDENT_AMBULATORY_CARE_PROVIDER_SITE_OTHER): Payer: Managed Care, Other (non HMO) | Admitting: Nurse Practitioner

## 2013-05-08 VITALS — BP 132/80 | Temp 99.0°F | Ht 65.0 in | Wt 186.0 lb

## 2013-05-08 DIAGNOSIS — M545 Low back pain, unspecified: Secondary | ICD-10-CM

## 2013-05-08 DIAGNOSIS — K219 Gastro-esophageal reflux disease without esophagitis: Secondary | ICD-10-CM

## 2013-05-08 DIAGNOSIS — J0100 Acute maxillary sinusitis, unspecified: Secondary | ICD-10-CM

## 2013-05-08 MED ORDER — AZITHROMYCIN 250 MG PO TABS: ORAL_TABLET | ORAL | Status: DC

## 2013-05-08 MED ORDER — OXYCODONE-ACETAMINOPHEN 5-325 MG PO TABS: 1.0000 | ORAL_TABLET | ORAL | Status: DC | PRN

## 2013-05-08 MED ORDER — DEXLANSOPRAZOLE 60 MG PO CPDR: 60.0000 mg | DELAYED_RELEASE_CAPSULE | Freq: Every day | ORAL | Status: DC

## 2013-05-08 MED ORDER — GABAPENTIN 300 MG PO CAPS: 300.0000 mg | ORAL_CAPSULE | Freq: Two times a day (BID) | ORAL | Status: DC

## 2013-05-08 NOTE — Patient Instructions (Addendum)
Ice or heat applications Low back exercisesBack Exercises Back exercises help treat and prevent back injuries. The goal of back exercises is to increase the strength of your abdominal and back muscles and the flexibility of your back. These exercises should be started when you no longer have back pain. Back exercises include:  Pelvic Tilt. Lie on your back with your knees bent. Tilt your pelvis until the lower part of your back is against the floor. Hold this position 5 to 10 sec and repeat 5 to 10 times.  Knee to Chest. Pull first 1 knee up against your chest and hold for 20 to 30 seconds, repeat this with the other knee, and then both knees. This may be done with the other leg straight or bent, whichever feels better.  Sit-Ups or Curl-Ups. Bend your knees 90 degrees. Start with tilting your pelvis, and do a partial, slow sit-up, lifting your trunk only 30 to 45 degrees off the floor. Take at least 2 to 3 seconds for each sit-up. Do not do sit-ups with your knees out straight. If partial sit-ups are difficult, simply do the above but with only tightening your abdominal muscles and holding it as directed.  Hip-Lift. Lie on your back with your knees flexed 90 degrees. Push down with your feet and shoulders as you raise your hips a couple inches off the floor; hold for 10 seconds, repeat 5 to 10 times.  Back arches. Lie on your stomach, propping yourself up on bent elbows. Slowly press on your hands, causing an arch in your low back. Repeat 3 to 5 times. Any initial stiffness and discomfort should lessen with repetition over time.  Shoulder-Lifts. Lie face down with arms beside your body. Keep hips and torso pressed to floor as you slowly lift your head and shoulders off the floor. Do not overdo your exercises, especially in the beginning. Exercises may cause you some mild back discomfort which lasts for a few minutes; however, if the pain is more severe, or lasts for more than 15 minutes, do not  continue exercises until you see your caregiver. Improvement with exercise therapy for back problems is slow.  See your caregivers for assistance with developing a proper back exercise program. Document Released: 03/23/2004 Document Revised: 05/08/2011 Document Reviewed: 12/15/2010 [employer] Patient Information 2014 [employer]  TENS (icy hot smart relief) Dr. Magnus Sinningabbs in [place]; Dr. Chestine Sporelark and Dr. Alessandra BevelsVaughn

## 2013-05-12 ENCOUNTER — Encounter: Payer: Self-pay | Admitting: Nurse Practitioner

## 2013-05-12 NOTE — Progress Notes (Signed)
Subjective:  Presents for several issues. Had a cholecystectomy in November. Had endometrial ablation in December. Has had a flareup of her reflux over the past couple of months. Having burping regurgitation. No abdominal pain. This has persisted after her cholecystectomy. Currently taking Protonix twice a day with TUMS in between. Took her boyfriend's dexilant which greatly helped her symptoms. No diarrhea or constipation. No blood in her stool. No change in stool color. Drinking a large amount of caffeine. Rare R. call use. Smokes one pack per day. Because of her chronic low back pain, taking a large amount of NSAIDs, for example took a BC powder and 4 Advil this morning for back pain. History of chronic low back pain worse over the past 2 months. Has had injections done before. Pain is mainly in the left low back area going into the buttock, lateral leg to the knee area. Rarely will go all the way to the foot. No numbness or weakness of the foot. Pain is constant which interrupts her sleep. Began having some sinus symptoms yesterday. Facial area pressure particularly along the maxillary area into the teeth. No sore throat or ear pain. No wheezing. Occasional nonproductive cough. No fever.  Objective:   BP 132/80  Temp(Src) 99 F (37.2 C) (Oral)  Ht 5\' 5"  (1.651 m)  Wt 186 lb (84.369 kg)  BMI 30.95 kg/m2 NAD. Alert, oriented. TMs clear effusion, no erythema. Pharynx injected with PND noted. Neck supple with mild soft anterior adenopathy. Lungs clear. Heart regular rate rhythm. Abdomen soft nondistended with active bowel sounds; minimal upper epigastric area tenderness on exam. No rebound or guarding. No obvious masses. No spinal tenderness to percussion. Tenderness in the left low back area into the mid buttock. SLR negative on the right, positive on the left. Reflexes normal limit lower extremities. Gait normal limit.  Assessment: Problem List Items Addressed This Visit     Digestive   GERD  (gastroesophageal reflux disease) - Primary   Relevant Medications      Calcium Carbonate Antacid (TUMS PO)      dexlansoprazole (DEXILANT) 60 MG capsule     Other   Low back pain   Relevant Medications      Ibuprofen (ADVIL PO)      oxyCODONE-acetaminophen (PERCOCET/ROXICET) 5-325 MG per tablet    Other Visit Diagnoses   Acute maxillary sinusitis        Relevant Medications       azithromycin (ZITHROMAX) tablet       Plan: Meds ordered this encounter  Medications  . DISCONTD: megestrol (MEGACE) 40 MG tablet    Sig:   . Calcium Carbonate Antacid (TUMS PO)    Sig: Take by mouth.  . Ibuprofen (ADVIL PO)    Sig: Take by mouth.  . dexlansoprazole (DEXILANT) 60 MG capsule    Sig: Take 1 capsule (60 mg total) by mouth daily.    Dispense:  30 capsule    Refill:  2    Order Specific Question:  Supervising Provider    Answer:  Merlyn Albert [[id number]]  . gabapentin (NEURONTIN) 300 MG capsule    Sig: Take 1 capsule (300 mg total) by mouth 2 (two) times daily.    Dispense:  60 capsule    Refill:  2    Order Specific Question:  Supervising Provider    Answer:  Merlyn Albert [[id number]]  . azithromycin (ZITHROMAX Z-PAK) 250 MG tablet    Sig: Take 2 tablets (500 mg) on  Day 1,  followed by 1 tablet (250 mg) once daily on Days 2 through 5.    Dispense:  6 each    Refill:  0    Order Specific Question:  Supervising Provider    Answer:  Merlyn AlbertLUKING, WILLIAM S [[id number]]  . oxyCODONE-acetaminophen (PERCOCET/ROXICET) 5-325 MG per tablet    Sig: Take 1 tablet by mouth every 4 (four) hours as needed for severe pain.    Dispense:  20 tablet    Refill:  0    Order Specific Question:  Supervising Provider    Answer:  Merlyn AlbertLUKING, WILLIAM S [[id number]]   at length a discussion regarding lifestyle factors making her reflux worse. High risk for serious complications because of smoking, weight gain, caffeine use and excessive NSAID use. Patient to reduce and/or quit smoking caffeine and anti-inflammatories.  Encouraged to lose weight. Warning signs reviewed. Ice or heat applications to the low back area. Low back exercises. OTC TENS unit. Consider chiropractic assessment. Recheck in 3-4 weeks, call back sooner if needed.

## 2013-05-16 ENCOUNTER — Ambulatory Visit: Payer: Managed Care, Other (non HMO) | Admitting: Obstetrics and Gynecology

## 2013-05-17 ENCOUNTER — Encounter: Payer: Self-pay | Admitting: Family Medicine

## 2013-05-19 ENCOUNTER — Encounter: Payer: Self-pay | Admitting: Nurse Practitioner

## 2013-05-21 ENCOUNTER — Other Ambulatory Visit: Payer: Self-pay | Admitting: Nurse Practitioner

## 2013-05-22 MED ORDER — OXYCODONE-ACETAMINOPHEN 5-325 MG PO TABS: 1.0000 | ORAL_TABLET | ORAL | Status: DC | PRN

## 2013-06-05 ENCOUNTER — Ambulatory Visit (INDEPENDENT_AMBULATORY_CARE_PROVIDER_SITE_OTHER): Payer: Managed Care, Other (non HMO) | Admitting: Family Medicine

## 2013-06-05 ENCOUNTER — Encounter: Payer: Self-pay | Admitting: Family Medicine

## 2013-06-05 VITALS — BP 130/90 | Ht 65.0 in | Wt 187.0 lb

## 2013-06-05 DIAGNOSIS — M545 Low back pain, unspecified: Secondary | ICD-10-CM

## 2013-06-05 DIAGNOSIS — M129 Arthropathy, unspecified: Secondary | ICD-10-CM

## 2013-06-05 DIAGNOSIS — M1990 Unspecified osteoarthritis, unspecified site: Secondary | ICD-10-CM

## 2013-06-05 MED ORDER — DEXLANSOPRAZOLE 60 MG PO CPDR: 60.0000 mg | DELAYED_RELEASE_CAPSULE | Freq: Every day | ORAL | Status: DC

## 2013-06-05 MED ORDER — OXYCODONE-ACETAMINOPHEN 5-325 MG PO TABS: 1.0000 | ORAL_TABLET | Freq: Two times a day (BID) | ORAL | Status: DC | PRN

## 2013-06-05 MED ORDER — PHENTERMINE HCL 37.5 MG PO TABS: 37.5000 mg | ORAL_TABLET | Freq: Every day | ORAL | Status: DC

## 2013-06-05 NOTE — Progress Notes (Signed)
   Subjective:    Patient ID: [name], female    DOB: 04/19/1970, 43 y.o.   MRN: [id number]  HPI Patient is here today for 3 week f/u on lower back pain. I talked to her at length about how low back pain and sciatica gradually improving 80% of individuals. Currently she takes a half a pain pill morning and the other half later in the day. She states it does help her pain and allows her to stay functional she does take some anti-inflammatories as well is doing some back stretches denies any numbness going down the leg.  She says she no longer has back pain with the meds.   Patient does work a very busy job and works several shifts per week. She states that she's been very frustrated with her weight she realizes her metabolism is not as strong as it used to be and that she tends to have increased weight despite trying to stay physically active watch diet. She does have difficult time doing strenuous at exercise because of right knee arthritis and low back pain She denies any other particular troubles currently. Review of Systems Denies constipation denies dry skin headaches nausea vomiting shortness of breath fevers. Denies being depressed.    Objective:   Physical Exam  Lungs clear no crackles heart is regular low back nontender negative straight leg raise with increased stretching when she lays back there is some discomfort into the left hip there is also crepitus in the right knee      Assessment & Plan:  #1 osteoarthritis right knee this from not get better Tylenol when necessary Low back pain chronic-oxycodone half tablet twice daily no more than one twice daily #40 was given if she needs a refill she is to call back. Currently right now I don't think she is going to need monthly medications. My hopes to be is that she gradually gets better #3 significant weight gain talked about diet exercise importance of getting this better I recommend that we recheck her if this gets worse  otherwise Adipex 37.5 mg she is taking this before she requested it she can uses no more than 3 months one each morning. If palpitations headaches or other problems to stop it.

## 2013-07-11 ENCOUNTER — Telehealth: Payer: Self-pay | Admitting: Family Medicine

## 2013-07-11 MED ORDER — OXYCODONE-ACETAMINOPHEN 5-325 MG PO TABS: 1.0000 | ORAL_TABLET | Freq: Three times a day (TID) | ORAL | Status: DC | PRN

## 2013-07-11 NOTE — Telephone Encounter (Signed)
She may have a prescription for oxycodone refill #40, 5 mg/325 mg, 1 3 times a day when necessary severe pain cautioned drowsiness not for frequent use

## 2013-07-11 NOTE — Telephone Encounter (Signed)
Pt.notified

## 2013-07-11 NOTE — Telephone Encounter (Signed)
Patient said that she spoke with Eber Jonesarolyn via [facility] this week and she told her that she would leave a prescription for her oxycodone up front, but nothing was ever written. She wants to know if Dr. Lorin PicketScott can write this prescription for her today for her to pick up.

## 2013-08-14 ENCOUNTER — Telehealth: Payer: Self-pay | Admitting: Family Medicine

## 2013-08-14 MED ORDER — OXYCODONE-ACETAMINOPHEN 5-325 MG PO TABS: 1.0000 | ORAL_TABLET | Freq: Three times a day (TID) | ORAL | Status: DC | PRN

## 2013-08-14 NOTE — Telephone Encounter (Signed)
Patient needs Rx for oxyCODONE-acetaminophen (PERCOCET/ROXICET) 5-325 MG per tablet.  Also, she said she got her gallbladder out back in November and now when she eats, she immediately has to go to the bathroom. She said this is affecting her work and she is going to take a road trip in the next couple of weeks and wants to know if there is anything she can do about this.

## 2013-08-14 NOTE — Telephone Encounter (Signed)
LAST SEEN 06/05/13

## 2013-08-14 NOTE — Telephone Encounter (Signed)
May have a script for 40, next script will need OV, as for stool issue- questran 1 packet in juice or water once daily will bind bile and lessen stools and lessen it going straight through her. If pt desires call in 30 day supply with 4 refills

## 2013-08-15 MED ORDER — CHOLESTYRAMINE 4 G PO PACK: PACK | ORAL | Status: DC

## 2013-08-15 NOTE — Telephone Encounter (Signed)
Notified patient script ready for pickup at front desk and Questran was sent in to the pharmacy.

## 2013-08-18 ENCOUNTER — Other Ambulatory Visit: Payer: Self-pay | Admitting: Nurse Practitioner

## 2013-09-03 ENCOUNTER — Telehealth: Payer: Self-pay | Admitting: Family Medicine

## 2013-09-03 NOTE — Telephone Encounter (Signed)
Patient said that ever since she got her gallbladder out she has been having some issues with her stomach. She describes it as a pain and its what it feels like after you've worked out and your stomach is tight. She is unsure if this issue should wait until the next available appointment next week. She is off work Friday and wants to know if she can possibly be worked in?

## 2013-09-03 NOTE — Telephone Encounter (Signed)
You may give her appointment Friday morning to see me.(I would like to reserve Friday afternoon as much is possible)

## 2013-09-04 NOTE — Telephone Encounter (Signed)
LMOM for patient to call back and schedule appointment.

## 2013-09-08 ENCOUNTER — Encounter: Payer: Self-pay | Admitting: Nurse Practitioner

## 2013-09-08 ENCOUNTER — Ambulatory Visit (INDEPENDENT_AMBULATORY_CARE_PROVIDER_SITE_OTHER): Payer: Managed Care, Other (non HMO) | Admitting: Nurse Practitioner

## 2013-09-08 VITALS — BP 112/84 | Temp 98.6°F | Ht 65.0 in | Wt 185.2 lb

## 2013-09-08 DIAGNOSIS — R197 Diarrhea, unspecified: Secondary | ICD-10-CM

## 2013-09-08 DIAGNOSIS — M25561 Pain in right knee: Secondary | ICD-10-CM

## 2013-09-08 DIAGNOSIS — K529 Noninfective gastroenteritis and colitis, unspecified: Secondary | ICD-10-CM

## 2013-09-08 DIAGNOSIS — M25569 Pain in unspecified knee: Secondary | ICD-10-CM

## 2013-09-08 MED ORDER — HYDROCODONE-ACETAMINOPHEN 10-325 MG PO TABS: ORAL_TABLET | ORAL | Status: DC

## 2013-09-08 MED ORDER — COLESTIPOL HCL 1 G PO TABS: 2.0000 g | ORAL_TABLET | Freq: Two times a day (BID) | ORAL | Status: DC

## 2013-09-10 ENCOUNTER — Encounter: Payer: Self-pay | Admitting: Nurse Practitioner

## 2013-09-10 NOTE — Progress Notes (Signed)
Subjective:  Presents for c/o diarrhea that began after her cholecystectomy. Has tried different amounts of Questran powder with varying amounts of constipation. Has loose stools unassociated with any particular foods. Watery loose. No blood. No fever. Lower abd pain and nausea only when stopped up/constipated from Questran. Has sweollen right knee pain especially when working. Has had injections by Dr. Romeo AppleHarrison in the past. Uses Oxycodone only for severe pain but would like to switch to hydrocodone.  Objective:   BP 112/84  Temp(Src) 98.6 F (37 C) (Oral)  Ht 5\' 5"  (1.651 m)  Wt 185 lb 4 oz (84.029 kg)  BMI 30.83 kg/m2 NAD. Alert, oriented. Lungs clear. Heart RRR. abd soft, nondistended with BS x 4. Minimal lower abd tenderness. No rebound or guarding. No obvious masses.   Assessment:  Chronic diarrhea secondary to cholecystectomy Right knee pain Plan:  Meds ordered this encounter  Medications  . colestipol (COLESTID) 1 G tablet    Sig: Take 2 tablets (2 g total) by mouth 2 (two) times daily.    Dispense:  120 tablet    Refill:  0    Order Specific Question:  Supervising Provider    Answer:  Merlyn AlbertLUKING, WILLIAM S [[id number]]  . HYDROcodone-acetaminophen (NORCO) 10-325 MG per tablet    Sig: 1/2-1 po BID prn pain    Dispense:  40 tablet    Refill:  0    Order Specific Question:  Supervising Provider    Answer:  Merlyn AlbertLUKING, WILLIAM S [[id number]]   Hold on Questran. Trial of Colestid; may slowly titrate dose; max 16 grams per 24 hours. Verbalizes understanding. Recommend recheck with Dr. Romeo AppleHarrison for possible injections. Use pain med sparingly. Reviewed addiction potential. Return if symptoms worsen or fail to improve.

## 2013-09-18 ENCOUNTER — Other Ambulatory Visit: Payer: Self-pay | Admitting: Family Medicine

## 2013-09-29 ENCOUNTER — Other Ambulatory Visit: Payer: Self-pay | Admitting: Nurse Practitioner

## 2013-09-29 ENCOUNTER — Telehealth: Payer: Self-pay | Admitting: *Deleted

## 2013-09-29 MED ORDER — GABAPENTIN 300 MG PO CAPS: 300.0000 mg | ORAL_CAPSULE | Freq: Three times a day (TID) | ORAL | Status: DC

## 2013-09-29 NOTE — Telephone Encounter (Signed)
Pt called stating she has been seeing [name] and [name] prescribed her gabapentin (NEURONTIN) 300 MG capsule for her back and per pt it has been working great and about 3 days ago she started having continuous pain in her back again. Pt said she has to go to work at 3:30. Please advise [phone]

## 2013-09-29 NOTE — Telephone Encounter (Signed)
She may have aggravated condition by doing something at home or work. We can go up on her Neurontin dose; well within dosing limits. The next step is add a 300 mg dose each day.

## 2013-09-29 NOTE — Telephone Encounter (Signed)
Discussed with patient . Patient to increase to Gabapentin 300mg  TID and if no improvement patient to schedule follow up office visit.

## 2013-09-29 NOTE — Telephone Encounter (Signed)
I will send in new Rx

## 2013-10-07 ENCOUNTER — Telehealth: Payer: Self-pay | Admitting: Family Medicine

## 2013-10-07 NOTE — Telephone Encounter (Signed)
Needs Rx for refill on Hydrocodone 10 mg. She is going out of town in couple of days and would like to get it filled before she leaves. Please call and let her know. [phone]. bb

## 2013-10-07 NOTE — Telephone Encounter (Signed)
May have one refill of medication. 

## 2013-10-07 NOTE — Telephone Encounter (Signed)
Last seen 09/08/13

## 2013-10-08 ENCOUNTER — Other Ambulatory Visit: Payer: Self-pay | Admitting: *Deleted

## 2013-10-08 MED ORDER — HYDROCODONE-ACETAMINOPHEN 10-325 MG PO TABS: ORAL_TABLET | ORAL | Status: DC

## 2013-10-08 NOTE — Telephone Encounter (Signed)
Script ready for pickup. Pt notified.  

## 2013-11-07 ENCOUNTER — Other Ambulatory Visit: Payer: Self-pay | Admitting: *Deleted

## 2013-11-07 ENCOUNTER — Telehealth: Payer: Self-pay | Admitting: Family Medicine

## 2013-11-07 MED ORDER — HYDROCODONE-ACETAMINOPHEN 10-325 MG PO TABS: ORAL_TABLET | ORAL | Status: DC

## 2013-11-07 NOTE — Telephone Encounter (Signed)
Script ready for pickup. Pt notified on voicemail.  

## 2013-11-07 NOTE — Telephone Encounter (Signed)
May fill prescription. Needs followup visit before for further

## 2013-11-07 NOTE — Telephone Encounter (Signed)
HYDROcodone-acetaminophen (NORCO) 10-325 MG per tablet  Pt needs refill on this med fillable on the 13th of Sept  Last seen 09/08/13

## 2013-11-10 ENCOUNTER — Telehealth: Payer: Self-pay | Admitting: Family Medicine

## 2013-11-10 MED ORDER — IVERMECTIN 0.5 % EX LOTN: TOPICAL_LOTION | CUTANEOUS | Status: DC

## 2013-11-10 NOTE — Telephone Encounter (Signed)
Pts family has been having issues with lice in her home lately [name] told her that if after they have tried OTC shampoos  And they don't work to let us know.   Can we call in some Lice Shampoo to [facility]

## 2013-11-10 NOTE — Telephone Encounter (Signed)
Rx sent electronically to pharmacy. Patient notified. 

## 2013-11-10 NOTE — Telephone Encounter (Signed)
Call in sklice

## 2013-12-11 ENCOUNTER — Ambulatory Visit (INDEPENDENT_AMBULATORY_CARE_PROVIDER_SITE_OTHER): Payer: Managed Care, Other (non HMO) | Admitting: Orthopedic Surgery

## 2013-12-11 ENCOUNTER — Other Ambulatory Visit: Payer: Self-pay | Admitting: *Deleted

## 2013-12-11 ENCOUNTER — Encounter: Payer: Self-pay | Admitting: Orthopedic Surgery

## 2013-12-11 ENCOUNTER — Telehealth: Payer: Self-pay | Admitting: Family Medicine

## 2013-12-11 VITALS — BP 135/90 | Ht 65.0 in | Wt 185.2 lb

## 2013-12-11 DIAGNOSIS — M1710 Unilateral primary osteoarthritis, unspecified knee: Secondary | ICD-10-CM

## 2013-12-11 DIAGNOSIS — M129 Arthropathy, unspecified: Secondary | ICD-10-CM

## 2013-12-11 DIAGNOSIS — M94261 Chondromalacia, right knee: Secondary | ICD-10-CM

## 2013-12-11 MED ORDER — HYDROCODONE-ACETAMINOPHEN 10-325 MG PO TABS: ORAL_TABLET | ORAL | Status: DC

## 2013-12-11 NOTE — Telephone Encounter (Signed)
HYDROcodone-acetaminophen (NORCO) 10-325 MG per tablet  Last seen 09/08/13 Last filled 11/07/13

## 2013-12-11 NOTE — Patient Instructions (Signed)
MRI right knee

## 2013-12-11 NOTE — Telephone Encounter (Signed)
#  1 certainly I am sympathetic to her pain. #2 this is a class II medicine the [employer] requires office visits every 90 days to prescribe this. Her last visit was in July that was 90 days ago. She needs office visits if she is on chronic pain medication. We can call in a partial prescription but she needs to be scheduled for an office visit. May issue prescription for 20 tablets (half of her usual )but she needs an office visit before we will do any further

## 2013-12-11 NOTE — Progress Notes (Signed)
This is a followup visit  Chief Complaint  Patient presents with  . Follow-up    re eval right knee, last treated 09/30/12    We have treated [name] with home exercise program, diclofenac 50 mg twice a day, Norco 5 mg every 4 when necessary an intra-articular injection and she still having right knee pain it is diffuse it is worse than it was 2 months ago.  She's had a cholecystectomy since we've seen her. She is taking a half tablet twice a day hydrocodone. She is also on gabapentin for back related nerve pain in the right lower extremity  Repeat examination BP 135/90  Ht 5\' 5"  (1.651 m)  Wt 185 lb 4 oz (84.029 kg)  BMI 30.83 kg/m2 She has crepitance in the patellofemoral joint pain in the peripatellar area. Her range of motion is normal her knee remains stable the patella is stable as well. Muscle tone is normal skin is intact she has good distal pulse.  Straight leg raise is negative for radicular symptoms  Recommend MRI right knee. I am suspecting we will have to do a chondroplasty on this knee to get pain relief.

## 2013-12-11 NOTE — Telephone Encounter (Signed)
Discussed with patient. Script ready for pickup. Pt transferred to front to schedule office visit.

## 2013-12-22 ENCOUNTER — Ambulatory Visit (HOSPITAL_COMMUNITY): Payer: Managed Care, Other (non HMO)

## 2013-12-23 ENCOUNTER — Ambulatory Visit: Payer: Managed Care, Other (non HMO) | Admitting: Orthopedic Surgery

## 2013-12-29 ENCOUNTER — Encounter: Payer: Self-pay | Admitting: Orthopedic Surgery

## 2014-01-01 ENCOUNTER — Encounter: Payer: Self-pay | Admitting: Nurse Practitioner

## 2014-01-01 ENCOUNTER — Ambulatory Visit (INDEPENDENT_AMBULATORY_CARE_PROVIDER_SITE_OTHER): Payer: Managed Care, Other (non HMO) | Admitting: Nurse Practitioner

## 2014-01-01 VITALS — BP 130/82 | Ht 65.0 in | Wt 183.8 lb

## 2014-01-01 DIAGNOSIS — M1990 Unspecified osteoarthritis, unspecified site: Secondary | ICD-10-CM

## 2014-01-01 DIAGNOSIS — M545 Low back pain, unspecified: Secondary | ICD-10-CM

## 2014-01-01 MED ORDER — CHLORZOXAZONE 500 MG PO TABS: 500.0000 mg | ORAL_TABLET | Freq: Three times a day (TID) | ORAL | Status: DC | PRN

## 2014-01-01 MED ORDER — HYDROCODONE-ACETAMINOPHEN 10-325 MG PO TABS: ORAL_TABLET | ORAL | Status: DC

## 2014-01-04 ENCOUNTER — Encounter: Payer: Self-pay | Admitting: Nurse Practitioner

## 2014-01-04 NOTE — Progress Notes (Signed)
Subjective:  Presents for recheck of back pain and for pain management. Just increased her Neurontin up to BID. Has significant bilat knee pain. Being followed by orthopedics for this. Continues to work long hours. Very active job. Pain med controls pain and allows her to function.  Objective:   BP 130/82 mmHg  Ht 5\' 5"  (1.651 m)  Wt 183 lb 12.8 oz (83.371 kg)  BMI 30.59 kg/m2 NAD. Alert, oriented. Lungs clear. Heart RRR. Tenderness with palpation of lumbar paraspinal area. Tight muscles noted especially on the right.   Assessment:  Problem List Items Addressed This Visit      Musculoskeletal and Integument   Arthritis   Relevant Medications      HYDROcodone-acetaminophen (NORCO) 10-325 MG per tablet      chlorzoxazone (PARAFON) 500 MG tablet     Other   Low back pain - Primary   Relevant Medications      HYDROcodone-acetaminophen (NORCO) 10-325 MG per tablet      chlorzoxazone (PARAFON) 500 MG tablet     Plan:  Meds ordered this encounter  Medications  . DISCONTD: HYDROcodone-acetaminophen (NORCO) 10-325 MG per tablet    Sig: 1/2-1 po BID prn pain    Dispense:  45 tablet    Refill:  0    Order Specific Question:  Supervising Provider    Answer:  Merlyn AlbertLUKING, WILLIAM S [[id number]]  . DISCONTD: HYDROcodone-acetaminophen (NORCO) 10-325 MG per tablet    Sig: 1/2-1 po BID prn pain    Dispense:  45 tablet    Refill:  0    May fill 30 days from 01/01/14    Order Specific Question:  Supervising Provider    Answer:  Merlyn AlbertLUKING, WILLIAM S [[id number]]  . HYDROcodone-acetaminophen (NORCO) 10-325 MG per tablet    Sig: 1/2-1 po BID prn pain    Dispense:  45 tablet    Refill:  0    May fill 60 days from 01/01/14    Order Specific Question:  Supervising Provider    Answer:  Merlyn AlbertLUKING, WILLIAM S [[id number]]  . chlorzoxazone (PARAFON) 500 MG tablet    Sig: Take 1 tablet (500 mg total) by mouth 3 (three) times daily as needed for muscle spasms.    Dispense:  30 tablet    Refill:  0    Caution regarding  drowsiness    Order Specific Question:  Supervising Provider    Answer:  Merlyn AlbertLUKING, WILLIAM S [[id number]]   Ice/heat to back area. Stretching exercises. Continue to work on weight loss. Return in about 3 months (around 04/03/2014).

## 2014-01-26 ENCOUNTER — Telehealth: Payer: Self-pay | Admitting: Nurse Practitioner

## 2014-01-26 NOTE — Telephone Encounter (Signed)
error 

## 2014-01-27 ENCOUNTER — Encounter: Payer: Self-pay | Admitting: Nurse Practitioner

## 2014-02-25 ENCOUNTER — Other Ambulatory Visit: Payer: Self-pay | Admitting: Family Medicine

## 2014-02-26 ENCOUNTER — Other Ambulatory Visit: Payer: Self-pay | Admitting: Family Medicine

## 2014-03-02 NOTE — Telephone Encounter (Signed)
May refill this +2 additional refills 

## 2014-03-23 ENCOUNTER — Other Ambulatory Visit: Payer: Self-pay

## 2014-04-01 ENCOUNTER — Encounter: Payer: Self-pay | Admitting: Nurse Practitioner

## 2014-04-01 ENCOUNTER — Ambulatory Visit (INDEPENDENT_AMBULATORY_CARE_PROVIDER_SITE_OTHER): Payer: Managed Care, Other (non HMO) | Admitting: Nurse Practitioner

## 2014-04-01 ENCOUNTER — Encounter (HOSPITAL_COMMUNITY): Payer: Self-pay

## 2014-04-01 ENCOUNTER — Emergency Department (HOSPITAL_COMMUNITY)
Admission: EM | Admit: 2014-04-01 | Discharge: 2014-04-01 | Disposition: A | Discharge status: Home / Self Care | Payer: Managed Care, Other (non HMO) | Attending: Emergency Medicine | Admitting: Emergency Medicine

## 2014-04-01 ENCOUNTER — Emergency Department (HOSPITAL_COMMUNITY): Payer: Managed Care, Other (non HMO)

## 2014-04-01 VITALS — BP 138/90 | Ht 65.0 in | Wt 184.0 lb

## 2014-04-01 DIAGNOSIS — G8929 Other chronic pain: Secondary | ICD-10-CM | POA: Diagnosis not present

## 2014-04-01 DIAGNOSIS — K219 Gastro-esophageal reflux disease without esophagitis: Secondary | ICD-10-CM | POA: Insufficient documentation

## 2014-04-01 DIAGNOSIS — Z880 Allergy status to penicillin: Secondary | ICD-10-CM | POA: Insufficient documentation

## 2014-04-01 DIAGNOSIS — Z79899 Other long term (current) drug therapy: Secondary | ICD-10-CM | POA: Insufficient documentation

## 2014-04-01 DIAGNOSIS — Z8742 Personal history of other diseases of the female genital tract: Secondary | ICD-10-CM | POA: Insufficient documentation

## 2014-04-01 DIAGNOSIS — M545 Low back pain, unspecified: Secondary | ICD-10-CM

## 2014-04-01 DIAGNOSIS — Z3202 Encounter for pregnancy test, result negative: Secondary | ICD-10-CM | POA: Insufficient documentation

## 2014-04-01 DIAGNOSIS — Z720 Tobacco use: Secondary | ICD-10-CM | POA: Insufficient documentation

## 2014-04-01 DIAGNOSIS — M1990 Unspecified osteoarthritis, unspecified site: Secondary | ICD-10-CM | POA: Diagnosis not present

## 2014-04-01 DIAGNOSIS — M5442 Lumbago with sciatica, left side: Secondary | ICD-10-CM

## 2014-04-01 DIAGNOSIS — R52 Pain, unspecified: Secondary | ICD-10-CM

## 2014-04-01 LAB — COMPREHENSIVE METABOLIC PANEL
ALT: 14 U/L (ref 0–35)
AST: 19 U/L (ref 0–37)
Albumin: 4.3 g/dL (ref 3.5–5.2)
Alkaline Phosphatase: 64 U/L (ref 39–117)
Anion gap: 6 (ref 5–15)
BUN: 18 mg/dL (ref 6–23)
CO2: 25 mmol/L (ref 19–32)
Calcium: 9.3 mg/dL (ref 8.4–10.5)
Chloride: 110 mmol/L (ref 96–112)
Creatinine, Ser: 0.77 mg/dL (ref 0.50–1.10)
GFR calc Af Amer: >90 mL/min (ref >90)
GFR calc non Af Amer: >90 mL/min (ref >90)
Glucose, Bld: 91 mg/dL (ref 70–99)
Potassium: 3.9 mmol/L (ref 3.5–5.1)
Sodium: 141 mmol/L (ref 135–145)
Total Bilirubin: 0.5 mg/dL (ref 0.3–1.2)
Total Protein: 7.3 g/dL (ref 6.0–8.3)

## 2014-04-01 LAB — CBC WITH DIFFERENTIAL/PLATELET
Basophils Absolute: 0 10*3/uL (ref 0.0–0.1)
Basophils Relative: 0 % (ref 0–1)
Eosinophils Absolute: 0.1 10*3/uL (ref 0.0–0.7)
Eosinophils Relative: 1 % (ref 0–5)
HCT: 40.5 % (ref 36.0–46.0)
Hemoglobin: 13.4 g/dL (ref 12.0–15.0)
Lymphocytes Relative: 27 % (ref 12–46)
Lymphs Abs: 2.4 10*3/uL (ref 0.7–4.0)
MCH: 29 pg (ref 26.0–34.0)
MCHC: 33.1 g/dL (ref 30.0–36.0)
MCV: 87.7 fL (ref 78.0–100.0)
Monocytes Absolute: 0.8 10*3/uL (ref 0.1–1.0)
Monocytes Relative: 9 % (ref 3–12)
Neutro Abs: 5.6 10*3/uL (ref 1.7–7.7)
Neutrophils Relative %: 63 % (ref 43–77)
Platelets: 170 10*3/uL (ref 150–400)
RBC: 4.62 MIL/uL (ref 3.87–5.11)
RDW: 13.3 % (ref 11.5–15.5)
WBC: 8.8 10*3/uL (ref 4.0–10.5)

## 2014-04-01 LAB — URINALYSIS, ROUTINE W REFLEX MICROSCOPIC
Glucose, UA: NEGATIVE mg/dL
Hgb urine dipstick: NEGATIVE
Ketones, ur: NEGATIVE mg/dL
Leukocytes, UA: NEGATIVE
Nitrite: NEGATIVE
Protein, ur: NEGATIVE mg/dL
Specific Gravity, Urine: >1.03 — ABNORMAL HIGH (ref 1.005–1.030)
Urobilinogen, UA: 0.2 mg/dL (ref 0.0–1.0)
pH: 5.5 (ref 5.0–8.0)

## 2014-04-01 LAB — POC URINE PREG, ED: Preg Test, Ur: NEGATIVE

## 2014-04-01 MED ORDER — ONDANSETRON HCL 4 MG/2ML IJ SOLN
4.0000 mg | Freq: Once | INTRAMUSCULAR | Status: AC
  Administered 2014-04-01: 4 mg via INTRAVENOUS
  Filled 2014-04-01: qty 2

## 2014-04-01 MED ORDER — HYDROCODONE-ACETAMINOPHEN 10-325 MG PO TABS: ORAL_TABLET | ORAL | Status: DC

## 2014-04-01 MED ORDER — GABAPENTIN 300 MG PO CAPS: 300.0000 mg | ORAL_CAPSULE | Freq: Three times a day (TID) | ORAL | Status: DC

## 2014-04-01 MED ORDER — HYDROMORPHONE HCL 1 MG/ML IJ SOLN
1.0000 mg | Freq: Once | INTRAMUSCULAR | Status: AC
  Administered 2014-04-01: 1 mg via INTRAVENOUS
  Filled 2014-04-01: qty 1

## 2014-04-01 MED ORDER — KETOROLAC TROMETHAMINE 30 MG/ML IJ SOLN
30.0000 mg | Freq: Once | INTRAMUSCULAR | Status: AC
  Administered 2014-04-01: 30 mg via INTRAVENOUS
  Filled 2014-04-01: qty 1

## 2014-04-01 MED ORDER — METHYLPREDNISOLONE SODIUM SUCC 125 MG IJ SOLR
125.0000 mg | Freq: Once | INTRAMUSCULAR | Status: AC
  Administered 2014-04-01: 125 mg via INTRAVENOUS
  Filled 2014-04-01: qty 2

## 2014-04-01 MED ORDER — PREDNISONE 20 MG PO TABS: ORAL_TABLET | ORAL | Status: DC

## 2014-04-01 MED ORDER — CHLORZOXAZONE 500 MG PO TABS: 500.0000 mg | ORAL_TABLET | Freq: Three times a day (TID) | ORAL | Status: DC | PRN

## 2014-04-01 NOTE — ED Notes (Signed)
Sudden sharp pain in lower back while bending over. Pain now radiating down left leg

## 2014-04-01 NOTE — ED Provider Notes (Signed)
CSN: [id number]     Arrival date & time 04/01/14  0119 History   First MD Initiated Contact with Patient 04/01/14 0208     Chief Complaint  Patient presents with  . Back Pain     (Consider location/radiation/quality/duration/timing/severity/associated sxs/prior Treatment) Patient is a 44 y.o. female presenting with back pain. The history is provided by the patient (the pt states she was bending over and had bad left flank back pain).  Back Pain Location:  Lumbar spine Quality:  Aching Radiates to:  L thigh Pain severity:  Moderate Pain is:  Worse during the day Onset quality:  Sudden Timing:  Constant Progression:  Waxing and waning Chronicity:  New Context: not emotional stress   Associated symptoms: no abdominal pain, no chest pain and no headaches     Past Medical History  Diagnosis Date  . Chronic knee pain   . Chronic back pain   . Menorrhagia 09/09/2012  . Arthritis     Knee  . Family history of anesthesia complication     Brother and Mother woke up during surgery; Heard and felt could not speak  . GERD (gastroesophageal reflux disease)    Past Surgical History  Procedure Laterality Date  . Cesarean section      X2   . Esophagogastroduodenoscopy N/A 12/27/2012    Procedure: ESOPHAGOGASTRODUODENOSCOPY (EGD);  Surgeon: West Bali, MD;  Location: [facility];  Service: Endoscopy;  Laterality: N/A;  2:30-moved to 13:45 Soledad Gerlach notified pt  . Tubal ligation      along with c-section  . Cholecystectomy N/A 01/17/2013    Procedure: LAPAROSCOPIC CHOLECYSTECTOMY;  Surgeon: Marlane Hatcher, MD;  Location: [facility];  Service: General;  Laterality: N/A;  . Dilitation & currettage/hystroscopy with thermachoice ablation N/A 02/25/2013    Procedure: DILATATION & CURETTAGE/HYSTEROSCOPY WITH THERMACHOICE ABLATION;  Surgeon: [name], MD;  Location: [facility];  Service: Gynecology;  Laterality: N/A;  Total Therapy Time:8:54 minutes Temperature:87 degrees   Family  History  Problem Relation Age of Onset  . Hypertension Mother   . Diabetes Mother   . Thyroid disease Mother   . COPD Mother   . Arthritis Mother   . Depression Mother   . Mental illness Mother   . Stroke Mother   . Prostate cancer Father     metastasized to colon  . Diabetes Maternal Grandmother   . Diabetes Paternal Grandmother   . Cancer Sister   . Colon cancer Brother     died at age 76  . Heart attack Brother   . Diabetes Sister   . Depression Sister   . Mental illness Sister    History  Substance Use Topics  . Smoking status: Current Every Day Smoker -- 1.00 packs/day for 24 years    Types: Cigarettes  . Smokeless tobacco: Never Used  . Alcohol Use: Yes     Comment: socially    OB History    Gravida Para Term Preterm AB TAB SAB Ectopic Multiple Living   Review of Systems  Constitutional: Negative for appetite change and fatigue.  HENT: Negative for congestion, ear discharge and sinus pressure.   Eyes: Negative for discharge.  Respiratory: Negative for cough.   Cardiovascular: Negative for chest pain.  Gastrointestinal: Negative for abdominal pain and diarrhea.  Genitourinary: Negative for frequency and hematuria.  Musculoskeletal: Positive for back pain.  Skin: Negative for rash.  Neurological: Negative  for seizures and headaches.  Psychiatric/Behavioral: Negative for hallucinations.      Allergies  Penicillins; Sulfa antibiotics; and Xanax  Home Medications   Prior to Admission medications   Medication Sig Start Date End Date Taking? Authorizing Provider  Calcium Carbonate Antacid (TUMS PO) Take by mouth.    Historical Provider, MD  chlorzoxazone (PARAFON) 500 MG tablet Take 1 tablet (500 mg total) by mouth 3 (three) times daily as needed for muscle spasms. 01/01/14   Campbell Richesarolyn C Hoskins, NP  colestipol (COLESTID) 1 G tablet Take 2 tablets (2 g total) by mouth 2 (two) times daily. 09/08/13   Campbell Richesarolyn C Hoskins, NP  DEXILANT 60 MG  capsule TAKE ONE CAPSULE BY MOUTH DAILY. 02/25/14   Babs SciaraScott A Luking, MD  DONNATAL 16.2 MG/5ML ELIX TAKE 50ML BY MOUTH THREE TIMES DAILY AS NEEDED FOR REFLUX. 03/03/14   Babs SciaraScott A Luking, MD  gabapentin (NEURONTIN) 300 MG capsule Take 1 capsule (300 mg total) by mouth 3 (three) times daily. 09/29/13   Campbell Richesarolyn C Hoskins, NP  HYDROcodone-acetaminophen [facility](NORCO) 10-325 MG per tablet 1/2-1 po BID prn pain 01/01/14   Campbell Richesarolyn C Hoskins, NP  Ibuprofen (ADVIL PO) Take by mouth.    Historical Provider, MD  phentermine (ADIPEX-P) 37.5 MG tablet Take 1 tablet (37.5 mg total) by mouth daily before breakfast. 06/05/13   Babs SciaraScott A Luking, MD  predniSONE (DELTASONE) 20 MG tablet 2 tabs po daily x 3 days 04/01/14   Benny LennertJoseph L Pranav Lince, MD   BP 148/89 mmHg  Pulse 74  Temp(Src) 97.5 F (36.4 C) (Oral)  Resp 20  Ht 5\' 5"  (1.651 m)  Wt 185 lb (83.915 kg)  BMI 30.79 kg/m2  SpO2 100%  LMP 03/29/2014 Physical Exam  Constitutional: She is oriented to person, place, and time. She appears well-developed.  HENT:  Head: Normocephalic.  Eyes: Conjunctivae and EOM are normal. No scleral icterus.  Neck: Neck supple. No thyromegaly present.  Cardiovascular: Normal rate and regular rhythm.  Exam reveals no gallop and no friction rub.   No murmur heard. Pulmonary/Chest: No stridor. She has no wheezes. She has no rales. She exhibits no tenderness.  Abdominal: She exhibits no distension. There is no tenderness. There is no rebound.  Musculoskeletal: Normal range of motion. She exhibits no edema.  Tender left flank  Lymphadenopathy:    She has no cervical adenopathy.  Neurological: She is oriented to person, place, and time. She displays normal reflexes. She exhibits normal muscle tone. Coordination normal.  Pos straight leg raise left  Skin: No rash noted. No erythema.  Psychiatric: She has a normal mood and affect. Her behavior is normal.    ED Course  Procedures (including critical care time) Labs Review Labs Reviewed   URINALYSIS, ROUTINE W REFLEX MICROSCOPIC - Abnormal; Notable for the following:    Color, Urine AMBER (*)    Specific Gravity, Urine >1.030 (*)    Bilirubin Urine SMALL (*)    All other components within normal limits  CBC WITH DIFFERENTIAL/PLATELET  COMPREHENSIVE METABOLIC PANEL  POC URINE PREG, ED    Imaging Review Ct Renal Stone Study  04/01/2014   CLINICAL DATA:  Initial evaluation for sudden onset left-sided flank pain.  EXAM: CT ABDOMEN AND PELVIS WITHOUT CONTRAST  TECHNIQUE: Multidetector CT imaging of the abdomen and pelvis was performed following the standard protocol without IV contrast.  COMPARISON:  Prior CT from 01/12/2013  FINDINGS: Mild atelectatic changes seen dependently within the visualized left lung base. Visualized lungs are otherwise clear.  No pleural or pericardial effusion.  Limited noncontrast evaluation of the liver is unremarkable. Gallbladder is absent. No biliary dilatation. Spleen is normal. Adrenal glands and pancreas within normal limits.  Punctate nonobstructive stone present within the upper pole of the right kidney. No obstructive calculi seen along the course of the right renal collecting system. There is no right-sided hydronephrosis or hydroureter.  On the left, no left renal nephrolithiasis is identified. No obstructive stone seen along the course of the left renal collecting system. There is no left-sided hydronephrosis or hydroureter.  Stomach within normal limits. No evidence for bowel obstruction. No abnormal wall thickening or inflammatory changes seen about the bowels. No evidence for acute appendicitis.  Bladder is largely decompressed and not well evaluated. Uterus and ovaries within normal limits.  No free air or fluid.  No adenopathy.  No acute osseous abnormality. No worrisome lytic or blastic osseous lesions. Degenerative disc desiccation present at L5-S1 with associated disc filled and discal calcification.  IMPRESSION: 1. No left-sided nephrolithiasis  or obstructive uropathy to explain left-sided flank pain. 2. Punctate nonobstructive stone in the upper pole of the right kidney. 3. No other acute intra-abdominal or pelvic process. 4. Status post cholecystectomy.   Electronically Signed   By: Rise Mu M.D.   On: 04/01/2014 03:26     EKG Interpretation None      MDM   Final diagnoses:  Pain  Back pain at L4-L5 level    Sciatic,  tx with prednisone and pain meds,  Rest and follow up    Benny Lennert, MD 04/01/14 [phone]

## 2014-04-01 NOTE — Progress Notes (Signed)
Subjective:  Presents for routine follow-up. Reflux stable. During work last night began having significant back pain radiating down her left leg. Will be seeing her occupational health provider as soon as she leaves this office visit. Has a history of low back pain but she states this is different. Pain is radiating down the left leg and describes as severe. Patient understands that we cannot see her for her acute back pain since this occurred at work. Will address her chronic issues. Went to local ED where she received IV pain medication steroid and Toradol with minimal improvement of her pain.  Objective:   BP 138/90 mmHg  Ht 5\' 5"  (1.651 m)  Wt 184 lb (83.462 kg)  BMI 30.62 kg/m2  LMP 03/29/2014 NAD. Alert, oriented. Patient changing positions frequently due to back pain. Lungs clear. Heart regular rate rhythm. Abdomen soft nontender.  Assessment:  Problem List Items Addressed This Visit      Digestive   GERD (gastroesophageal reflux disease) - Primary   Relevant Medications   DONNATAL 16.2 MG/5ML ELIX     Other   Low back pain   Relevant Medications   chlorzoxazone (PARAFON) 500 MG tablet   HYDROcodone-acetaminophen (NORCO) 10-325 MG per tablet     Plan:  Meds ordered this encounter  Medications  . chlorzoxazone (PARAFON) 500 MG tablet    Sig: Take 1 tablet (500 mg total) by mouth 3 (three) times daily as needed for muscle spasms.    Dispense:  30 tablet    Refill:  0    Caution regarding drowsiness    Order Specific Question:  Supervising Provider    Answer:  Merlyn AlbertLUKING, WILLIAM S [[id number]]  . gabapentin (NEURONTIN) 300 MG capsule    Sig: Take 1 capsule (300 mg total) by mouth 3 (three) times daily.    Dispense:  90 capsule    Refill:  5    Order Specific Question:  Supervising Provider    Answer:  Merlyn AlbertLUKING, WILLIAM S [[id number]]  . DISCONTD: HYDROcodone-acetaminophen (NORCO) 10-325 MG per tablet    Sig: 1/2-1 po BID prn pain    Dispense:  45 tablet    Refill:  0    May fill 60  days from 04/01/14    Order Specific Question:  Supervising Provider    Answer:  Merlyn AlbertLUKING, WILLIAM S [[id number]]  . DISCONTD: HYDROcodone-acetaminophen (NORCO) 10-325 MG per tablet    Sig: 1/2-1 po BID prn pain    Dispense:  45 tablet    Refill:  0    May fill 30 days from 04/01/14    Order Specific Question:  Supervising Provider    Answer:  Merlyn AlbertLUKING, WILLIAM S [[id number]]  . HYDROcodone-acetaminophen (NORCO) 10-325 MG per tablet    Sig: 1/2-1 po BID prn pain    Dispense:  45 tablet    Refill:  0    Order Specific Question:  Supervising Provider    Answer:  Merlyn AlbertLUKING, WILLIAM S [[id number]]  . DONNATAL 16.2 MG/5ML ELIX    Sig:    Given 3 monthly prescriptions for her pain medicine. Also refill on her muscle relaxant. May need to increase her gabapentin dose depending on current back pain. Follow-up with occupational health as recommended. Recommend that she discuss use of steroid Dosepak for her pain. Return in about 3 months (around 06/30/2014). Call back sooner if needed.

## 2014-04-01 NOTE — Discharge Instructions (Signed)
Follow up with your md today as planned

## 2014-04-02 ENCOUNTER — Other Ambulatory Visit: Payer: Managed Care, Other (non HMO) | Admitting: Adult Health

## 2014-06-01 ENCOUNTER — Telehealth: Payer: Self-pay | Admitting: Family Medicine

## 2014-06-01 MED ORDER — COLESTIPOL HCL 1 G PO TABS: 2.0000 g | ORAL_TABLET | Freq: Two times a day (BID) | ORAL | Status: DC

## 2014-06-01 NOTE — Telephone Encounter (Signed)
Patient working toward prescribed dose of 2 tabs twice a day. Rx sent electronically to pharmacy. Patient notified.

## 2014-06-01 NOTE — Telephone Encounter (Signed)
Clarify how pt taking and refill medicine with 30 day supply and 5 refills, if ongoing issues I rec follow up with Eber Jonesarolyn or I

## 2014-06-01 NOTE — Telephone Encounter (Signed)
colestipol (COLESTID) 1 G tablet  Pt states her stomach is still having issues, switched to the whole pill  Instead of the half an its a little better but she is now out of this med.   Please call when ready

## 2014-06-16 ENCOUNTER — Ambulatory Visit (INDEPENDENT_AMBULATORY_CARE_PROVIDER_SITE_OTHER): Payer: Managed Care, Other (non HMO) | Admitting: Nurse Practitioner

## 2014-06-16 ENCOUNTER — Encounter: Payer: Self-pay | Admitting: Nurse Practitioner

## 2014-06-16 VITALS — BP 116/70 | Ht 65.0 in | Wt 183.0 lb

## 2014-06-16 DIAGNOSIS — G8929 Other chronic pain: Secondary | ICD-10-CM | POA: Diagnosis not present

## 2014-06-16 DIAGNOSIS — M170 Bilateral primary osteoarthritis of knee: Secondary | ICD-10-CM

## 2014-06-16 DIAGNOSIS — M129 Arthropathy, unspecified: Secondary | ICD-10-CM

## 2014-06-16 DIAGNOSIS — K219 Gastro-esophageal reflux disease without esophagitis: Secondary | ICD-10-CM | POA: Diagnosis not present

## 2014-06-16 DIAGNOSIS — M549 Dorsalgia, unspecified: Secondary | ICD-10-CM | POA: Diagnosis not present

## 2014-06-16 DIAGNOSIS — R197 Diarrhea, unspecified: Secondary | ICD-10-CM

## 2014-06-16 MED ORDER — HYDROCODONE-ACETAMINOPHEN 10-325 MG PO TABS: ORAL_TABLET | ORAL | Status: DC

## 2014-06-16 NOTE — Patient Instructions (Signed)
activia yogurt 2 cups per day Align as directed 

## 2014-06-17 ENCOUNTER — Encounter: Payer: Self-pay | Admitting: Nurse Practitioner

## 2014-06-17 NOTE — Progress Notes (Signed)
Subjective:  Presents for recheck. Has chronic daily pain, worse some days than others. Pain med allows her to continue working. Has been told by orthopedic specialist that she needs bilat knee replacements. Also degenerative changes in the spine. Has off/on diarrhea worse with certain foods and stress. No blood in her stool. No family history of colon cancer. Still taking colestid for diarrhea after cholecystectomy. Is off her Dexilant this month because of cost. Has switched to Zantac for now which is working well.   Objective:   BP 116/70 mmHg  Ht 5\' 5"  (1.651 m)  Wt 183 lb (83.008 kg)  BMI 30.45 kg/m2  LMP 05/16/2014 NAD. Alert, oriented. Lungs clear. Heart RRR. Abdomen soft, non distended, non tender.   Assessment:  Problem List Items Addressed This Visit      Digestive   GERD (gastroesophageal reflux disease)     Musculoskeletal and Integument   Arthritis of both knees (Chronic)   Relevant Medications   HYDROcodone-acetaminophen (NORCO) 10-325 MG per tablet     Other   Chronic back pain - Primary   Relevant Medications   HYDROcodone-acetaminophen (NORCO) 10-325 MG per tablet    Other Visit Diagnoses    Diarrhea/irritable bowel syndrome          Plan:  Meds ordered this encounter  Medications  . DISCONTD: HYDROcodone-acetaminophen (NORCO) 10-325 MG per tablet    Sig: 1/2-1 po BID prn pain    Dispense:  45 tablet    Refill:  0    Order Specific Question:  Supervising Provider    Answer:  Merlyn AlbertLUKING, WILLIAM S [[id number]]  . DISCONTD: HYDROcodone-acetaminophen (NORCO) 10-325 MG per tablet    Sig: 1/2-1 po BID prn pain    Dispense:  45 tablet    Refill:  0    May fill 30 days from 06/16/14    Order Specific Question:  Supervising Provider    Answer:  Merlyn AlbertLUKING, WILLIAM S [[id number]]  . HYDROcodone-acetaminophen (NORCO) 10-325 MG per tablet    Sig: 1/2-1 po BID prn pain    Dispense:  45 tablet    Refill:  0    May fill 60 days from 06/16/14    Order Specific Question:  Supervising  Provider    Answer:  Merlyn AlbertLUKING, WILLIAM S [[id number]]   Start bowel probiotics. Feel that diarrhea is a combination of post cholecystectomy and IBS. Recommend preventive health physical.  Return in about 3 months (around 09/15/2014) for recheck.

## 2014-06-20 ENCOUNTER — Other Ambulatory Visit (HOSPITAL_COMMUNITY): Payer: Self-pay | Admitting: Emergency Medicine

## 2014-06-20 ENCOUNTER — Inpatient Hospital Stay (HOSPITAL_COMMUNITY): Admit: 2014-06-20 | Payer: Managed Care, Other (non HMO)

## 2014-06-20 ENCOUNTER — Other Ambulatory Visit (HOSPITAL_COMMUNITY): Payer: Managed Care, Other (non HMO)

## 2014-06-20 ENCOUNTER — Emergency Department (HOSPITAL_COMMUNITY)
Admission: EM | Admit: 2014-06-20 | Discharge: 2014-06-20 | Disposition: A | Discharge status: Home / Self Care | Payer: Managed Care, Other (non HMO) | Attending: Emergency Medicine | Admitting: Emergency Medicine

## 2014-06-20 ENCOUNTER — Encounter (HOSPITAL_COMMUNITY): Payer: Self-pay

## 2014-06-20 DIAGNOSIS — M79604 Pain in right leg: Secondary | ICD-10-CM

## 2014-06-20 DIAGNOSIS — M79605 Pain in left leg: Principal | ICD-10-CM

## 2014-06-20 DIAGNOSIS — M1990 Unspecified osteoarthritis, unspecified site: Secondary | ICD-10-CM | POA: Diagnosis not present

## 2014-06-20 DIAGNOSIS — Z8719 Personal history of other diseases of the digestive system: Secondary | ICD-10-CM | POA: Insufficient documentation

## 2014-06-20 DIAGNOSIS — G8929 Other chronic pain: Secondary | ICD-10-CM | POA: Insufficient documentation

## 2014-06-20 DIAGNOSIS — Z79899 Other long term (current) drug therapy: Secondary | ICD-10-CM | POA: Insufficient documentation

## 2014-06-20 DIAGNOSIS — Z8742 Personal history of other diseases of the female genital tract: Secondary | ICD-10-CM | POA: Insufficient documentation

## 2014-06-20 DIAGNOSIS — Z720 Tobacco use: Secondary | ICD-10-CM | POA: Diagnosis not present

## 2014-06-20 DIAGNOSIS — R2243 Localized swelling, mass and lump, lower limb, bilateral: Secondary | ICD-10-CM | POA: Diagnosis present

## 2014-06-20 DIAGNOSIS — R600 Localized edema: Secondary | ICD-10-CM | POA: Insufficient documentation

## 2014-06-20 DIAGNOSIS — R609 Edema, unspecified: Secondary | ICD-10-CM

## 2014-06-20 LAB — CBC WITH DIFFERENTIAL/PLATELET
Basophils Absolute: 0 10*3/uL (ref 0.0–0.1)
Basophils Relative: 0 % (ref 0–1)
Eosinophils Absolute: 0.1 10*3/uL (ref 0.0–0.7)
Eosinophils Relative: 2 % (ref 0–5)
HCT: 38.9 % (ref 36.0–46.0)
Hemoglobin: 12.9 g/dL (ref 12.0–15.0)
Lymphocytes Relative: 27 % (ref 12–46)
Lymphs Abs: 2.1 10*3/uL (ref 0.7–4.0)
MCH: 29.5 pg (ref 26.0–34.0)
MCHC: 33.2 g/dL (ref 30.0–36.0)
MCV: 88.8 fL (ref 78.0–100.0)
Monocytes Absolute: 0.7 10*3/uL (ref 0.1–1.0)
Monocytes Relative: 9 % (ref 3–12)
Neutro Abs: 5 10*3/uL (ref 1.7–7.7)
Neutrophils Relative %: 62 % (ref 43–77)
Platelets: 136 10*3/uL — ABNORMAL LOW (ref 150–400)
RBC: 4.38 MIL/uL (ref 3.87–5.11)
RDW: 14 % (ref 11.5–15.5)
WBC: 8 10*3/uL (ref 4.0–10.5)

## 2014-06-20 LAB — BASIC METABOLIC PANEL
Anion gap: 7 (ref 5–15)
BUN: 18 mg/dL (ref 6–23)
CO2: 25 mmol/L (ref 19–32)
Calcium: 8.9 mg/dL (ref 8.4–10.5)
Chloride: 109 mmol/L (ref 96–112)
Creatinine, Ser: 0.57 mg/dL (ref 0.50–1.10)
GFR calc Af Amer: >90 mL/min (ref >90)
GFR calc non Af Amer: >90 mL/min (ref >90)
Glucose, Bld: 98 mg/dL (ref 70–99)
Potassium: 4.2 mmol/L (ref 3.5–5.1)
Sodium: 141 mmol/L (ref 135–145)

## 2014-06-20 LAB — BRAIN NATRIURETIC PEPTIDE: B Natriuretic Peptide: 9 pg/mL (ref 0.0–100.0)

## 2014-06-20 NOTE — ED Notes (Signed)
Patient verbalizes understanding of discharge instructions, home care and follow up care. Patient ambulatory out of the department at this time.

## 2014-06-20 NOTE — ED Provider Notes (Signed)
CSN: [id number]     Arrival date & time 06/20/14  0215 History   First MD Initiated Contact with Patient 06/20/14 0221     Chief Complaint  Patient presents with  . Leg Swelling     (Consider location/radiation/quality/duration/timing/severity/associated sxs/prior Treatment) HPI Patient presents with increased bilateral lower extremity swelling right greater than left. This is worse over the past day. She states she's had intermittent swelling especially in the right leg chronically. She denies any trauma. She states she's had increased pain to the right calf especially with dorsiflexing the right foot. She denies any extended travel or surgeries. No fever or chills. No chest pain or difficulty breathing. Past Medical History  Diagnosis Date  . Chronic knee pain   . Chronic back pain   . Menorrhagia 09/09/2012  . Arthritis     Knee  . Family history of anesthesia complication     Brother and Mother woke up during surgery; Heard and felt could not speak  . GERD (gastroesophageal reflux disease)    Past Surgical History  Procedure Laterality Date  . Cesarean section      X2   . Esophagogastroduodenoscopy N/A 12/27/2012    Procedure: ESOPHAGOGASTRODUODENOSCOPY (EGD);  Surgeon: West Bali, MD;  Location: [facility];  Service: Endoscopy;  Laterality: N/A;  2:30-moved to 13:45 Soledad Gerlach notified pt  . Tubal ligation      along with c-section  . Cholecystectomy N/A 01/17/2013    Procedure: LAPAROSCOPIC CHOLECYSTECTOMY;  Surgeon: Marlane Hatcher, MD;  Location: [facility];  Service: General;  Laterality: N/A;  . Dilitation & currettage/hystroscopy with thermachoice ablation N/A 02/25/2013    Procedure: DILATATION & CURETTAGE/HYSTEROSCOPY WITH THERMACHOICE ABLATION;  Surgeon: [name], MD;  Location: [facility];  Service: Gynecology;  Laterality: N/A;  Total Therapy Time:8:54 minutes Temperature:87 degrees   Family History  Problem Relation Age of Onset  . Hypertension Mother    . Diabetes Mother   . Thyroid disease Mother   . COPD Mother   . Arthritis Mother   . Depression Mother   . Mental illness Mother   . Stroke Mother   . Prostate cancer Father     metastasized to colon  . Diabetes Maternal Grandmother   . Diabetes Paternal Grandmother   . Cancer Sister   . Colon cancer Brother     died at age 31  . Heart attack Brother   . Diabetes Sister   . Depression Sister   . Mental illness Sister    History  Substance Use Topics  . Smoking status: Current Every Day Smoker -- 1.00 packs/day for 24 years    Types: Cigarettes    Start date: 11/26/1983  . Smokeless tobacco: Never Used     Comment: quit 3 times for a total of 8 years since 1985  . Alcohol Use: Yes     Comment: socially just mixed drinks    OB History    Gravida Para Term Preterm AB TAB SAB Ectopic Multiple Living   Review of Systems  Constitutional: Negative for fever and chills.  Respiratory: Negative for shortness of breath.   Cardiovascular: Positive for leg swelling. Negative for chest pain.  Gastrointestinal: Negative for nausea, vomiting and abdominal pain.  Musculoskeletal: Negative for back pain, neck pain and neck stiffness.  Skin: Negative for rash and wound.  Neurological: Negative for dizziness, weakness, light-headedness and numbness.  All other  systems reviewed and are negative.     Allergies  Penicillins; Sulfa antibiotics; and Xanax  Home Medications   Prior to Admission medications   Medication Sig Start Date End Date Taking? Authorizing Provider  Calcium Carbonate Antacid (TUMS PO) Take by mouth.   Yes Historical Provider, MD  colestipol (COLESTID) 1 G tablet Take 2 tablets (2 g total) by mouth 2 (two) times daily. 06/01/14  Yes Campbell Richesarolyn C Hoskins, NP  DEXILANT 60 MG capsule TAKE ONE CAPSULE BY MOUTH DAILY. 02/25/14  Yes Babs SciaraScott A Luking, MD  gabapentin (NEURONTIN) 300 MG capsule Take 1 capsule (300 mg total) by mouth 3 (three) times daily.  04/01/14  Yes Campbell Richesarolyn C Hoskins, NP  HYDROcodone-acetaminophen [facility](NORCO) 10-325 MG per tablet 1/2-1 po BID prn pain 06/16/14  Yes Campbell Richesarolyn C Hoskins, NP  Ibuprofen (ADVIL PO) Take by mouth.   Yes Historical Provider, MD  chlorzoxazone (PARAFON) 500 MG tablet Take 1 tablet (500 mg total) by mouth 3 (three) times daily as needed for muscle spasms. Patient not taking: Reported on 06/16/2014 04/01/14   Campbell Richesarolyn C Hoskins, NP   BP 128/83 mmHg  Pulse 70  Temp(Src) 98.2 F (36.8 C) (Oral)  Resp 20  Ht 5\' 5"  (1.651 m)  Wt 183 lb (83.008 kg)  BMI 30.45 kg/m2  SpO2 99%  LMP 05/20/2014 Physical Exam  Constitutional: She is oriented to person, place, and time. She appears well-developed and well-nourished. No distress.  HENT:  Head: Normocephalic and atraumatic.  Mouth/Throat: Oropharynx is clear and moist.  Eyes: EOM are normal. Pupils are equal, round, and reactive to light.  Neck: Normal range of motion. Neck supple.  Cardiovascular: Normal rate and regular rhythm.   Pulmonary/Chest: Effort normal and breath sounds normal. No respiratory distress. She has no wheezes. She has no rales. She exhibits no tenderness.  Abdominal: Soft. Bowel sounds are normal. She exhibits no distension and no mass. There is no tenderness. There is no rebound and no guarding.  Musculoskeletal: Normal range of motion. She exhibits edema. She exhibits no tenderness.  2+ pitting edema to the right lower extremity especially around the foot and ankle. Patient has tenderness to palpation of the posterior calf. 1+ swelling to the left lower extremity. No obvious warmth or evidence of infection. 2+ dorsalis pedis pulses bilaterally.  Neurological: She is alert and oriented to person, place, and time.  5/5 motor in all extremities. Sensation is fully intact. Patient ambulating without difficulty.  Skin: Skin is warm and dry. No rash noted. No erythema.  Psychiatric: She has a normal mood and affect. Her behavior is normal.  Nursing  note and vitals reviewed.   ED Course  Procedures (including critical care time) Labs Review Labs Reviewed  CBC WITH DIFFERENTIAL/PLATELET - Abnormal; Notable for the following:    Platelets 136 (*)    All other components within normal limits  BRAIN NATRIURETIC PEPTIDE  BASIC METABOLIC PANEL    Imaging Review No results found.   EKG Interpretation None      MDM   Final diagnoses:  Peripheral edema    No evidence of CHF or renal failure. We'll arrange outpatient Dopplers of bilateral lower extremities. Patient's encouraged to keep legs elevated and wear compression hose. Return precautions given.    Loren Raceravid Darrielle Pflieger, MD 06/20/14 [phone]

## 2014-06-20 NOTE — ED Notes (Signed)
Patient states she went on a field trip this week, patient noticed bilateral leg swelling from thighs down with discomfort to lower legs. Patient denies SOB, CP or any other symptom.

## 2014-06-20 NOTE — Discharge Instructions (Signed)
Peripheral Edema °You have swelling in your legs (peripheral edema). This swelling is due to excess accumulation of salt and water in your body. Edema may be a sign of heart, kidney or liver disease, or a side effect of a medication. It may also be due to problems in the leg veins. Elevating your legs and using special support stockings may be very helpful, if the cause of the swelling is due to poor venous circulation. Avoid long periods of standing, whatever the cause. °Treatment of edema depends on identifying the cause. Chips, pretzels, pickles and other salty foods should be avoided. Restricting salt in your diet is almost always needed. Water pills (diuretics) are often used to remove the excess salt and water from your body via urine. These medicines prevent the kidney from reabsorbing sodium. This increases urine flow. °Diuretic treatment may also result in lowering of potassium levels in your body. Potassium supplements may be needed if you have to use diuretics daily. Daily weights can help you keep track of your progress in clearing your edema. You should call your caregiver for follow up care as recommended. °SEEK IMMEDIATE MEDICAL CARE IF:  °· You have increased swelling, pain, redness, or heat in your legs. °· You develop shortness of breath, especially when lying down. °· You develop chest or abdominal pain, weakness, or fainting. °· You have a fever. °Document Released: 03/23/2004 Document Revised: 05/08/2011 Document Reviewed: 03/03/2009 °[employer]® Patient Information ©2015 [employer]. This information is not intended to replace advice given to you by your health care provider. Make sure you discuss any questions you have with your health care provider. ° °

## 2014-06-20 NOTE — ED Notes (Signed)
Pt reports increased swelling to bilat knees and lower legs/ankles that have gotten worse over the past day.

## 2014-06-25 ENCOUNTER — Other Ambulatory Visit: Payer: Managed Care, Other (non HMO) | Admitting: Adult Health

## 2014-07-02 ENCOUNTER — Other Ambulatory Visit: Payer: Managed Care, Other (non HMO) | Admitting: Adult Health

## 2014-08-30 ENCOUNTER — Encounter: Payer: Self-pay | Admitting: Nurse Practitioner

## 2014-09-01 ENCOUNTER — Encounter: Payer: Self-pay | Admitting: Nurse Practitioner

## 2014-09-14 ENCOUNTER — Ambulatory Visit (INDEPENDENT_AMBULATORY_CARE_PROVIDER_SITE_OTHER): Payer: Managed Care, Other (non HMO) | Admitting: Nurse Practitioner

## 2014-09-14 VITALS — BP 122/84 | Ht 66.0 in | Wt 180.0 lb

## 2014-09-14 DIAGNOSIS — G8929 Other chronic pain: Secondary | ICD-10-CM | POA: Diagnosis not present

## 2014-09-14 DIAGNOSIS — M129 Arthropathy, unspecified: Secondary | ICD-10-CM

## 2014-09-14 DIAGNOSIS — Z79891 Long term (current) use of opiate analgesic: Secondary | ICD-10-CM | POA: Diagnosis not present

## 2014-09-14 DIAGNOSIS — B360 Pityriasis versicolor: Secondary | ICD-10-CM

## 2014-09-14 DIAGNOSIS — M549 Dorsalgia, unspecified: Secondary | ICD-10-CM | POA: Diagnosis not present

## 2014-09-14 DIAGNOSIS — M170 Bilateral primary osteoarthritis of knee: Secondary | ICD-10-CM

## 2014-09-14 MED ORDER — HYDROCODONE-ACETAMINOPHEN 10-325 MG PO TABS: ORAL_TABLET | ORAL | Status: DC

## 2014-09-14 MED ORDER — KETOCONAZOLE 200 MG PO TABS: ORAL_TABLET | ORAL | Status: DC

## 2014-09-14 NOTE — Patient Instructions (Signed)
  Selenium sulfide shampoo 10 min per day to affected area daily for one week   Tinea Versicolor Tinea versicolor is a common yeast infection of the skin. This condition becomes known when the yeast on our skin starts to overgrow (yeast is a normal inhabitant on our skin). This condition is noticed as white or light brown patches on brown skin, and is more evident in the summer on tanned skin. These areas are slightly scaly if scratched. The light patches from the yeast become evident when the yeast creates "holes in your suntan". This is most often noticed in the summer. The patches are usually located on the chest, back, pubis, neck and body folds. However, it may occur on any area of body. Mild itching and inflammation (redness or soreness) may be present. DIAGNOSIS  The diagnosisof this is made clinically (by looking). Cultures from samples are usually not needed. Examination under the microscope may help. However, yeast is normally found on skin. The diagnosis still remains clinical. Examination under Wood's Ultraviolet Light can determine the extent of the infection. TREATMENT  This common infection is usually only of cosmetic (only a concern to your appearance). It is easily treated with dandruff shampoo used during showers or bathing. Vigorous scrubbing will eliminate the yeast over several days time. The light areas in your skin may remain for weeks or months after the infection is cured unless your skin is exposed to sunlight. The lighter or darker spots caused by the fungus that remain after complete treatment are not a sign of treatment failure; it will take a long time to resolve. Your caregiver may recommend a number of commercial preparations or medication by mouth if home care is not working. Recurrence is common and preventative medication may be necessary. This skin condition is not highly contagious. Special care is not needed to protect close friends and family members. Normal hygiene is  usually enough. Follow up is required only if you develop complications (such as a secondary infection from scratching), if recommended by your caregiver, or if no relief is obtained from the preparations used. Document Released: 02/11/2000 Document Revised: 05/08/2011 Document Reviewed: 03/25/2008 [facility] Patient Information 2015 [place], [place]. This information is not intended to replace advice given to you by your health care provider. Make sure you discuss any questions you have with your health care provider.

## 2014-09-15 ENCOUNTER — Encounter: Payer: Self-pay | Admitting: Nurse Practitioner

## 2014-09-15 DIAGNOSIS — B360 Pityriasis versicolor: Secondary | ICD-10-CM | POA: Insufficient documentation

## 2014-09-15 DIAGNOSIS — Z79891 Long term (current) use of opiate analgesic: Secondary | ICD-10-CM | POA: Insufficient documentation

## 2014-09-15 NOTE — Progress Notes (Signed)
Subjective:  Presents for chronic pain particularly in the knees. Needs to have right knee replacement, has been holding off due to her age. Feels she is at the point now where she is going back to orthopedic Dr. for replacement surgery. Has noticed increased pain which has limited her activities. Has increased gabapentin dose at times which has helped. Increased her pain medicine a few times over the past month but stayed with in her monthly range. Also has chronic low back pain which may be worse with any problems. Has a rash on the trunk area that is been there for several months worse after being in the sun and tanning. Minimally pruritic. Nontender. No known contacts.  Objective:   BP 122/84 mmHg  Ht 5\' 6"  (1.676 m)  Wt 180 lb (81.647 kg)  BMI 29.07 kg/m2 NAD. Alert, oriented. Lungs clear. Heart regular rate rhythm. Circular nonraised hypopigmented areas with slight dry scaling noted in various areas over the chest and back. Some areas have coalesced into larger patches.  Assessment:  Problem List Items Addressed This Visit      Musculoskeletal and Integument   Arthritis of both knees (Chronic)   Relevant Medications   HYDROcodone-acetaminophen (NORCO) 10-325 MG per tablet   Tinea versicolor     Other   Chronic back pain   Relevant Medications   HYDROcodone-acetaminophen (NORCO) 10-325 MG per tablet   Encounter for long-term opiate analgesic use - Primary      Plan:  Meds ordered this encounter  Medications  . ketoconazole (NIZORAL) 200 MG tablet    Sig: Take 2 pills po at the same time as directed    Dispense:  2 tablet    Refill:  0    Order Specific Question:  Supervising Provider    Answer:  Merlyn AlbertLUKING, WILLIAM S [[id number]]  . DISCONTD: HYDROcodone-acetaminophen (NORCO) 10-325 MG per tablet    Sig: 1/2-1 po BID prn pain    Dispense:  45 tablet    Refill:  0    May fill 60 days from 09/14/14    Order Specific Question:  Supervising Provider    Answer:  Merlyn AlbertLUKING, WILLIAM S [[id number]]   . DISCONTD: HYDROcodone-acetaminophen (NORCO) 10-325 MG per tablet    Sig: 1/2-1 po BID prn pain    Dispense:  45 tablet    Refill:  0    May fill 30 days from 09/14/14    Order Specific Question:  Supervising Provider    Answer:  Merlyn AlbertLUKING, WILLIAM S [[id number]]  . HYDROcodone-acetaminophen (NORCO) 10-325 MG per tablet    Sig: 1/2-1 po BID prn pain    Dispense:  45 tablet    Refill:  0    Order Specific Question:  Supervising Provider    Answer:  Merlyn AlbertLUKING, WILLIAM S [[id number]]   Given instructions on how to take ketoconazole. Patient understands that skin discoloration may not go away for months. Controlled substances agreement signed today. Return in about 3 months (around 12/15/2014) for recheck. Encourage patient to follow-up with orthopedic specialist regarding potential knee replacement.

## 2014-10-15 ENCOUNTER — Telehealth: Payer: Self-pay | Admitting: Family Medicine

## 2014-10-15 NOTE — Telephone Encounter (Signed)
(  message for Eber Jones) patient was seen on 7/18 and you talked about the white spots that comes up on her body. She wants the pills called in only not cream. [employer]

## 2014-10-19 NOTE — Telephone Encounter (Signed)
Nurses: I prescribed ketoconazole tabs at last visit with instructions. If she has already done this, it is a one time treatment as I explained to patient. May take months for skin tone to come back to normal. More visible in the summer with tanning.

## 2014-10-19 NOTE — Telephone Encounter (Signed)
Spoke with patient and verified if she took the Ketoconazole tabs that were prescribed at last visit. Patient stated that she never took them because they never had them at the pharmacy. Reviewed patient's chart and saw that prescription was sent into [facility] on 09/14/14. I called pharmacy to ensure that prescription was received. Pharmacy stated that they were received on 09/14/14 patient never picked up prescription. Informed patient that prescription was at pharmacy. Patient verbalized that she would go pick up the prescription.

## 2014-11-04 ENCOUNTER — Emergency Department (HOSPITAL_COMMUNITY): Payer: Managed Care, Other (non HMO)

## 2014-11-04 ENCOUNTER — Encounter (HOSPITAL_COMMUNITY): Payer: Self-pay | Admitting: Emergency Medicine

## 2014-11-04 ENCOUNTER — Emergency Department (HOSPITAL_COMMUNITY)
Admission: EM | Admit: 2014-11-04 | Discharge: 2014-11-04 | Disposition: A | Discharge status: Home / Self Care | Payer: Managed Care, Other (non HMO) | Attending: Emergency Medicine | Admitting: Emergency Medicine

## 2014-11-04 DIAGNOSIS — Z880 Allergy status to penicillin: Secondary | ICD-10-CM | POA: Insufficient documentation

## 2014-11-04 DIAGNOSIS — Z720 Tobacco use: Secondary | ICD-10-CM | POA: Diagnosis not present

## 2014-11-04 DIAGNOSIS — Z8742 Personal history of other diseases of the female genital tract: Secondary | ICD-10-CM | POA: Insufficient documentation

## 2014-11-04 DIAGNOSIS — M542 Cervicalgia: Secondary | ICD-10-CM | POA: Diagnosis not present

## 2014-11-04 DIAGNOSIS — M1990 Unspecified osteoarthritis, unspecified site: Secondary | ICD-10-CM | POA: Insufficient documentation

## 2014-11-04 DIAGNOSIS — G8929 Other chronic pain: Secondary | ICD-10-CM | POA: Diagnosis not present

## 2014-11-04 DIAGNOSIS — Z3202 Encounter for pregnancy test, result negative: Secondary | ICD-10-CM | POA: Diagnosis not present

## 2014-11-04 DIAGNOSIS — Z79899 Other long term (current) drug therapy: Secondary | ICD-10-CM | POA: Insufficient documentation

## 2014-11-04 DIAGNOSIS — M79601 Pain in right arm: Secondary | ICD-10-CM | POA: Diagnosis present

## 2014-11-04 DIAGNOSIS — K219 Gastro-esophageal reflux disease without esophagitis: Secondary | ICD-10-CM | POA: Diagnosis not present

## 2014-11-04 LAB — CBC WITH DIFFERENTIAL/PLATELET
Basophils Absolute: 0 10*3/uL (ref 0.0–0.1)
Basophils Relative: 0 % (ref 0–1)
Eosinophils Absolute: 0.1 10*3/uL (ref 0.0–0.7)
Eosinophils Relative: 1 % (ref 0–5)
HCT: 38.6 % (ref 36.0–46.0)
Hemoglobin: 13 g/dL (ref 12.0–15.0)
Lymphocytes Relative: 22 % (ref 12–46)
Lymphs Abs: 2.2 10*3/uL (ref 0.7–4.0)
MCH: 29.7 pg (ref 26.0–34.0)
MCHC: 33.7 g/dL (ref 30.0–36.0)
MCV: 88.3 fL (ref 78.0–100.0)
Monocytes Absolute: 0.8 10*3/uL (ref 0.1–1.0)
Monocytes Relative: 8 % (ref 3–12)
Neutro Abs: 6.9 10*3/uL (ref 1.7–7.7)
Neutrophils Relative %: 69 % (ref 43–77)
Platelets: 130 10*3/uL — ABNORMAL LOW (ref 150–400)
RBC: 4.37 MIL/uL (ref 3.87–5.11)
RDW: 13.2 % (ref 11.5–15.5)
WBC: 10 10*3/uL (ref 4.0–10.5)

## 2014-11-04 LAB — BASIC METABOLIC PANEL
Anion gap: 5 (ref 5–15)
BUN: 20 mg/dL (ref 6–20)
CO2: 25 mmol/L (ref 22–32)
Calcium: 9 mg/dL (ref 8.9–10.3)
Chloride: 110 mmol/L (ref 101–111)
Creatinine, Ser: 0.7 mg/dL (ref 0.44–1.00)
GFR calc Af Amer: >60 mL/min (ref >60)
GFR calc non Af Amer: >60 mL/min (ref >60)
Glucose, Bld: 92 mg/dL (ref 65–99)
Potassium: 3.5 mmol/L (ref 3.5–5.1)
Sodium: 140 mmol/L (ref 135–145)

## 2014-11-04 LAB — TROPONIN I
Troponin I: <0.03 ng/mL (ref <0.031)
Troponin I: <0.03 ng/mL (ref <0.031)

## 2014-11-04 LAB — PREGNANCY, URINE: Preg Test, Ur: NEGATIVE

## 2014-11-04 MED ORDER — ONDANSETRON HCL 4 MG/2ML IJ SOLN
4.0000 mg | Freq: Once | INTRAMUSCULAR | Status: AC
  Administered 2014-11-04: 4 mg via INTRAVENOUS
  Filled 2014-11-04: qty 2

## 2014-11-04 MED ORDER — IBUPROFEN 800 MG PO TABS: 800.0000 mg | ORAL_TABLET | Freq: Three times a day (TID) | ORAL | Status: DC | PRN

## 2014-11-04 MED ORDER — KETOROLAC TROMETHAMINE 30 MG/ML IJ SOLN
30.0000 mg | Freq: Once | INTRAMUSCULAR | Status: AC
Start: 2014-11-04 — End: 2014-11-04
  Administered 2014-11-04: 30 mg via INTRAVENOUS
  Filled 2014-11-04: qty 1

## 2014-11-04 MED ORDER — TRAMADOL HCL 50 MG PO TABS: 50.0000 mg | ORAL_TABLET | Freq: Four times a day (QID) | ORAL | Status: DC | PRN

## 2014-11-04 MED ORDER — SODIUM CHLORIDE 0.9 % IV BOLUS (SEPSIS)
1000.0000 mL | Freq: Once | INTRAVENOUS | Status: AC
Start: 2014-11-04 — End: 2014-11-04
  Administered 2014-11-04: 1000 mL via INTRAVENOUS

## 2014-11-04 MED ORDER — FENTANYL CITRATE (PF) 100 MCG/2ML IJ SOLN
50.0000 ug | Freq: Once | INTRAMUSCULAR | Status: AC
  Administered 2014-11-04: 50 ug via INTRAVENOUS
  Filled 2014-11-04: qty 2

## 2014-11-04 NOTE — ED Notes (Signed)
Pt c/o rt arm pain that radiates to neck and jaw area. Pt was at work when this started.

## 2014-11-04 NOTE — ED Notes (Signed)
Patient states that she has had medication for nausea and pain but no change.

## 2014-11-04 NOTE — ED Provider Notes (Signed)
TIME SEEN: 2:30 AM  CHIEF COMPLAINT: Right arm and neck pain, numbness in her teeth, nausea, lightheadedness, "felt winded"  HPI: Pt is a 44 y.o. female with history of chronic back and knee pain from arthritis, GERD who presents to the emergency department with complaints of right upper arm aching that radiated up into her right neck and right back that started while at work tonight. States she had a "numbness" in her teeth on the right side. Also felt like she was "winded". Has had some nausea but no vomiting. Reports feeling slightly dizzy. Denies any chest pain or chest discomfort. No diaphoresis. States she did feel flushed. No vomiting or diarrhea. No fever or cough. Denies a history of CAD. No history of stress test or cardiac catheterization. States she does have a brother who had an MI in his 41s. She is a smoker. No history of hypertension, diabetes or hyperlipidemia. No history of PE or DVT, recent prolonged immobilization such as long flight or hospice and, fracture, surgery, trauma. No exogenous estrogen use. Has chronic swelling of bilateral knees from her arthritis but no calf tenderness. She denies any aggravating or relieving factors. Describes the pain as mild.  States that coworkers took her blood pressure and it was in the 150/80s which concerned her because she is normally in the 110s/60s.  ROS: See HPI Constitutional: no fever  Eyes: no drainage  ENT: no runny nose   Cardiovascular:  no chest pain  Resp: SOB  GI: no vomiting GU: no dysuria Integumentary: no rash  Allergy: no hives  Musculoskeletal: no leg swelling  Neurological: no slurred speech ROS otherwise negative  PAST MEDICAL HISTORY/PAST SURGICAL HISTORY:  Past Medical History  Diagnosis Date  . Chronic knee pain   . Chronic back pain   . Menorrhagia 09/09/2012  . Arthritis     Knee  . Family history of anesthesia complication     Brother and Mother woke up during surgery; Heard and felt could not speak  .  GERD (gastroesophageal reflux disease)     MEDICATIONS:  Prior to Admission medications   Medication Sig Start Date End Date Taking? Authorizing Provider  Calcium Carbonate Antacid (TUMS PO) Take by mouth.   Yes Historical Provider, MD  colestipol (COLESTID) 1 G tablet Take 2 tablets (2 g total) by mouth 2 (two) times daily. 06/01/14  Yes Campbell Riches, NP  gabapentin (NEURONTIN) 300 MG capsule Take 1 capsule (300 mg total) by mouth 3 (three) times daily. 04/01/14  Yes Campbell Riches, NP  HYDROcodone-acetaminophen [facility]) 10-325 MG per tablet 1/2-1 po BID prn pain 09/14/14  Yes Campbell Riches, NP  Ibuprofen (ADVIL PO) Take by mouth.   Yes Historical Provider, MD  ketoconazole (NIZORAL) 200 MG tablet Take 2 pills po at the same time as directed 09/14/14   Campbell Riches, NP    ALLERGIES:  Allergies  Allergen Reactions  . Penicillins Nausea And Vomiting  . Sulfa Antibiotics Swelling  . Xanax [Alprazolam] Other (See Comments)    Increased her mood when taking the Xanax.     SOCIAL HISTORY:  Social History  Substance Use Topics  . Smoking status: Current Every Day Smoker -- 1.00 packs/day for 24 years    Types: Cigarettes    Start date: 11/26/1983  . Smokeless tobacco: Never Used     Comment: quit 3 times for a total of 8 years since 1985  . Alcohol Use: No     Comment: socially just mixed  drinks     FAMILY HISTORY: Family History  Problem Relation Age of Onset  . Hypertension Mother   . Diabetes Mother   . Thyroid disease Mother   . COPD Mother   . Arthritis Mother   . Depression Mother   . Mental illness Mother   . Stroke Mother   . Prostate cancer Father     metastasized to colon  . Diabetes Maternal Grandmother   . Diabetes Paternal Grandmother   . Cancer Sister   . Colon cancer Brother     died at age 82  . Heart attack Brother   . Diabetes Sister   . Depression Sister   . Mental illness Sister     EXAM: BP 140/87 mmHg  Pulse 73  Temp(Src) 98 F  (36.7 C)  Resp 18  Ht 5\' 6"  (1.676 m)  Wt 178 lb (80.74 kg)  BMI 28.74 kg/m2  SpO2 100%  LMP 10/19/2014 CONSTITUTIONAL: Alert and oriented and responds appropriately to questions. Well-appearing; well-nourished HEAD: Normocephalic EYES: Conjunctivae clear, PERRL ENT: normal nose; no rhinorrhea; moist mucous membranes; pharynx without lesions noted, multiple cavities with fillings noted but no sign of abscess, no Ludwig's age and a, no stridor, normal phonation, no trismus or drooling, no tonsillar hypertrophy or exudate, no uvular deviation NECK: Supple, no meningismus, no LAD, neck is nontender to palpation, no midline spinal tenderness or step-off or deformity, some tightness over the right trapezius muscle  CARD: RRR; S1 and S2 appreciated; no murmurs, no clicks, no rubs, no gallops RESP: Normal chest excursion without splinting or tachypnea; breath sounds clear and equal bilaterally; no wheezes, no rhonchi, no rales, no hypoxia or respiratory distress, speaking full sentences CHEST:  Chest wall nontender to palpation without crepitus or ecchymosis or deformity ABD/GI: Normal bowel sounds; non-distended; soft, non-tender, no rebound, no guarding, no peritoneal signs BACK:  The back appears normal and is non-tender to palpation, there is no CVA tenderness EXT: Right arm is nontender to palpation, component soft, 2+ radial pulses bilaterally, Normal ROM in all joints; non-tender to palpation; small amount of swelling of her bilateral knees which he reports is chronic, no erythema or warmth, no edema; normal capillary refill; no cyanosis, no calf tenderness or swelling    SKIN: Normal color for age and race; warm NEURO: Moves all extremities equally, sensation to light touch intact diffusely, cranial nerves II through XII intact, strength 5/5 in all 4 choice, normal gait PSYCH: The patient's mood and manner are appropriate. Grooming and personal hygiene are appropriate.  MEDICAL DECISION  MAKING: Patient here with symptoms of right upper arm pain that radiates into her right neck. States she did feel slightly short of breath, nauseous and dizzy earlier. States she was at work when the symptoms started lifting heavy boxes. No chest pain or chest discomfort. She has no risk factors for ACS other than a family history history of tobacco use. Her EKG shows no ischemic changes.  No risk factors for pulmonary embolus other than age. Her HEART score is 2.  We'll obtain cardiac labs, chest x-ray. We'll give IV fluids for her dizziness, Toradol for her pain and Zofran for her nausea. Suspicion of this is ACS, dissection is low.  ED PROGRESS: Patient's labs are unremarkable including 2 negative troponins. Chest x-ray clear. Pain in her right arm and right neck has significant improved after Toradol and one dose of fentanyl. Discussed with patient that this may be missed skeletal in nature and my suspicion for  ACS is low. Have recommended outpatient follow-up with her primary care provider. Discussed return precautions. We'll discharge with tramadol to take as needed for pain. We'll provide work note for the next several days that she can rest. She verbalized understanding and is comfortable with this plan. Her husband is at bedside to take her home.    EKG Interpretation  Date/Time:  Wednesday November 04 2014 02:26:39 EDT Ventricular Rate:  74 PR Interval:  165 QRS Duration: 93 QT Interval:  398 QTC Calculation: 442 R Axis:   10 Text Interpretation:  Sinus rhythm Abnormal R-wave progression, early transition Baseline wander in lead(s) V6 No significant change since last tracing Reconfirmed by WARD,  DO, KRISTEN [phone]) on 11/04/2014 2:32:27 AM        Layla Maw Ward, DO 11/04/14 1914

## 2014-11-04 NOTE — Discharge Instructions (Signed)
Possible Cervical Sprain A cervical sprain is an injury in the neck in which the strong, fibrous tissues (ligaments) that connect your neck bones stretch or tear. Cervical sprains can range from mild to severe. Severe cervical sprains can cause the neck vertebrae to be unstable. This can lead to damage of the spinal cord and can result in serious nervous system problems. The amount of time it takes for a cervical sprain to get better depends on the cause and extent of the injury. Most cervical sprains heal in 1 to 3 weeks. CAUSES  Severe cervical sprains may be caused by:   Contact sport injuries (such as from football, rugby, wrestling, hockey, auto racing, gymnastics, diving, martial arts, or boxing).   Motor vehicle collisions.   Whiplash injuries. This is an injury from a sudden forward and backward whipping movement of the head and neck.  Falls.  Mild cervical sprains may be caused by:   Being in an awkward position, such as while cradling a telephone between your ear and shoulder.   Sitting in a chair that does not offer proper support.   Working at a poorly Marketing executive station.   Looking up or down for long periods of time.  SYMPTOMS   Pain, soreness, stiffness, or a burning sensation in the front, back, or sides of the neck. This discomfort may develop immediately after the injury or slowly, 24 hours or more after the injury.   Pain or tenderness directly in the middle of the back of the neck.   Shoulder or upper back pain.   Limited ability to move the neck.   Headache.   Dizziness.   Weakness, numbness, or tingling in the hands or arms.   Muscle spasms.   Difficulty swallowing or chewing.   Tenderness and swelling of the neck.  DIAGNOSIS  Most of the time your health care provider can diagnose a cervical sprain by taking your history and doing a physical exam. Your health care provider will ask about previous neck injuries and any known  neck problems, such as arthritis in the neck. X-rays may be taken to find out if there are any other problems, such as with the bones of the neck. Other tests, such as a CT scan or MRI, may also be needed.  TREATMENT  Treatment depends on the severity of the cervical sprain. Mild sprains can be treated with rest, keeping the neck in place (immobilization), and pain medicines. Severe cervical sprains are immediately immobilized. Further treatment is done to help with pain, muscle spasms, and other symptoms and may include:  Medicines, such as pain relievers, numbing medicines, or muscle relaxants.   Physical therapy. This may involve stretching exercises, strengthening exercises, and posture training. Exercises and improved posture can help stabilize the neck, strengthen muscles, and help stop symptoms from returning.  HOME CARE INSTRUCTIONS   Put ice on the injured area.   Put ice in a plastic bag.   Place a towel between your skin and the bag.   Leave the ice on for 15-20 minutes, 3-4 times a day.   If your injury was severe, you may have been given a cervical collar to wear. A cervical collar is a two-piece collar designed to keep your neck from moving while it heals.  Do not remove the collar unless instructed by your health care provider.  If you have long hair, keep it outside of the collar.  Ask your health care provider before making any adjustments to your collar.  Minor adjustments may be required over time to improve comfort and reduce pressure on your chin or on the back of your head.  Ifyou are allowed to remove the collar for cleaning or bathing, follow your health care provider's instructions on how to do so safely.  Keep your collar clean by wiping it with mild soap and water and drying it completely. If the collar you have been given includes removable pads, remove them every 1-2 days and hand wash them with soap and water. Allow them to air dry. They should be  completely dry before you wear them in the collar.  If you are allowed to remove the collar for cleaning and bathing, wash and dry the skin of your neck. Check your skin for irritation or sores. If you see any, tell your health care provider.  Do not drive while wearing the collar.   Only take over-the-counter or prescription medicines for pain, discomfort, or fever as directed by your health care provider.   Keep all follow-up appointments as directed by your health care provider.   Keep all physical therapy appointments as directed by your health care provider.   Make any needed adjustments to your workstation to promote good posture.   Avoid positions and activities that make your symptoms worse.   Warm up and stretch before being active to help prevent problems.  SEEK MEDICAL CARE IF:   Your pain is not controlled with medicine.   You are unable to decrease your pain medicine over time as planned.   Your activity level is not improving as expected.  SEEK IMMEDIATE MEDICAL CARE IF:   You develop any bleeding.  You develop stomach upset.  You have signs of an allergic reaction to your medicine.   Your symptoms get worse.   You develop new, unexplained symptoms.   You have numbness, tingling, weakness, or paralysis in any part of your body.  MAKE SURE YOU:   Understand these instructions.  Will watch your condition.  Will get help right away if you are not doing well or get worse. Document Released: 12/11/2006 Document Revised: 02/18/2013 Document Reviewed: 08/21/2012 [facility] Patient Information 2015 [place], [place]. This information is not intended to replace advice given to you by your health care provider. Make sure you discuss any questions you have with your health care provider.    Heat Therapy Heat therapy can help ease sore, stiff, injured, and tight muscles and joints. Heat relaxes your muscles, which may help ease your pain.  RISKS AND  COMPLICATIONS If you have any of the following conditions, do not use heat therapy unless your health care provider has approved:  Poor circulation.  Healing wounds or scarred skin in the area being treated.  Diabetes, heart disease, or high blood pressure.  Not being able to feel (numbness) the area being treated.  Unusual swelling of the area being treated.  Active infections.  Blood clots.  Cancer.  Inability to communicate pain. This may include young children and people who have problems with their brain function (dementia).  Pregnancy. Heat therapy should only be used on old, pre-existing, or long-lasting (chronic) injuries. Do not use heat therapy on new injuries unless directed by your health care provider. HOW TO USE HEAT THERAPY There are several different kinds of heat therapy, including:  Moist heat pack.  Warm water bath.  Hot water bottle.  Electric heating pad.  Heated gel pack.  Heated wrap.  Electric heating pad. Use the heat therapy method suggested  by your health care provider. Follow your health care provider's instructions on when and how to use heat therapy. GENERAL HEAT THERAPY RECOMMENDATIONS  Do not sleep while using heat therapy. Only use heat therapy while you are awake.  Your skin may turn pink while using heat therapy. Do not use heat therapy if your skin turns red.  Do not use heat therapy if you have new pain.  High heat or long exposure to heat can cause burns. Be careful when using heat therapy to avoid burning your skin.  Do not use heat therapy on areas of your skin that are already irritated, such as with a rash or sunburn. SEEK MEDICAL CARE IF:  You have blisters, redness, swelling, or numbness.  You have new pain.  Your pain is worse. MAKE SURE YOU:  Understand these instructions.  Will watch your condition.  Will get help right away if you are not doing well or get worse. Document Released: 05/08/2011 Document  Revised: 06/30/2013 Document Reviewed: 04/08/2013 [facility] Patient Information 2015 [place], [place]. This information is not intended to replace advice given to you by your health care provider. Make sure you discuss any questions you have with your health care provider.

## 2014-11-05 ENCOUNTER — Encounter: Payer: Self-pay | Admitting: Nurse Practitioner

## 2014-11-06 ENCOUNTER — Encounter: Payer: Self-pay | Admitting: Family Medicine

## 2014-11-06 ENCOUNTER — Ambulatory Visit (INDEPENDENT_AMBULATORY_CARE_PROVIDER_SITE_OTHER): Payer: Managed Care, Other (non HMO) | Admitting: Family Medicine

## 2014-11-06 VITALS — BP 128/82 | Ht 66.0 in | Wt 178.2 lb

## 2014-11-06 DIAGNOSIS — M5441 Lumbago with sciatica, right side: Secondary | ICD-10-CM

## 2014-11-06 DIAGNOSIS — G4440 Drug-induced headache, not elsewhere classified, not intractable: Secondary | ICD-10-CM | POA: Diagnosis not present

## 2014-11-06 DIAGNOSIS — Z131 Encounter for screening for diabetes mellitus: Secondary | ICD-10-CM | POA: Diagnosis not present

## 2014-11-06 DIAGNOSIS — Z13220 Encounter for screening for lipoid disorders: Secondary | ICD-10-CM

## 2014-11-06 DIAGNOSIS — Z6379 Other stressful life events affecting family and household: Secondary | ICD-10-CM | POA: Insufficient documentation

## 2014-11-06 MED ORDER — DULOXETINE HCL 20 MG PO CPEP: 20.0000 mg | ORAL_CAPSULE | Freq: Every day | ORAL | Status: DC

## 2014-11-06 NOTE — Progress Notes (Signed)
Subjective:    Patient ID: [name], female    DOB: May 27, 1970, 43 y.o.   MRN: [id number]  HPI  Patient arrives for a follow up from the ER for right arm pain and tingling. The patient states that her workplace she started having a tingling in the right arm right side of her face and a funny feeling in her chest this lasted for approximately 10-20 minutes went to the ER because her blood pressure was elevated she was worried she was having a heart attack or possibly a stroke she does smoke she does have family history testing that they did in the ER including EKG looks good lab work looked good cardiac enzymes look good her pain one way she was not having any the following day she states she's been able to do some walking and physical activity without any chest tightness pressure pain she denies any PND denies hemoptysis denies fevers chills denies shortness of breath or pleuritic chest pain  She has been under a lot of stress her mother is elderly he has mental health problems along with a unusual seizure-like condition that is been stressing her she's been taking care of her mother she also has 4 children that she takes care of and she also finds herself at times feeling overwhelmed and stressed she does smoke she notes she needs to quit she has not  She has used gabapentin in order to try to help her with sciatica symptoms she uses hydrocodone occasionally when the pain is severe  She also has significant loose bowel issues since having her gallbladder taken out several years ago she uses a medication for it to help  Frequent headaches over the past several months that she relates persists throughout the day gets better with good powders and she also admits that she drinks 2 cans of energy drink at homework per day plus also taking 2 good powders per day Review of Systems  Constitutional: Negative for activity change, appetite change and fatigue.  HENT: Negative for congestion.     Respiratory: Negative for cough.   Cardiovascular: Negative for chest pain.  Gastrointestinal: Negative for abdominal pain.  Endocrine: Negative for polydipsia and polyphagia.  Neurological: Negative for weakness.  Psychiatric/Behavioral: Negative for confusion.       Objective:   Physical Exam  Constitutional: She appears well-nourished. No distress.  Cardiovascular: Normal rate, regular rhythm and normal heart sounds.   No murmur heard. Pulmonary/Chest: Effort normal and breath sounds normal. No respiratory distress.  Musculoskeletal: She exhibits no edema.  Lymphadenopathy:    She has no cervical adenopathy.  Neurological: She is alert. She exhibits normal muscle tone.  Psychiatric: Her behavior is normal.  Vitals reviewed.         Assessment & Plan:  I doubt this patient has any type underlying cardiac disease I did discuss with her stress management also discussed with her managing risk factors for heart disease encourage her to quit smoking I also encouraged her to minimize fatty foods to exercise on a regular basis and check her lipids and glucose profile  I feel that this chest discomfort was probably stress related pain patient states at times tearful I believe the patient would benefit from Cymbalta generic was started. Patient encouraged to follow-up within 4 weeks  She may continue gabapentin for this sciatica.  Patient also is having frequent headaches that I believe are rebound headaches related to her overuse of caffeine as well as taking 2 good powders  every single day. Patient states she wakes up in the morning with a headache but it does not cause double vision nausea or vomiting  I discussed with the patient importance of gradually tapering her caffeine. Hopefully over the next 6-8 weeks she can taper down in her headaches will be much better

## 2014-11-20 ENCOUNTER — Other Ambulatory Visit (HOSPITAL_COMMUNITY): Payer: Self-pay | Admitting: Orthopedic Surgery

## 2014-11-27 ENCOUNTER — Encounter: Payer: Self-pay | Admitting: Nurse Practitioner

## 2014-11-27 ENCOUNTER — Ambulatory Visit: Payer: Managed Care, Other (non HMO) | Admitting: Nurse Practitioner

## 2014-11-27 ENCOUNTER — Ambulatory Visit (INDEPENDENT_AMBULATORY_CARE_PROVIDER_SITE_OTHER): Payer: Managed Care, Other (non HMO) | Admitting: Nurse Practitioner

## 2014-11-27 VITALS — BP 130/80 | Ht 66.0 in | Wt 178.1 lb

## 2014-11-27 DIAGNOSIS — F411 Generalized anxiety disorder: Secondary | ICD-10-CM

## 2014-11-27 DIAGNOSIS — F419 Anxiety disorder, unspecified: Secondary | ICD-10-CM | POA: Diagnosis not present

## 2014-11-27 DIAGNOSIS — M94261 Chondromalacia, right knee: Secondary | ICD-10-CM | POA: Diagnosis not present

## 2014-11-27 DIAGNOSIS — F430 Acute stress reaction: Principal | ICD-10-CM

## 2014-11-27 MED ORDER — HYDROCODONE-ACETAMINOPHEN 10-325 MG PO TABS: ORAL_TABLET | ORAL | Status: DC

## 2014-11-27 MED ORDER — DULOXETINE HCL 30 MG PO CPEP: 30.0000 mg | ORAL_CAPSULE | Freq: Every day | ORAL | Status: DC

## 2014-11-28 ENCOUNTER — Encounter: Payer: Self-pay | Admitting: Nurse Practitioner

## 2014-11-28 DIAGNOSIS — M94261 Chondromalacia, right knee: Secondary | ICD-10-CM | POA: Insufficient documentation

## 2014-11-28 DIAGNOSIS — F430 Acute stress reaction: Principal | ICD-10-CM

## 2014-11-28 DIAGNOSIS — F411 Generalized anxiety disorder: Secondary | ICD-10-CM | POA: Insufficient documentation

## 2014-11-28 NOTE — Progress Notes (Signed)
Subjective:  Presents for recheck after starting Cymbalta 20 mg. States emotions are much more stable at this point. Her daughter is present during office visit who agrees she is seen improvement. Is scheduled for right knee replacement surgery on 10/17. Between financial issues from being out of work, her mother having significant illness as well as dealing with the emotional issues of having 2 children who are seniors and will be going off to college, patient has been under significant amount of stress. Requesting a slight increase in the number of hydrocodone tablets per day until her surgery. Currently taking 45 pills per month but has increased to twice a day for now.  Objective:   BP 130/80 mmHg  Ht  (1.676 m)  Wt 178 lb 2 oz (80.797 kg)  BMI 28.76 kg/m2  LMP 10/19/2014 NAD. Alert, oriented. Lungs clear. Heart regular rate rhythm.  Assessment:  Problem List Items Addressed This Visit      Musculoskeletal and Integument   Chondromalacia of right knee requiring joint replacement      Other   Anxiety as acute reaction to exceptional stress - Primary   Relevant Medications   DULoxetine (CYMBALTA) 30 MG capsule       Plan:  Meds ordered this encounter  Medications  . RaNITidine HCl (RANITIDINE 75 PO)    Sig: Take 75 mg by mouth.  . DULoxetine (CYMBALTA) 30 MG capsule    Sig: Take 1 capsule (30 mg total) by mouth daily.    Dispense:  30 capsule    Refill:  5    Order Specific Question:  Supervising Provider    Answer:  Merlyn Albert [[id number]]  . HYDROcodone-acetaminophen (NORCO) 10-325 MG tablet    Sig: 1/2-1 po BID prn pain    Dispense:  60 tablet    Refill:  0    Order Specific Question:  Supervising Provider    Answer:  Merlyn Albert [[id number]]   Increase Cymbalta to 30 mg daily. Patient to call back if any problems with new dose. Given 1 prescription for hydrocodone No. 60 which should last until her surgery. Further pain management will be determined at that time  probably by orthopedic surgeon at least temporarily. Recommend follow-up here after her surgery for recheck.

## 2014-12-04 ENCOUNTER — Encounter (HOSPITAL_COMMUNITY): Payer: Self-pay

## 2014-12-04 ENCOUNTER — Encounter (HOSPITAL_COMMUNITY)
Admission: RE | Admit: 2014-12-04 | Discharge: 2014-12-04 | Disposition: A | Discharge status: Home / Self Care | Payer: Managed Care, Other (non HMO) | Source: Ambulatory Visit | Attending: Orthopedic Surgery | Admitting: Orthopedic Surgery

## 2014-12-04 ENCOUNTER — Ambulatory Visit (HOSPITAL_COMMUNITY): Admission: RE | Admit: 2014-12-04 | Payer: Managed Care, Other (non HMO) | Source: Ambulatory Visit

## 2014-12-04 DIAGNOSIS — M179 Osteoarthritis of knee, unspecified: Secondary | ICD-10-CM | POA: Diagnosis not present

## 2014-12-04 DIAGNOSIS — Z01818 Encounter for other preprocedural examination: Secondary | ICD-10-CM | POA: Diagnosis not present

## 2014-12-04 DIAGNOSIS — K219 Gastro-esophageal reflux disease without esophagitis: Secondary | ICD-10-CM | POA: Diagnosis not present

## 2014-12-04 DIAGNOSIS — F419 Anxiety disorder, unspecified: Secondary | ICD-10-CM | POA: Insufficient documentation

## 2014-12-04 DIAGNOSIS — Z0183 Encounter for blood typing: Secondary | ICD-10-CM | POA: Insufficient documentation

## 2014-12-04 DIAGNOSIS — Z01812 Encounter for preprocedural laboratory examination: Secondary | ICD-10-CM | POA: Diagnosis not present

## 2014-12-04 DIAGNOSIS — F17200 Nicotine dependence, unspecified, uncomplicated: Secondary | ICD-10-CM | POA: Diagnosis not present

## 2014-12-04 DIAGNOSIS — Z79899 Other long term (current) drug therapy: Secondary | ICD-10-CM | POA: Insufficient documentation

## 2014-12-04 HISTORY — DX: Headache, unspecified: R51.9

## 2014-12-04 HISTORY — DX: Anxiety disorder, unspecified: F41.9

## 2014-12-04 HISTORY — DX: Headache: R51

## 2014-12-04 LAB — URINALYSIS, ROUTINE W REFLEX MICROSCOPIC
Bilirubin Urine: NEGATIVE
Glucose, UA: NEGATIVE mg/dL
Hgb urine dipstick: NEGATIVE
Ketones, ur: NEGATIVE mg/dL
Leukocytes, UA: NEGATIVE
Nitrite: NEGATIVE
Protein, ur: NEGATIVE mg/dL
Specific Gravity, Urine: 1.028 (ref 1.005–1.030)
Urobilinogen, UA: 0.2 mg/dL (ref 0.0–1.0)
pH: 5.5 (ref 5.0–8.0)

## 2014-12-04 LAB — BASIC METABOLIC PANEL
Anion gap: 7 (ref 5–15)
BUN: 11 mg/dL (ref 6–20)
CO2: 28 mmol/L (ref 22–32)
Calcium: 9.2 mg/dL (ref 8.9–10.3)
Chloride: 106 mmol/L (ref 101–111)
Creatinine, Ser: 0.72 mg/dL (ref 0.44–1.00)
GFR calc Af Amer: >60 mL/min (ref >60)
GFR calc non Af Amer: >60 mL/min (ref >60)
Glucose, Bld: 50 mg/dL — ABNORMAL LOW (ref 65–99)
Potassium: 3.5 mmol/L (ref 3.5–5.1)
Sodium: 141 mmol/L (ref 135–145)

## 2014-12-04 LAB — ABO/RH: ABO/RH(D): A POS

## 2014-12-04 LAB — CBC
HCT: 43.4 % (ref 36.0–46.0)
Hemoglobin: 14.4 g/dL (ref 12.0–15.0)
MCH: 29.7 pg (ref 26.0–34.0)
MCHC: 33.2 g/dL (ref 30.0–36.0)
MCV: 89.5 fL (ref 78.0–100.0)
Platelets: 138 10*3/uL — ABNORMAL LOW (ref 150–400)
RBC: 4.85 MIL/uL (ref 3.87–5.11)
RDW: 13.5 % (ref 11.5–15.5)
WBC: 8.2 10*3/uL (ref 4.0–10.5)

## 2014-12-04 LAB — HCG, SERUM, QUALITATIVE: Preg, Serum: NEGATIVE

## 2014-12-04 LAB — TYPE AND SCREEN
ABO/RH(D): A POS
Antibody Screen: NEGATIVE

## 2014-12-04 LAB — SURGICAL PCR SCREEN
MRSA, PCR: NEGATIVE
Staphylococcus aureus: NEGATIVE

## 2014-12-04 NOTE — Pre-Procedure Instructions (Addendum)
[name]  12/04/2014      [id number] - [place], [place] - [address] [place] [address] Phone: [phone] Fax: [phone]  [id number] - [place], [place] - [address] [place] [address] Phone: [phone] Fax: [phone]  [facility] - [place], [place] - [address] [place] [address] Phone: [phone] Fax: [phone]    Your procedure is scheduled on 12/15/14  [facility] short stay admitting at 845 A.M.  Call this number if you have problems the morning of surgery:  [phone]   Remember:  Do not eat food or drink liquids after midnight.  Take these medicines the morning of surgery with A SIP OF WATER ,gabapentin,ranitidine, pain med if needed  STOP all herbel meds, nsaids (aleve,naproxen,advil,ibuprofen) 5 days prior to surgery starting 12/10/14 including vitamins, aspirin,bc   Do not wear jewelry, make-up or nail polish.  Do not wear lotions, powders, or perfumes.  You may wear deodorant.  Do not shave 48 hours prior to surgery.  Men may shave face and neck.  Do not bring valuables to the hospital.  [facility] is not responsible for any belongings or valuables.  Contacts, dentures or bridgework may not be worn into surgery.  Leave your suitcase in the car.  After surgery it may be brought to your room.  For patients admitted to the hospital, discharge time will be determined by your treatment team.  Patients discharged the day of surgery will not be allowed to drive home.   Name and phone number of your driver:    Special instructions:   Special Instructions: [facility] - Preparing for Surgery  Before surgery, you can play an important role.  Because skin is not sterile, your skin needs to be as free of germs as possible.  You can reduce the number of germs on you skin by washing with CHG (chlorahexidine gluconate) soap  before surgery.  CHG is an antiseptic cleaner which kills germs and bonds with the skin to continue killing germs even after washing.  Please DO NOT use if you have an allergy to CHG or antibacterial soaps.  If your skin becomes reddened/irritated stop using the CHG and inform your nurse when you arrive at Short Stay.  Do not shave (including legs and underarms) for at least 48 hours prior to the first CHG shower.  You may shave your face.  Please follow these instructions carefully:   1.  Shower with CHG Soap the night before surgery and the morning of Surgery.  2.  If you choose to wash your hair, wash your hair first as usual with your normal shampoo.  3.  After you shampoo, rinse your hair and body thoroughly to remove the Shampoo.  4.  Use CHG as you would any other liquid soap.  You can apply chg directly  to the skin and wash gently with scrungie or a clean washcloth.  5.  Apply the CHG Soap to your body ONLY FROM THE NECK DOWN.  Do not use on open wounds or open sores.  Avoid contact with your eyes ears, mouth and genitals (private parts).  Wash genitals (private parts)       with your normal soap.  6.  Wash thoroughly, paying special attention to the area where your surgery will be performed.  7.  Thoroughly rinse your  body with warm water from the neck down.  8.  DO NOT shower/wash with your normal soap after using and rinsing off the CHG Soap.  9.  Pat yourself dry with a clean towel.            10.  Wear clean pajamas.            11.  Place clean sheets on your bed the night of your first shower and do not sleep with pets.  Day of Surgery  Do not apply any lotions/deodorants the morning of surgery.  Please wear clean clothes to the hospital/surgery center.  Please read over the following fact sheets that you were given. Pain Booklet, Coughing and Deep Breathing, Blood Transfusion Information, Total Joint Packet, MRSA Information and Surgical Site Infection Prevention

## 2014-12-05 LAB — URINE CULTURE: Culture: NO GROWTH

## 2014-12-07 NOTE — Progress Notes (Signed)
Anesthesia Chart Review:  Pt is 44 year old female scheduled for R total knee arthroplasty on 12/15/2014 with Dr. August Saucer.   PMH includes: Anxiety, GERD. Current smoker. BMI 29. S/p D&C, hysteroscopy 13/20/14. S/p laparoscopic cholecystectomy 01/17/13.   Medications include: cymbalta, zantac.   Preoperative labs reviewed.  Glucose 50.  Will get CBG DOS.   Chest x-ray 11/04/2014 reviewed. No active cardiopulmonary disease  EKG 11/04/2014: Sinus rhythm. Abnormal R-wave progression, early transition. Baseline wander in lead(s) V6  If CBG acceptable DOS, I anticipate pt can proceed as scheduled.   Rica Mast, FNP-BC [facility] Short Stay Surgical Center/Anesthesiology Phone: [phone] 12/07/2014 3:10 PM

## 2014-12-14 NOTE — H&P (Signed)
TOTAL KNEE ADMISSION H&P  Patient is being admitted for right total knee arthroplasty.  Subjective:  Chief Complaint:right knee pain.  HPI: [name], 44 y.o. female, has a history of pain and functional disability in the right knee due to arthritis and has failed non-surgical conservative treatments for greater than 12 weeks to includeNSAID's and/or analgesics, corticosteriod injections, flexibility and strengthening excercises and activity modification.  Onset of symptoms was gradual, starting 8 years ago with gradually worsening course since that time. The patient noted no past surgery on the right knee(s).  Patient currently rates pain in the right knee(s) at 9 out of 10 with activity. Patient has night pain, worsening of pain with activity and weight bearing, pain that interferes with activities of daily living, pain with passive range of motion, crepitus and joint swelling.  Patient has evidence of subchondral cysts, subchondral sclerosis and joint space narrowing by imaging studies. This patient has had Significant pain which is interfering with her ADLs and her ability to work. There is no active infection.  Patient Active Problem List   Diagnosis Date Noted  . Anxiety as acute reaction to exceptional stress 11/28/2014  . Chondromalacia of right knee 11/28/2014  . Rebound headache 11/06/2014  . Stressful life event affecting family 11/06/2014  . Encounter for long-term opiate analgesic use 09/15/2014  . Tinea versicolor 09/15/2014  . Chronic back pain 06/16/2014  . Arthritis of both knees 06/16/2014  . DUB (dysfunctional uterine bleeding) 02/17/2013  . Family history of colon cancer 12/19/2012  . Dyspepsia 12/17/2012  . GERD (gastroesophageal reflux disease) 11/03/2012  . Analgesic overuse headache 11/03/2012  . Low back pain 09/09/2012  . Menorrhagia 09/09/2012  . Arthritis 07/24/2012   Past Medical History  Diagnosis Date  . Chronic knee pain   . Chronic back pain   .  Menorrhagia 09/09/2012  . Arthritis     Knee  . GERD (gastroesophageal reflux disease)   . Family history of anesthesia complication     Brother and Mother woke up during surgery; Heard and felt could not speak  . Anxiety   . Headache     stress    Past Surgical History  Procedure Laterality Date  . Cesarean section      X2   . Esophagogastroduodenoscopy N/A 12/27/2012    Procedure: ESOPHAGOGASTRODUODENOSCOPY (EGD);  Surgeon: West Bali, MD;  Location: [facility];  Service: Endoscopy;  Laterality: N/A;  2:30-moved to 13:45 Soledad Gerlach notified pt  . Tubal ligation      along with c-section  . Dilitation & currettage/hystroscopy with thermachoice ablation N/A 02/25/2013    Procedure: DILATATION & CURETTAGE/HYSTEROSCOPY WITH THERMACHOICE ABLATION;  Surgeon: [name], MD;  Location: [facility] ORS;  Service: Gynecology;  Laterality: N/A;  Total Therapy Time:8:54 minutes Temperature:87 degrees  . Endometrial ablation  12/14  . Cholecystectomy N/A 01/17/2013    Procedure: LAPAROSCOPIC CHOLECYSTECTOMY;  Surgeon: Marlane Hatcher, MD;  Location: [facility]S;  Service: General;  Laterality: N/A;    No prescriptions prior to admission   Allergies  Allergen Reactions  . Penicillins Nausea And Vomiting  . Sulfa Antibiotics Swelling  . Xanax [Alprazolam] Other (See Comments)    Increased her mood when taking the Xanax.     Social History  Substance Use Topics  . Smoking status: Current Every Day Smoker -- 1.00 packs/day for 24 years    Types: Cigarettes    Start date: 11/26/1983  . Smokeless tobacco: Never Used  Comment: quit 3 times for a total of 8 years since 1985  . Alcohol Use: No     Comment: socially just mixed drinks     Family History  Problem Relation Age of Onset  . Hypertension Mother   . Diabetes Mother   . Thyroid disease Mother   . COPD Mother   . Arthritis Mother   . Depression Mother   . Mental illness Mother   . Stroke Mother   . Prostate cancer Father      metastasized to colon  . Diabetes Maternal Grandmother   . Diabetes Paternal Grandmother   . Cancer Sister   . Colon cancer Brother     died at age 46  . Heart attack Brother   . Diabetes Sister   . Depression Sister   . Mental illness Sister      Review of Systems  Constitutional: Negative.   HENT: Negative.   Eyes: Negative.   Respiratory: Negative.   Cardiovascular: Negative.   Gastrointestinal: Negative.   Genitourinary: Negative.   Musculoskeletal: Positive for joint pain.  Skin: Negative.   Neurological: Negative.   Endo/Heme/Allergies: Negative.   Psychiatric/Behavioral: Negative.     Objective:  Physical Exam  Constitutional: She appears well-developed.  HENT:  Head: Normocephalic.  Eyes: Pupils are equal, round, and reactive to light.  Neck: Normal range of motion.  Cardiovascular: Normal rate.   Respiratory: Effort normal.  Neurological: She is alert.  Skin: Skin is warm.  Psychiatric: She has a normal mood and affect.   examination of the right knee demonstrates good range of motion patellofemoral crepitus and medial joint line tenderness is present collateral cruciate ligament are stable extensor mechanism is intact mild effusion is present no groin pain with internal extra rotation leg no nerve retention signs skin is intact in the right knee region  Vital signs in last 24 hours:    Labs:   Estimated body mass index is 28.78 kg/(m^2) as calculated from the following:   Height as of 11/06/14: 5\' 6"  (1.676 m).   Weight as of 11/06/14: 80.831 kg (178 lb 3.2 oz).   Imaging Review Plain radiographs demonstrate severe degenerative joint disease of the right knee(s). The overall alignment isneutral. The bone quality appears to be good for age and reported activity level.  Assessment/Plan:  End stage arthritis, right knee   The patient history, physical examination, clinical judgment of the provider and imaging studies are consistent with end stage  degenerative joint disease of the right knee(s) and total knee arthroplasty is deemed medically necessary. The treatment options including medical management, injection therapy arthroscopy and arthroplasty were discussed at length. The risks and benefits of total knee arthroplasty were presented and reviewed. The risks due to aseptic loosening, infection, stiffness, patella tracking problems, thromboembolic complications and other imponderables were discussed. The patient acknowledged the explanation, agreed to proceed with the plan and consent was signed. Patient is being admitted for inpatient treatment for surgery, pain control, PT, OT, prophylactic antibiotics, VTE prophylaxis, progressive ambulation and ADL's and discharge planning. The patient is planning to be discharged home with home health services patient has predominantly patellofemoral arthritis but also has degenerative changes in the medial compartment of the knee. If possible will try to use press-fit knee replacement. Patient understands risk benefits and wishes to proceed. No family history of DVT or pulmonary embolism

## 2014-12-15 ENCOUNTER — Inpatient Hospital Stay (HOSPITAL_COMMUNITY)
Admission: RE | Admit: 2014-12-15 | Discharge: 2014-12-17 | DRG: 470 | Disposition: A | Discharge status: Home Health | Payer: Managed Care, Other (non HMO) | Source: Ambulatory Visit | Attending: Orthopedic Surgery | Admitting: Orthopedic Surgery

## 2014-12-15 ENCOUNTER — Inpatient Hospital Stay (HOSPITAL_COMMUNITY): Payer: Managed Care, Other (non HMO) | Admitting: Emergency Medicine

## 2014-12-15 ENCOUNTER — Inpatient Hospital Stay (HOSPITAL_COMMUNITY): Payer: Managed Care, Other (non HMO) | Admitting: Anesthesiology

## 2014-12-15 ENCOUNTER — Encounter (HOSPITAL_COMMUNITY): Payer: Self-pay | Admitting: Anesthesiology

## 2014-12-15 ENCOUNTER — Encounter (HOSPITAL_COMMUNITY)
Admission: RE | Disposition: A | Discharge status: Home Health | Payer: Self-pay | Source: Ambulatory Visit | Attending: Orthopedic Surgery

## 2014-12-15 DIAGNOSIS — M545 Low back pain: Secondary | ICD-10-CM | POA: Diagnosis present

## 2014-12-15 DIAGNOSIS — K219 Gastro-esophageal reflux disease without esophagitis: Secondary | ICD-10-CM | POA: Diagnosis present

## 2014-12-15 DIAGNOSIS — F17200 Nicotine dependence, unspecified, uncomplicated: Secondary | ICD-10-CM | POA: Diagnosis present

## 2014-12-15 DIAGNOSIS — M1711 Unilateral primary osteoarthritis, right knee: Secondary | ICD-10-CM | POA: Diagnosis present

## 2014-12-15 DIAGNOSIS — F419 Anxiety disorder, unspecified: Secondary | ICD-10-CM | POA: Diagnosis present

## 2014-12-15 DIAGNOSIS — G8929 Other chronic pain: Secondary | ICD-10-CM | POA: Diagnosis present

## 2014-12-15 DIAGNOSIS — M179 Osteoarthritis of knee, unspecified: Secondary | ICD-10-CM | POA: Diagnosis present

## 2014-12-15 DIAGNOSIS — M1710 Unilateral primary osteoarthritis, unspecified knee: Secondary | ICD-10-CM | POA: Diagnosis present

## 2014-12-15 DIAGNOSIS — M25561 Pain in right knee: Secondary | ICD-10-CM | POA: Diagnosis present

## 2014-12-15 HISTORY — PX: TOTAL KNEE ARTHROPLASTY: SHX125

## 2014-12-15 LAB — GLUCOSE, CAPILLARY: Glucose-Capillary: 87 mg/dL (ref 65–99)

## 2014-12-15 SURGERY — ARTHROPLASTY, KNEE, TOTAL
Anesthesia: Regional | Site: Knee | Laterality: Right

## 2014-12-15 MED ORDER — NEOSTIGMINE METHYLSULFATE 10 MG/10ML IV SOLN
INTRAVENOUS | Status: AC
  Filled 2014-12-15: qty 1

## 2014-12-15 MED ORDER — BUPIVACAINE LIPOSOME 1.3 % IJ SUSP
20.0000 mL | INTRAMUSCULAR | Status: DC
  Filled 2014-12-15: qty 20

## 2014-12-15 MED ORDER — ROCURONIUM BROMIDE 100 MG/10ML IV SOLN
INTRAVENOUS | Status: DC | PRN
  Administered 2014-12-15: 10 mg via INTRAVENOUS
  Administered 2014-12-15: 20 mg via INTRAVENOUS

## 2014-12-15 MED ORDER — CLONIDINE HCL (ANALGESIA) 100 MCG/ML EP SOLN
150.0000 ug | EPIDURAL | Status: DC
  Filled 2014-12-15: qty 1.5

## 2014-12-15 MED ORDER — HYDROMORPHONE HCL 1 MG/ML IJ SOLN
INTRAMUSCULAR | Status: AC
  Filled 2014-12-15: qty 2

## 2014-12-15 MED ORDER — LACTATED RINGERS IV SOLN: INTRAVENOUS | Status: DC

## 2014-12-15 MED ORDER — LACTATED RINGERS IV SOLN
INTRAVENOUS | Status: DC
  Administered 2014-12-15 (×3): via INTRAVENOUS

## 2014-12-15 MED ORDER — SUCCINYLCHOLINE CHLORIDE 20 MG/ML IJ SOLN
INTRAMUSCULAR | Status: DC | PRN
  Administered 2014-12-15: 80 mg via INTRAVENOUS

## 2014-12-15 MED ORDER — BUPIVACAINE LIPOSOME 1.3 % IJ SUSP
INTRAMUSCULAR | Status: DC | PRN
  Administered 2014-12-15: 20 mL

## 2014-12-15 MED ORDER — OXYCODONE HCL 5 MG PO TABS
ORAL_TABLET | ORAL | Status: AC
Start: 2014-12-15 — End: 2014-12-16
  Filled 2014-12-15: qty 2

## 2014-12-15 MED ORDER — PHENOL 1.4 % MT LIQD: 1.0000 | OROMUCOSAL | Status: DC | PRN

## 2014-12-15 MED ORDER — BUPIVACAINE-EPINEPHRINE (PF) 0.25% -1:200000 IJ SOLN
INTRAMUSCULAR | Status: AC
  Filled 2014-12-15: qty 30

## 2014-12-15 MED ORDER — HYDROMORPHONE HCL 1 MG/ML IJ SOLN
0.2500 mg | INTRAMUSCULAR | Status: DC | PRN
  Administered 2014-12-15 (×5): 0.5 mg via INTRAVENOUS

## 2014-12-15 MED ORDER — CHLORHEXIDINE GLUCONATE 4 % EX LIQD: 60.0000 mL | Freq: Once | CUTANEOUS | Status: DC

## 2014-12-15 MED ORDER — ROCURONIUM BROMIDE 50 MG/5ML IV SOLN
INTRAVENOUS | Status: AC
  Filled 2014-12-15: qty 1

## 2014-12-15 MED ORDER — EPHEDRINE SULFATE 50 MG/ML IJ SOLN
INTRAMUSCULAR | Status: AC
  Filled 2014-12-15: qty 1

## 2014-12-15 MED ORDER — CLINDAMYCIN PHOSPHATE 900 MG/50ML IV SOLN
INTRAVENOUS | Status: AC
  Administered 2014-12-15: 900 mg via INTRAVENOUS
  Filled 2014-12-15: qty 50

## 2014-12-15 MED ORDER — METHOCARBAMOL 500 MG PO TABS
500.0000 mg | ORAL_TABLET | Freq: Four times a day (QID) | ORAL | Status: DC | PRN
  Administered 2014-12-16 – 2014-12-17 (×6): 500 mg via ORAL
  Filled 2014-12-15 (×8): qty 1

## 2014-12-15 MED ORDER — DOCUSATE SODIUM 100 MG PO CAPS
100.0000 mg | ORAL_CAPSULE | Freq: Two times a day (BID) | ORAL | Status: DC
  Administered 2014-12-15 – 2014-12-17 (×4): 100 mg via ORAL
  Filled 2014-12-15 (×4): qty 1

## 2014-12-15 MED ORDER — TRANEXAMIC ACID 1000 MG/10ML IV SOLN
2000.0000 mg | INTRAVENOUS | Status: DC
  Filled 2014-12-15: qty 20

## 2014-12-15 MED ORDER — CLINDAMYCIN PHOSPHATE 900 MG/50ML IV SOLN: 900.0000 mg | INTRAVENOUS | Status: DC

## 2014-12-15 MED ORDER — MORPHINE SULFATE (PF) 4 MG/ML IV SOLN
INTRAVENOUS | Status: AC
  Filled 2014-12-15: qty 2

## 2014-12-15 MED ORDER — CLINDAMYCIN PHOSPHATE 600 MG/50ML IV SOLN
600.0000 mg | Freq: Four times a day (QID) | INTRAVENOUS | Status: AC
  Administered 2014-12-15 (×2): 600 mg via INTRAVENOUS
  Filled 2014-12-15 (×2): qty 50

## 2014-12-15 MED ORDER — SUCCINYLCHOLINE CHLORIDE 20 MG/ML IJ SOLN
INTRAMUSCULAR | Status: AC
  Filled 2014-12-15: qty 1

## 2014-12-15 MED ORDER — GLYCOPYRROLATE 0.2 MG/ML IJ SOLN
INTRAMUSCULAR | Status: AC
  Filled 2014-12-15: qty 3

## 2014-12-15 MED ORDER — OXYCODONE HCL 5 MG PO TABS
5.0000 mg | ORAL_TABLET | ORAL | Status: DC | PRN
  Administered 2014-12-15 – 2014-12-17 (×13): 10 mg via ORAL
  Filled 2014-12-15 (×12): qty 2

## 2014-12-15 MED ORDER — FAMOTIDINE 20 MG PO TABS
10.0000 mg | ORAL_TABLET | Freq: Every day | ORAL | Status: DC
  Administered 2014-12-16: 10 mg via ORAL
  Administered 2014-12-17: 09:00:00 via ORAL
  Filled 2014-12-15 (×2): qty 1

## 2014-12-15 MED ORDER — NEOSTIGMINE METHYLSULFATE 10 MG/10ML IV SOLN
INTRAVENOUS | Status: DC | PRN
  Administered 2014-12-15: 3 mg via INTRAVENOUS

## 2014-12-15 MED ORDER — METOCLOPRAMIDE HCL 5 MG PO TABS: 5.0000 mg | ORAL_TABLET | Freq: Three times a day (TID) | ORAL | Status: DC | PRN

## 2014-12-15 MED ORDER — METHOCARBAMOL 1000 MG/10ML IJ SOLN
500.0000 mg | Freq: Four times a day (QID) | INTRAVENOUS | Status: DC | PRN
  Administered 2014-12-15: 500 mg via INTRAVENOUS
  Filled 2014-12-15 (×2): qty 5

## 2014-12-15 MED ORDER — RIVAROXABAN 10 MG PO TABS
10.0000 mg | ORAL_TABLET | Freq: Every day | ORAL | Status: DC
  Administered 2014-12-16 – 2014-12-17 (×2): 10 mg via ORAL
  Filled 2014-12-15 (×2): qty 1

## 2014-12-15 MED ORDER — 0.9 % SODIUM CHLORIDE (POUR BTL) OPTIME
TOPICAL | Status: DC | PRN
  Administered 2014-12-15: 1000 mL

## 2014-12-15 MED ORDER — METOCLOPRAMIDE HCL 5 MG/ML IJ SOLN: 5.0000 mg | Freq: Three times a day (TID) | INTRAMUSCULAR | Status: DC | PRN

## 2014-12-15 MED ORDER — FENTANYL CITRATE (PF) 100 MCG/2ML IJ SOLN
INTRAMUSCULAR | Status: AC
  Administered 2014-12-15: 100 ug
  Filled 2014-12-15: qty 2

## 2014-12-15 MED ORDER — HYDROMORPHONE HCL 1 MG/ML IJ SOLN
0.2500 mg | INTRAMUSCULAR | Status: DC | PRN
  Administered 2014-12-15 (×3): 0.5 mg via INTRAVENOUS

## 2014-12-15 MED ORDER — POTASSIUM CHLORIDE IN NACL 20-0.9 MEQ/L-% IV SOLN
INTRAVENOUS | Status: AC
  Administered 2014-12-15: 22:00:00 via INTRAVENOUS
  Filled 2014-12-15: qty 1000

## 2014-12-15 MED ORDER — FENTANYL CITRATE (PF) 250 MCG/5ML IJ SOLN
INTRAMUSCULAR | Status: AC
  Filled 2014-12-15: qty 5

## 2014-12-15 MED ORDER — DULOXETINE HCL 30 MG PO CPEP
30.0000 mg | ORAL_CAPSULE | Freq: Every day | ORAL | Status: DC
  Administered 2014-12-16 – 2014-12-17 (×2): 30 mg via ORAL
  Filled 2014-12-15 (×2): qty 1

## 2014-12-15 MED ORDER — ONDANSETRON HCL 4 MG/2ML IJ SOLN
INTRAMUSCULAR | Status: AC
  Filled 2014-12-15: qty 2

## 2014-12-15 MED ORDER — PROPOFOL 10 MG/ML IV BOLUS
INTRAVENOUS | Status: DC | PRN
  Administered 2014-12-15: 200 mg via INTRAVENOUS

## 2014-12-15 MED ORDER — TRANEXAMIC ACID 1000 MG/10ML IV SOLN
2000.0000 mg | INTRAVENOUS | Status: DC | PRN
  Administered 2014-12-15: 2000 mg via TOPICAL

## 2014-12-15 MED ORDER — SODIUM CHLORIDE 0.9 % IJ SOLN
INTRAMUSCULAR | Status: DC | PRN
  Administered 2014-12-15: 40 mL

## 2014-12-15 MED ORDER — SODIUM CHLORIDE 0.9 % IJ SOLN
INTRAMUSCULAR | Status: AC
  Filled 2014-12-15: qty 10

## 2014-12-15 MED ORDER — ONDANSETRON HCL 4 MG PO TABS: 4.0000 mg | ORAL_TABLET | Freq: Four times a day (QID) | ORAL | Status: DC | PRN

## 2014-12-15 MED ORDER — SODIUM CHLORIDE 0.9 % IR SOLN
Status: DC | PRN
  Administered 2014-12-15: 3000 mL

## 2014-12-15 MED ORDER — ACETAMINOPHEN 650 MG RE SUPP: 650.0000 mg | Freq: Four times a day (QID) | RECTAL | Status: DC | PRN

## 2014-12-15 MED ORDER — BUPIVACAINE-EPINEPHRINE 0.25% -1:200000 IJ SOLN
INTRAMUSCULAR | Status: DC | PRN
  Administered 2014-12-15: 10 mL

## 2014-12-15 MED ORDER — PROPOFOL 10 MG/ML IV BOLUS
INTRAVENOUS | Status: AC
  Filled 2014-12-15: qty 20

## 2014-12-15 MED ORDER — FENTANYL CITRATE (PF) 100 MCG/2ML IJ SOLN
INTRAMUSCULAR | Status: DC | PRN
  Administered 2014-12-15 (×3): 50 ug via INTRAVENOUS
  Administered 2014-12-15: 100 ug via INTRAVENOUS

## 2014-12-15 MED ORDER — MIDAZOLAM HCL 2 MG/2ML IJ SOLN
INTRAMUSCULAR | Status: AC
  Filled 2014-12-15: qty 2

## 2014-12-15 MED ORDER — MORPHINE SULFATE (PF) 4 MG/ML IV SOLN
INTRAVENOUS | Status: DC | PRN
  Administered 2014-12-15: 2 mg

## 2014-12-15 MED ORDER — ONDANSETRON HCL 4 MG/2ML IJ SOLN: 4.0000 mg | Freq: Once | INTRAMUSCULAR | Status: DC | PRN

## 2014-12-15 MED ORDER — GLYCOPYRROLATE 0.2 MG/ML IJ SOLN
INTRAMUSCULAR | Status: DC | PRN
  Administered 2014-12-15: 0.4 mg via INTRAVENOUS

## 2014-12-15 MED ORDER — MENTHOL 3 MG MT LOZG: 1.0000 | LOZENGE | OROMUCOSAL | Status: DC | PRN

## 2014-12-15 MED ORDER — LIDOCAINE HCL (CARDIAC) 20 MG/ML IV SOLN
INTRAVENOUS | Status: DC | PRN
  Administered 2014-12-15: 65 mg via INTRAVENOUS
  Administered 2014-12-15: 35 mg via INTRAVENOUS

## 2014-12-15 MED ORDER — GABAPENTIN 300 MG PO CAPS
300.0000 mg | ORAL_CAPSULE | Freq: Three times a day (TID) | ORAL | Status: DC
  Administered 2014-12-15 – 2014-12-17 (×6): 300 mg via ORAL
  Filled 2014-12-15 (×6): qty 1

## 2014-12-15 MED ORDER — ONDANSETRON HCL 4 MG/2ML IJ SOLN
INTRAMUSCULAR | Status: DC | PRN
  Administered 2014-12-15: 4 mg via INTRAVENOUS

## 2014-12-15 MED ORDER — LIDOCAINE HCL (CARDIAC) 20 MG/ML IV SOLN
INTRAVENOUS | Status: AC
  Filled 2014-12-15: qty 5

## 2014-12-15 MED ORDER — ONDANSETRON HCL 4 MG/2ML IJ SOLN: 4.0000 mg | Freq: Four times a day (QID) | INTRAMUSCULAR | Status: DC | PRN

## 2014-12-15 MED ORDER — LIDOCAINE HCL 4 % MT SOLN
OROMUCOSAL | Status: DC | PRN
  Administered 2014-12-15: 4 mL via TOPICAL

## 2014-12-15 MED ORDER — MIDAZOLAM HCL 2 MG/2ML IJ SOLN
INTRAMUSCULAR | Status: DC
Start: 2014-12-15 — End: 2014-12-15
  Filled 2014-12-15: qty 2

## 2014-12-15 MED ORDER — CLONIDINE HCL (ANALGESIA) 100 MCG/ML EP SOLN
EPIDURAL | Status: DC | PRN
  Administered 2014-12-15: 1 mL via INTRA_ARTICULAR

## 2014-12-15 MED ORDER — HYDROMORPHONE HCL 1 MG/ML IJ SOLN
1.0000 mg | INTRAMUSCULAR | Status: DC | PRN
  Administered 2014-12-15 – 2014-12-17 (×9): 1 mg via INTRAVENOUS
  Filled 2014-12-15 (×10): qty 1

## 2014-12-15 MED ORDER — ACETAMINOPHEN 325 MG PO TABS: 650.0000 mg | ORAL_TABLET | Freq: Four times a day (QID) | ORAL | Status: DC | PRN

## 2014-12-15 SURGICAL SUPPLY — 73 items
BANDAGE ELASTIC 4 VELCRO ST LF (GAUZE/BANDAGES/DRESSINGS) ×2 IMPLANT
BANDAGE ELASTIC 6 VELCRO ST LF (GAUZE/BANDAGES/DRESSINGS) ×2 IMPLANT
BANDAGE ESMARK 6X9 LF (GAUZE/BANDAGES/DRESSINGS) ×1 IMPLANT
BLADE SAG 18X100X1.27 (BLADE) ×2 IMPLANT
BLADE SAW SGTL 13.0X1.19X90.0M (BLADE) IMPLANT
BNDG COHESIVE 6X5 TAN STRL LF (GAUZE/BANDAGES/DRESSINGS) ×2 IMPLANT
BNDG ELASTIC 6X10 VLCR STRL LF (GAUZE/BANDAGES/DRESSINGS) ×6 IMPLANT
BNDG ESMARK 6X9 LF (GAUZE/BANDAGES/DRESSINGS) ×2
BOWL SMART MIX CTS (DISPOSABLE) IMPLANT
CAPT KNEE TRIATH TK-4 ×2 IMPLANT
CLSR STERI-STRIP ANTIMIC 1/2X4 (GAUZE/BANDAGES/DRESSINGS) ×4 IMPLANT
COVER SURGICAL LIGHT HANDLE (MISCELLANEOUS) ×2 IMPLANT
CUFF TOURNIQUET SINGLE 34IN LL (TOURNIQUET CUFF) ×2 IMPLANT
DECANTER SPIKE VIAL GLASS SM (MISCELLANEOUS) ×2 IMPLANT
DRAPE INCISE IOBAN 66X45 STRL (DRAPES) IMPLANT
DRAPE ORTHO SPLIT 77X108 STRL (DRAPES) ×3
DRAPE SURG ORHT 6 SPLT 77X108 (DRAPES) ×3 IMPLANT
DRAPE U-SHAPE 47X51 STRL (DRAPES) ×2 IMPLANT
DRSG MEPILEX BORDER 4X12 (GAUZE/BANDAGES/DRESSINGS) ×2 IMPLANT
DRSG MEPILEX BORDER 4X8 (GAUZE/BANDAGES/DRESSINGS) ×2 IMPLANT
DRSG PAD ABDOMINAL 8X10 ST (GAUZE/BANDAGES/DRESSINGS) ×2 IMPLANT
DURAPREP 26ML APPLICATOR (WOUND CARE) ×2 IMPLANT
ELECT REM PT RETURN 9FT ADLT (ELECTROSURGICAL) ×2
ELECTRODE REM PT RTRN 9FT ADLT (ELECTROSURGICAL) ×1 IMPLANT
FACESHIELD WRAPAROUND (MASK) ×2 IMPLANT
GAUZE SPONGE 4X4 12PLY STRL (GAUZE/BANDAGES/DRESSINGS) ×2 IMPLANT
GAUZE XEROFORM 1X8 LF (GAUZE/BANDAGES/DRESSINGS) ×2 IMPLANT
GAUZE XEROFORM 5X9 LF (GAUZE/BANDAGES/DRESSINGS) ×2 IMPLANT
GLOVE BIOGEL PI IND STRL 7.5 (GLOVE) ×1 IMPLANT
GLOVE BIOGEL PI IND STRL 8 (GLOVE) ×2 IMPLANT
GLOVE BIOGEL PI INDICATOR 7.5 (GLOVE) ×1
GLOVE BIOGEL PI INDICATOR 8 (GLOVE) ×2
GLOVE ECLIPSE 7.0 STRL STRAW (GLOVE) ×4 IMPLANT
GLOVE SURG ORTHO 8.0 STRL STRW (GLOVE) ×4 IMPLANT
GOWN STRL REUS W/ TWL LRG LVL3 (GOWN DISPOSABLE) ×3 IMPLANT
GOWN STRL REUS W/TWL 2XL LVL3 (GOWN DISPOSABLE) ×2 IMPLANT
GOWN STRL REUS W/TWL LRG LVL3 (GOWN DISPOSABLE) ×3
HANDPIECE INTERPULSE COAX TIP (DISPOSABLE)
HOOD PEEL AWAY FACE SHEILD DIS (HOOD) ×6 IMPLANT
IMMOBILIZER KNEE 20 (SOFTGOODS) IMPLANT
IMMOBILIZER KNEE 22 UNIV (SOFTGOODS) ×2 IMPLANT
KIT BASIN OR (CUSTOM PROCEDURE TRAY) ×2 IMPLANT
KIT ROOM TURNOVER OR (KITS) ×2 IMPLANT
MANIFOLD NEPTUNE II (INSTRUMENTS) ×2 IMPLANT
NDL SAFETY ECLIPSE 18X1.5 (NEEDLE) ×1 IMPLANT
NEEDLE 18GX1X1/2 (RX/OR ONLY) (NEEDLE) ×2 IMPLANT
NEEDLE HYPO 18GX1.5 SHARP (NEEDLE) ×1
NEEDLE HYPO 22GX1.5 SAFETY (NEEDLE) ×4 IMPLANT
NEEDLE SPNL 18GX3.5 QUINCKE PK (NEEDLE) ×2 IMPLANT
NS IRRIG 1000ML POUR BTL (IV SOLUTION) ×4 IMPLANT
PACK TOTAL JOINT (CUSTOM PROCEDURE TRAY) ×2 IMPLANT
PACK UNIVERSAL I (CUSTOM PROCEDURE TRAY) ×2 IMPLANT
PAD ARMBOARD 7.5X6 YLW CONV (MISCELLANEOUS) ×4 IMPLANT
PAD CAST 4YDX4 CTTN HI CHSV (CAST SUPPLIES) ×1 IMPLANT
PADDING CAST COTTON 4X4 STRL (CAST SUPPLIES) ×1
PADDING CAST COTTON 6X4 STRL (CAST SUPPLIES) ×2 IMPLANT
SET HNDPC FAN SPRY TIP SCT (DISPOSABLE) IMPLANT
SPONGE GAUZE 4X4 12PLY STER LF (GAUZE/BANDAGES/DRESSINGS) ×2 IMPLANT
SPONGE LAP 18X18 X RAY DECT (DISPOSABLE) IMPLANT
STRIP CLOSURE SKIN 1/2X4 (GAUZE/BANDAGES/DRESSINGS) ×4 IMPLANT
SUCTION FRAZIER TIP 10 FR DISP (SUCTIONS) ×2 IMPLANT
SUT MNCRL AB 3-0 PS2 18 (SUTURE) ×2 IMPLANT
SUT VIC AB 0 CT1 27 (SUTURE) ×3
SUT VIC AB 0 CT1 27XBRD ANBCTR (SUTURE) ×3 IMPLANT
SUT VIC AB 1 CT1 27 (SUTURE) ×5
SUT VIC AB 1 CT1 27XBRD ANBCTR (SUTURE) ×5 IMPLANT
SUT VIC AB 2-0 CT1 27 (SUTURE) ×2
SUT VIC AB 2-0 CT1 TAPERPNT 27 (SUTURE) ×2 IMPLANT
SYR 30ML LL (SYRINGE) ×6 IMPLANT
SYR TB 1ML LUER SLIP (SYRINGE) ×2 IMPLANT
TOWEL OR 17X24 6PK STRL BLUE (TOWEL DISPOSABLE) ×4 IMPLANT
TOWEL OR 17X26 10 PK STRL BLUE (TOWEL DISPOSABLE) ×4 IMPLANT
WATER STERILE IRR 1000ML POUR (IV SOLUTION) ×4 IMPLANT

## 2014-12-15 NOTE — Progress Notes (Signed)
Orthopedic Tech Progress Note Patient Details:  [name] 12-17-1970 [id number]  Patient ID: [name], female   DOB: 12-17-1970, 44 y.o.   MRN: [id number]   [name], Argelia Formisano Craig 12/15/2014, 7:13 PM On cpm at 7:10 pm

## 2014-12-15 NOTE — Interval H&P Note (Signed)
History and Physical Interval Note:  12/15/2014 7:25 AM  [name]  has presented today for surgery, with the diagnosis of RIGHT KNEE OSTEOARTHRITIS  The various methods of treatment have been discussed with the patient and family. After consideration of risks, benefits and other options for treatment, the patient has consented to  Procedure(s): TOTAL KNEE ARTHROPLASTY (Right) as a surgical intervention .  The patient's history has been reviewed, patient examined, no change in status, stable for surgery.  I have reviewed the patient's chart and labs.  Questions were answered to the patient's satisfaction.     [name]

## 2014-12-15 NOTE — Anesthesia Preprocedure Evaluation (Addendum)
Anesthesia Evaluation  Patient identified by MRN, date of birth, ID band Patient awake    Reviewed: Allergy & Precautions, NPO status , Patient's Chart, lab work & pertinent test results  History of Anesthesia Complications (+) Family history of anesthesia reaction  Airway Mallampati: I  TM Distance: >3 FB     Dental  (+) Teeth Intact   Pulmonary Current Smoker,    Pulmonary exam normal        Cardiovascular Normal cardiovascular exam     Neuro/Psych  Headaches, PSYCHIATRIC DISORDERS Anxiety    GI/Hepatic Neg liver ROS, GERD  Medicated and Controlled,  Endo/Other  negative endocrine ROS  Renal/GU negative Renal ROS     Musculoskeletal  (+) Arthritis ,   Abdominal   Peds  Hematology negative hematology ROS (+)   Anesthesia Other Findings   Reproductive/Obstetrics                       Anesthesia Physical Anesthesia Plan  ASA: I  Anesthesia Plan: General and Regional   Post-op Pain Management: GA combined w/ Regional for post-op pain   Induction: Intravenous  Airway Management Planned: Oral ETT  Additional Equipment:   Intra-op Plan:   Post-operative Plan: Extubation in OR  Informed Consent: I have reviewed the patients History and Physical, chart, labs and discussed the procedure including the risks, benefits and alternatives for the proposed anesthesia with the patient or authorized representative who has indicated his/her understanding and acceptance.   Dental advisory given  Plan Discussed with: Anesthesiologist, CRNA and Surgeon  Anesthesia Plan Comments:        Anesthesia Quick Evaluation

## 2014-12-15 NOTE — Progress Notes (Signed)
Orthopedic Tech Progress Note Patient Details:  [name] 03-15-1970 [id number]  CPM Right Knee CPM Right Knee: On Right Knee Flexion (Degrees): 40 Right Knee Extension (Degrees): 0 Additional Comments: applied ohf   Ortho Devices Ortho Device/Splint Location: footsie roll Ortho Device/Splint Interventions: Ordered   [name], Beverley Allender Craig 12/15/2014, 3:50 PM

## 2014-12-15 NOTE — Anesthesia Postprocedure Evaluation (Signed)
  Anesthesia Post-op Note  Patient: [name]  Procedure(s) Performed: Procedure(s): TOTAL KNEE ARTHROPLASTY (Right)  Patient Location: PACU  Anesthesia Type:General and GA combined with regional for post-op pain  Level of Consciousness: awake, alert , oriented and patient cooperative  Airway and Oxygen Therapy: Patient Spontanous Breathing  Post-op Pain: mild  Post-op Assessment: Post-op Vital signs reviewed, Patient's Cardiovascular Status Stable, Respiratory Function Stable, Patent Airway, No signs of Nausea or vomiting and Pain level controlled              Post-op Vital Signs: stable  Last Vitals:  Filed Vitals:   12/15/14 1500  BP:   Pulse: 91  Temp:   Resp: 17    Complications: No apparent anesthesia complications

## 2014-12-15 NOTE — Evaluation (Signed)
Physical Therapy Evaluation Patient Details Name: [name] MRN: [id number] DOB: 05/29/1970 Today's Date: 12/15/2014   History of Present Illness  44 y.o. female with hx of anxiety, seen s/p right total knee arthroplasty.  Clinical Impression  Pt is s/p right TKA resulting in the deficits listed below (see PT Problem List). Tolerated short distance gait training up to 20 feet post op day #0, demonstrating good knee stability in stance phase. Reviewed several exercises and positioning of RLE to promote optimal healing (use of zero knee.) Has good family support. Will need to safely navigate approximately 3 steps to enter home. Pt will benefit from skilled PT to increase their independence and safety with mobility to allow discharge to the venue listed below.      Follow Up Recommendations Home health PT;Supervision for mobility/OOB    Equipment Recommendations  None recommended by PT    Recommendations for Other Services OT consult     Precautions / Restrictions Precautions Precautions: Knee Precaution Booklet Issued: Yes (comment) Precaution Comments: reviewed precautions Required Braces or Orthoses: Knee Immobilizer - Right Restrictions Weight Bearing Restrictions: Yes RLE Weight Bearing: Weight bearing as tolerated      Mobility  Bed Mobility Overal bed mobility: Needs Assistance Bed Mobility: Supine to Sit     Supine to sit: Min guard     General bed mobility comments: Min guard for safety. Requires extra time and use of rail. VC for technique  Transfers Overall transfer level: Needs assistance Equipment used: Rolling Lager (2 wheeled) Transfers: Sit to/from Stand Sit to Stand: Min assist         General transfer comment: Min assist for boost to stand from lowest bed setting. VC for hand placement. Minimal WB through RLE initially. Able to perform weight-shift task in standing with moderate use of RW for support.  Ambulation/Gait Ambulation/Gait  assistance: Min assist Ambulation Distance (Feet): 20 Feet Assistive device: Rolling Guse (2 wheeled) Gait Pattern/deviations: Step-to pattern;Decreased step length - left;Decreased stance time - right;Decreased stride length;Antalgic Gait velocity: decreased Gait velocity interpretation: Below normal speed for age/gender General Gait Details: Educated on safe DME use with rolling Oland. Min assist for Cruise placement on several occasions and VC for technique throughout. No overt loss of balance. Cues to prevent stepping too close to front of RW.  Stairs            Wheelchair Mobility    Modified Rankin (Stroke Patients Only)       Balance Overall balance assessment: Needs assistance Sitting-balance support: No upper extremity supported;Feet supported Sitting balance-Leahy Scale: Good     Standing balance support: No upper extremity supported Standing balance-Leahy Scale: Fair                               Pertinent Vitals/Pain Pain Assessment: 0-10 Pain Score: 8  Pain Location: Rt knee Pain Descriptors / Indicators: Aching Pain Intervention(s): Monitored during session;Repositioned;Premedicated before session    Home Living Family/patient expects to be discharged to:: Private residence Living Arrangements: Children;Spouse/significant other Available Help at Discharge: Family;Available 24 hours/day Type of Home: House Home Access: Stairs to enter Entrance Stairs-Rails: None Entrance Stairs-Number of Steps: 2-3 Home Layout: Two level;Able to live on main level with bedroom/bathroom Home Equipment: Gensel - 2 wheels (3 in 1)      Prior Function Level of Independence: Independent               Hand Dominance  Dominant Hand: Right    Extremity/Trunk Assessment   Upper Extremity Assessment: Defer to OT evaluation           Lower Extremity Assessment: RLE deficits/detail RLE Deficits / Details: decreased strength and ROM as  expected post op       Communication   Communication: No difficulties  Cognition Arousal/Alertness: Awake/alert Behavior During Therapy: WFL for tasks assessed/performed Overall Cognitive Status: Within Functional Limits for tasks assessed                      General Comments General comments (skin integrity, edema, etc.): Reviewed precautions    Exercises Total Joint Exercises Ankle Circles/Pumps: AROM;Both;10 reps;Seated Quad Sets: Strengthening;Both;10 reps;Seated Long Arc Quad: Strengthening;5 reps;Seated      Assessment/Plan    PT Assessment Patient needs continued PT services  PT Diagnosis Difficulty walking;Abnormality of gait;Acute pain   PT Problem List Decreased strength;Decreased range of motion;Decreased activity tolerance;Decreased balance;Decreased mobility;Decreased knowledge of use of DME;Decreased knowledge of precautions;Pain  PT Treatment Interventions DME instruction;Gait training;Stair training;Functional mobility training;Therapeutic activities;Therapeutic exercise;Balance training;Neuromuscular re-education;Modalities   PT Goals (Current goals can be found in the Care Plan section) Acute Rehab PT Goals Patient Stated Goal: No pain PT Goal Formulation: With patient Time For Goal Achievement: 12/22/14 Potential to Achieve Goals: Good    Frequency 7X/week   Barriers to discharge        Co-evaluation               End of Session   Activity Tolerance: Patient limited by pain Patient left: in chair;with call bell/phone within reach;with family/visitor present Nurse Communication: Mobility status         Time: 4782-9562 PT Time Calculation (min) (ACUTE ONLY): 19 min   Charges:   PT Evaluation $Initial PT Evaluation Tier I: 1 Procedure     PT G CodesBerton Mount 12/15/2014, 6:20 PM  Charlsie Merles, [place] [phone]

## 2014-12-15 NOTE — Anesthesia Procedure Notes (Addendum)
Anesthesia Regional Block:  Adductor canal block  Pre-Anesthetic Checklist: ,, timeout performed, Correct Patient, Correct Site, Correct Laterality, Correct Procedure, Correct Position, site marked, Risks and benefits discussed,  Surgical consent,  Pre-op evaluation,  At surgeon's request and post-op pain management  Laterality: Right  Prep: chloraprep       Needles:  Injection technique: Single-shot  Needle Type: Stimulator Needle - 80          Additional Needles:  Procedures: Doppler guided Adductor canal block Narrative:  Start time: 12/15/2014 10:40 AM End time: 12/15/2014 10:45 AM Injection made incrementally with aspirations every 5 mL.  Performed by: Personally  Anesthesiologist: Laverle HobbySMITH, GREGORY  Additional Notes: 25 cc 0.5% Marcaine w/ epi w/o difficulty or discomfort. Pt tolerated well. GES   Procedure Name: Intubation Date/Time: 12/15/2014 11:01 AM Performed by: Donette LarryMEYER, Oren Barella E Pre-anesthesia Checklist: Patient identified, Emergency Drugs available, Suction available, Patient being monitored and Timeout performed Patient Re-evaluated:Patient Re-evaluated prior to inductionOxygen Delivery Method: Circle system utilized Preoxygenation: Pre-oxygenation with 100% oxygen Intubation Type: IV induction Ventilation: Mask ventilation without difficulty Laryngoscope Size: Miller and 2 Grade View: Grade I Tube type: Oral Tube size: 7.5 mm Number of attempts: 1 Airway Equipment and Method: Stylet Placement Confirmation: ETT inserted through vocal cords under direct vision,  positive ETCO2 and CO2 detector Secured at: 21 cm Tube secured with: Tape Dental Injury: Teeth and Oropharynx as per pre-operative assessment     Performed by: Fransisca KaufmannMEYER, Denni France E

## 2014-12-15 NOTE — Brief Op Note (Signed)
12/15/2014  1:26 PM  PATIENT:  [name]  44 y.o. female  PRE-OPERATIVE DIAGNOSIS:  RIGHT KNEE OSTEOARTHRITIS  POST-OPERATIVE DIAGNOSIS:  RIGHT KNEE OSTEOARTHRITIS  PROCEDURE:  Procedure(s): TOTAL KNEE ARTHROPLASTY  SURGEON:  Surgeon(s): Cammy CopaScott Malin Sambrano, MD  ASSISTANT: Patrick Jupiterarla Bethune RNFA  ANESTHESIA:   general  EBL: 100 ml    Total I/O In: 1000 [I.V.:1000] Out: 100 [Blood:100]  BLOOD ADMINISTERED: none  DRAINS: none   LOCAL MEDICATIONS USED:  none  SPECIMEN:  No Specimen  COUNTS:  YES  TOURNIQUET:   Total Tourniquet Time Documented: Thigh (Right) - 96 minutes Total: Thigh (Right) - 96 minutes   DICTATION: .Other Dictation: Dictation Number [id number]  PLAN OF CARE: Admit to inpatient   PATIENT DISPOSITION:  PACU - hemodynamically stable

## 2014-12-15 NOTE — Transfer of Care (Signed)
Immediate Anesthesia Transfer of Care Note  Patient: [name]  Procedure(s) Performed: Procedure(s): TOTAL KNEE ARTHROPLASTY (Right)  Patient Location: PACU  Anesthesia Type:General and Regional  Level of Consciousness: awake, alert , oriented and sedated  Airway & Oxygen Therapy: Patient Spontanous Breathing and Patient connected to nasal cannula oxygen  Post-op Assessment: Report given to RN, Post -op Vital signs reviewed and stable and Patient moving all extremities  Post vital signs: Reviewed and stable  Last Vitals:  Filed Vitals:   12/15/14 1044  BP: 164/90  Pulse:   Temp:   Resp:     Complications: No apparent anesthesia complications

## 2014-12-15 NOTE — Op Note (Signed)
NAMCory Munch:  [name], Rebakah                ACCOUNT NO.:  [id number]  MEDICAL RECORD NO.:  [id number]  LOCATION:  5N12C                        FACILITY:  [facility]  PHYSICIAN:  Burnard BuntingG. Scott Torria Fromer, M.D.    DATE OF BIRTH:  24-Jun-1970  DATE OF PROCEDURE:  12/15/2014 DATE OF DISCHARGE:                              OPERATIVE REPORT   PREOPERATIVE DIAGNOSIS:  Right knee arthritis.  POSTOPERATIVE DIAGNOSES:  Right knee severe patellofemoral arthritis, and mildly severe medial compartment arthritis.  PROCEDURE:  Right total knee replacement.  SURGEON:  Burnard BuntingG. Scott Adonnis Salceda, M.D.  ASSISTANT:  Patrick Jupiterarla Bethune, RNFA.  INDICATIONS:  [name] is a 44 year old patient with end-stage knee arthritis presents for operative management after explanation of risks and benefits.  COMPONENTS INSERTED:  A Stryker press-fit 3 right femur, 3 tibia, 29-mm offset patella press-fit, PCL cruciate retaining 9-mm spacer.  PROCEDURE IN DETAIL:  The patient was brought to the operating room, where general endotracheal anesthesia was induced.  A procedure time-out was called.  Right leg pre-scrubbed with alcohol and Betadine, allowed to air dry, prepped with DuraPrep solution and draped in sterile manner. Collier Flowersoban was used to cover the operative field.  Leg was elevated and exsanguinated with Esmarch wrap.  Tourniquet was inflated.  Time-out was called.  Anterior approach to the knee was made.  Skin and subcutaneous tissues were sharply divided.  Median parapatellar approach was made, marked with #1 Vicryl suture.  Minimal dissection performed on the medial aspect on relatively neutrally aligned knee.  Soft tissue removed from the anterior distal femur.  Lateral patellofemoral ligament released.  Intramedullary alignment used in the tibia.  ACL was released.  The perpendicular mechanical axis of the tibia was made total resection 4 mm off the most affected lowest point on the medial tibial plateau.  An 8-mm cut was then made off the  distal femur using the intramedullary alignment.  The chamfer cuts were then made after sizing the femur to a size 3.  At this time, the patient's bone quality was excellent.  Decision was made to proceed with press-fit knee replacement.  The tibia was then keel punched and prepared.  The patella was cut down to a size 12 mm and the holes drilled for 3 peg patella press-fit.  At this time, thorough irrigation was performed.  Exparel injected into the knee.  The tibia, then femur, then patella, then tibial spacer was placed with excellent stability to varus valgus stress at 0, 30, and 90 degrees, achieved excellent patellar tracking with no thumbs technique.  Good anterior-posterior stability.  At this time, tourniquet was released.  Bleeding points where encountered were controlled using electrocautery.  It should be noted that we did use a tranexamic acid for about 3 minutes prior to placing the components. Thorough irrigation again performed.  The median parapatellar arthrotomy closed over bolster using #1 Vicryl suture, 0 Vicryl suture, 2-0 Vicryl suture, and a 3-0 Monocryl.  Incision was then dressed with Mepilex dressing followed by bulky Jones and knee immobilizer.  The patient tolerated the procedure well without immediate complication.     Burnard BuntingG. Scott Ronelle Michie, M.D.     GSD/MEDQ  D:  12/15/2014  T:  12/15/2014  Job:  [id number]

## 2014-12-16 ENCOUNTER — Encounter (HOSPITAL_COMMUNITY): Payer: Self-pay | Admitting: Orthopedic Surgery

## 2014-12-16 LAB — BASIC METABOLIC PANEL
Anion gap: 7 (ref 5–15)
BUN: 7 mg/dL (ref 6–20)
CO2: 29 mmol/L (ref 22–32)
Calcium: 8.6 mg/dL — ABNORMAL LOW (ref 8.9–10.3)
Chloride: 102 mmol/L (ref 101–111)
Creatinine, Ser: 0.7 mg/dL (ref 0.44–1.00)
GFR calc Af Amer: >60 mL/min (ref >60)
GFR calc non Af Amer: >60 mL/min (ref >60)
Glucose, Bld: 153 mg/dL — ABNORMAL HIGH (ref 65–99)
Potassium: 4.3 mmol/L (ref 3.5–5.1)
Sodium: 138 mmol/L (ref 135–145)

## 2014-12-16 LAB — CBC
HCT: 36.8 % (ref 36.0–46.0)
Hemoglobin: 11.8 g/dL — ABNORMAL LOW (ref 12.0–15.0)
MCH: 28.6 pg (ref 26.0–34.0)
MCHC: 32.1 g/dL (ref 30.0–36.0)
MCV: 89.1 fL (ref 78.0–100.0)
Platelets: 121 10*3/uL — ABNORMAL LOW (ref 150–400)
RBC: 4.13 MIL/uL (ref 3.87–5.11)
RDW: 13.3 % (ref 11.5–15.5)
WBC: 11.4 10*3/uL — ABNORMAL HIGH (ref 4.0–10.5)

## 2014-12-16 MED ORDER — PNEUMOCOCCAL VAC POLYVALENT 25 MCG/0.5ML IJ INJ: 0.5000 mL | INJECTION | INTRAMUSCULAR | Status: DC

## 2014-12-16 NOTE — Progress Notes (Signed)
Physical Therapy Treatment Patient Details Name: Sarah RIGSBYler MRN: [id number] DOB: [birth year] Today's Date: 12/16/2014    History of Present Illness 44 y.o. female with hx of anxiety, seen s/p right total knee arthroplasty.    PT Comments    Able to increase amb distance despite difficulty managing pain throughout the day; We discussed what to expect in the next PT sessions; Painful with therex, but steady on her feet and with amb;   On track for dc home tomorrow   Follow Up Recommendations  Home health PT;Supervision for mobility/OOB     Equipment Recommendations  None recommended by PT    Recommendations for Other Services       Precautions / Restrictions Precautions Precautions: Knee Precaution Booklet Issued: Yes (comment) Precaution Comments: Pt educated to not allow any pillow or bolster under knee for healing with optimal range of motion.  Required Braces or Orthoses: Knee Immobilizer - Right Knee Immobilizer - Right: On when out of bed or walking Restrictions RLE Weight Bearing: Weight bearing as tolerated    Mobility  Bed Mobility Overal bed mobility: Needs Assistance Bed Mobility: Sit to Supine       Sit to supine: Min assist   General bed mobility comments: Husband gave adequate min assist to help RLE into bed  Transfers Overall transfer level: Needs assistance Equipment used: Rolling Speranza (2 wheeled) Transfers: Sit to/from Stand Sit to Stand: Supervision         General transfer comment: Cues for ahnd placement and safety; also to preposition RLE for comfort with the transitions (to avoid pt throwing herself backwards into/onto chair or bed)  Ambulation/Gait Ambulation/Gait assistance: Min guard Ambulation Distance (Feet): 80 Feet Assistive device: Rolling Moye (2 wheeled) Gait Pattern/deviations: Step-to pattern;Decreased stance time - right;Decreased step length - left Gait velocity: decreased   General Gait Details: Noted carryover  of education in RW use provided during PT eval; better body positioning in RW; extremely distracted by pain and/or anticipation of pain with therex   Stairs            Wheelchair Mobility    Modified Rankin (Stroke Patients Only)       Balance             Standing balance-Leahy Scale: Fair                      Cognition Arousal/Alertness: Awake/alert Behavior During Therapy: WFL for tasks assessed/performed Overall Cognitive Status: Within Functional Limits for tasks assessed                      Exercises Total Joint Exercises Ankle Circles/Pumps: AROM;Both;10 reps Quad Sets: Strengthening;Both;10 reps Heel Slides: AAROM;Right;10 reps Straight Leg Raises: AAROM;Right;10 reps Goniometric ROM: 7-55    General Comments General comments (skin integrity, edema, etc.): Educated pt's husband in positioning of CPM and how to adjust degree range; Blimy politely declined getting in CPM with me      Pertinent Vitals/Pain Pain Assessment: 0-10 Pain Score: 8  Pain Location: R knee Pain Intervention(s): Limited activity within patient's tolerance    Home Living                      Prior Function            PT Goals (current goals can now be found in the care plan section) Acute Rehab PT Goals Patient Stated Goal: No pain, and hopes to slep tonight PT  Goal Formulation: With patient Time For Goal Achievement: 12/22/14 Potential to Achieve Goals: Good Progress towards PT goals: Progressing toward goals    Frequency  7X/week    PT Plan Current plan remains appropriate    Co-evaluation             End of Session Equipment Utilized During Treatment: Right knee immobilizer Activity Tolerance: Patient limited by pain Patient left: in bed;with call bell/phone within reach;with family/visitor present     Time: 6962-95281509-1551 PT Time Calculation (min) (ACUTE ONLY): 42 min  Charges:  $Gait Training: 8-22 mins $Therapeutic Exercise:  8-22 mins $Therapeutic Activity: 8-22 mins                    G Codes:      Olen PelGarrigan, Nuria Phebus Hamff 12/16/2014, 4:26 PM  Van ClinesHolly Sofia Vanmeter, [place]PT  Acute Rehabilitation Services Pager [phone] Office [phone]

## 2014-12-16 NOTE — Progress Notes (Signed)
Occupational Therapy Evaluation Patient Details Name: [name] MRN: [id number] DOB: [birth year] Today's Date: 12/16/2014    History of Present Illness 44 y.o. female with hx of anxiety, seen s/p right total knee arthroplasty.   Clinical Impression   PTA, pt independent with ADL and mobility. Pt with increased c/o pain this am session, although premedicated prior to session. Nsg notified of pt's request for better pain control. Will plan to see in am to complete education with pt/family on tub transfers and ADL retraining.     Follow Up Recommendations  No OT follow up;Supervision - Intermittent    Equipment Recommendations  None recommended by OT    Recommendations for Other Services       Precautions / Restrictions Precautions Precautions: Knee Precaution Booklet Issued: Yes (comment) Precaution Comments: reviewed precautions Required Braces or Orthoses: Knee Immobilizer - Right Restrictions Weight Bearing Restrictions: Yes RLE Weight Bearing: Weight bearing as tolerated      Mobility Bed Mobility               General bed mobility comments: OOB in chair  Transfers Overall transfer level: Needs assistance Equipment used: Rolling Puccini (2 wheeled)   Sit to Stand: Supervision         General transfer comment: KI used    Balance     Sitting balance-Leahy Scale: Good       Standing balance-Leahy Scale: Fair                              ADL                                               Vision     Perception     Praxis      Pertinent Vitals/Pain Pain Assessment: 0-10 Pain Score: 8  Pain Location: R knee Pain Descriptors / Indicators: Aching;Burning;Constant Pain Intervention(s): Limited activity within patient's tolerance;Repositioned;Relaxation;Ice applied     Hand Dominance Right   Extremity/Trunk Assessment Upper Extremity Assessment Upper Extremity Assessment: Overall WFL for tasks  assessed   Lower Extremity Assessment Lower Extremity Assessment: Defer to PT evaluation RLE Deficits / Details: decreased strength and ROM as expected post op   Cervical / Trunk Assessment Cervical / Trunk Assessment: Normal   Communication Communication Communication: No difficulties   Cognition Arousal/Alertness: Awake/alert Behavior During Therapy: WFL for tasks assessed/performed Overall Cognitive Status: Within Functional Limits for tasks assessed                     General Comments       Exercises Exercises: Other exercises Other Exercises Other Exercises: positioned in zero knee foam. Educated on resoning behind use of zero foam. Encouraged pt to use for 20 min intervals, take a break and then use again for 20 min and try to build her tolerance.   Shoulder Instructions      Home Living Family/patient expects to be discharged to:: Private residence Living Arrangements: Children;Spouse/significant other Available Help at Discharge: Family;Available 24 hours/day Type of Home: House Home Access: Stairs to enter [employer] of Steps: 2-3 Entrance Stairs-Rails: None Home Layout: Two level;Able to live on main level with bedroom/bathroom     Bathroom Shower/Tub: Chief Strategy Officer: Standard Bathroom Accessibility: Yes How Accessible: Accessible via [facility]  Equipment: Dan HumphreysWalker - 2 wheels;Bedside commode (3 in 1)          Prior Functioning/Environment Level of Independence: Independent             OT Diagnosis: Generalized weakness;Acute pain   OT Problem List: Decreased strength;Decreased range of motion;Decreased activity tolerance;Impaired balance (sitting and/or standing);Decreased knowledge of use of DME or AE;Pain   OT Treatment/Interventions: Self-care/ADL training;DME and/or AE instruction;Therapeutic activities;Patient/family education    OT Goals(Current goals can be found in the care plan section) Acute Rehab  OT Goals Patient Stated Goal: No pain OT Goal Formulation: With patient Time For Goal Achievement: 12/23/14 Potential to Achieve Goals: Good ADL Goals Pt Will Perform Tub/Shower Transfer: 3 in 1;rolling Tener;with set-up;with supervision;ambulating;with caregiver independent in assisting Additional ADL Goal #1: Pt will verbalize understanding of compensatory technqiues for LB ADL  OT Frequency: Min 2X/week   Barriers to D/C:            Co-evaluation              End of Session Equipment Utilized During Treatment: Gait belt;Rolling Justiniano;Right knee immobilizer CPM Right Knee CPM Right Knee: Off Nurse Communication: Mobility status  Activity Tolerance: Patient limited by pain Patient left: in chair;with call bell/phone within reach   Time: 0935-1000 OT Time Calculation (min): 25 min Charges:  OT General Charges $OT Visit: 1 Procedure OT Evaluation $Initial OT Evaluation Tier I: 1 Procedure OT Treatments $Self Care/Home Management : 8-22 mins G-Codes:    Gwenneth Whiteman,HILLARY 12/16/2014, 11:00 AM   Luisa DagoHilary Lin Glazier, OTR/L  [phone] 12/16/2014

## 2014-12-16 NOTE — Progress Notes (Signed)
Subjective: Pt stable - pain ok on oxy dilaudid   Objective: Vital signs in last 24 hours: Temp:  [97.5 F (36.4 C)-98.6 F (37 C)] 98.1 F (36.7 C) (10/19 0500) Pulse Rate:  [57-95] 84 (10/19 0500) Resp:  [11-22] 20 (10/19 0500) BP: (120-165)/(63-90) 141/70 mmHg (10/19 0500) SpO2:  [95 %-100 %] 95 % (10/19 0500) Weight:  [80.786 kg (178 lb 1.6 oz)] 80.786 kg (178 lb 1.6 oz) (10/18 0854)  Intake/Output from previous day: 10/18 0701 - 10/19 0700 In: 1600 [I.V.:1600] Out: 950 [Urine:800; Blood:150] Intake/Output this shift:    Exam:  Intact pulses distally Dorsiflexion/Plantar flexion intact  Labs:  Recent Labs  12/16/14 0349  HGB 11.8*    Recent Labs  12/16/14 0349  WBC 11.4*  RBC 4.13  HCT 36.8  PLT 121*    Recent Labs  12/16/14 0349  NA 138  K 4.3  CL 102  CO2 29  BUN 7  CREATININE 0.70  GLUCOSE 153*  CALCIUM 8.6*   No results for input(s): LABPT, INR in the last 72 hours.  Assessment/Plan: Plan PT and CPM today - possible dc am   Sandie Swayze SCOTT 12/16/2014, 8:10 AM

## 2014-12-16 NOTE — Care Management Note (Signed)
Case Management Note  Patient Details  Name: Eilleen Kempfammy M Waller MRN: [id number] Date of Birth: August 02, 1970  Subjective/Objective:          S/p right total knee arthroplasty          Action/Plan: Patient's insurance requires home health be set up through 3rd party company [facility]. Contacted [facility] and requested HHPT be set up for start of service 12/18/14. Ref ID# [id number]. Spoke with Kipp BroodBrent from [employer], CPM, rolling [place] and 3N1 have been delivered to patient's home. Spoke with patient, she will have family to assist her after discharge. CM will continue to follow.      Expected Discharge Date:                  Expected Discharge Plan:  Home w Home Health Services  In-House Referral:  NA  Discharge planning Services  CM Consult  Post Acute Care Choice:  Durable Medical Equipment, Home Health Choice offered to:  Patient  DME Arranged:  3-N-1, CPM, Bia rolling DME Agency:  [employer]  HH Arranged:  PT HH Agency:     Status of Service:  In process, will continue to follow  Medicare Important Message Given:    Date Medicare IM Given:    Medicare IM give by:    Date Additional Medicare IM Given:    Additional Medicare Important Message give by:     If discussed at Long Length of Stay Meetings, dates discussed:    Additional Comments:  Monica BectonKrieg, Cherell Colvin Watson, RN 12/16/2014, 11:46 AM

## 2014-12-16 NOTE — Clinical Social Work Note (Signed)
CSW received referral for SNF.  Case discussed with case manager and plan is to discharge home.  CSW to sign off please re-consult if social work needs arise.  Henrique Parekh R. Chrishonda Hesch, MSW, LCSWA [phone]  

## 2014-12-16 NOTE — Progress Notes (Signed)
Utilization review completed.  

## 2014-12-17 LAB — CBC
HCT: 35.3 % — ABNORMAL LOW (ref 36.0–46.0)
Hemoglobin: 11.4 g/dL — ABNORMAL LOW (ref 12.0–15.0)
MCH: 28.6 pg (ref 26.0–34.0)
MCHC: 32.3 g/dL (ref 30.0–36.0)
MCV: 88.5 fL (ref 78.0–100.0)
Platelets: 118 10*3/uL — ABNORMAL LOW (ref 150–400)
RBC: 3.99 MIL/uL (ref 3.87–5.11)
RDW: 13.3 % (ref 11.5–15.5)
WBC: 12.9 10*3/uL — ABNORMAL HIGH (ref 4.0–10.5)

## 2014-12-17 MED ORDER — METHOCARBAMOL 500 MG PO TABS: 500.0000 mg | ORAL_TABLET | Freq: Four times a day (QID) | ORAL | Status: DC | PRN

## 2014-12-17 MED ORDER — DOCUSATE SODIUM 100 MG PO CAPS: 100.0000 mg | ORAL_CAPSULE | Freq: Two times a day (BID) | ORAL | Status: DC

## 2014-12-17 MED ORDER — OXYCODONE HCL 5 MG PO TABS: 5.0000 mg | ORAL_TABLET | ORAL | Status: DC | PRN

## 2014-12-17 MED ORDER — RIVAROXABAN 10 MG PO TABS: 10.0000 mg | ORAL_TABLET | Freq: Every day | ORAL | Status: DC

## 2014-12-17 NOTE — Progress Notes (Signed)
Subjective: Pt stable - stairs with PT today   Objective: Vital signs in last 24 hours: Temp:  [98.5 F (36.9 C)-99.4 F (37.4 C)] 99 F (37.2 C) (10/20 0545) Pulse Rate:  [86-99] 97 (10/20 0545) Resp:  [20] 20 (10/20 0545) BP: (131-139)/(73-81) 131/73 mmHg (10/20 0545) SpO2:  [94 %-96 %] 96 % (10/20 0545)  Intake/Output from previous day: 10/19 0701 - 10/20 0700 In: 1120 [P.O.:1120] Out: -  Intake/Output this shift:    Exam:  Neurovascular intact Sensation intact distally  Labs:  Recent Labs  12/16/14 0349 12/17/14 0300  HGB 11.8* 11.4*    Recent Labs  12/16/14 0349 12/17/14 0300  WBC 11.4* 12.9*  RBC 4.13 3.99  HCT 36.8 35.3*  PLT 121* 118*    Recent Labs  12/16/14 0349  NA 138  K 4.3  CL 102  CO2 29  BUN 7  CREATININE 0.70  GLUCOSE 153*  CALCIUM 8.6*   No results for input(s): LABPT, INR in the last 72 hours.  Assessment/Plan: Plan dc today after PT   [name] SCOTT 12/17/2014, 8:36 AM

## 2014-12-17 NOTE — Progress Notes (Signed)
Physical Therapy Treatment Patient Details Name: [name] MRN: [id number] DOB: 1970/03/24 Today's Date: 12/17/2014    History of Present Illness 44 y.o. female with hx of anxiety, seen s/p right total knee arthroplasty.    PT Comments    Pt is making excellent progress with mobility. Pt is currently mod. I with gait 23900ft with RW. Pt and boyfriend completed step training and reviewed there ex. Pt is agreeable to d/c home today. Recommend HHPT follow-up for ROM/exercises. Pt is ready for d/c home.  Follow Up Recommendations  Home health PT     Equipment Recommendations  None recommended by PT    Recommendations for Other Services       Precautions / Restrictions Precautions Precautions: Knee Required Braces or Orthoses: Knee Immobilizer - Right Knee Immobilizer - Right: On when out of bed or walking Restrictions Weight Bearing Restrictions: Yes RLE Weight Bearing: Weight bearing as tolerated    Mobility  Bed Mobility Overal bed mobility: Needs Assistance Bed Mobility: Supine to Sit     Supine to sit: Min assist (min assist with R LE) Sit to supine: Min assist   General bed mobility comments: assistance to lift R LE  Transfers Overall transfer level: Modified independent Equipment used: Rolling Mangel (2 wheeled)   Sit to Stand: Modified independent (Device/Increase time)            Ambulation/Gait Ambulation/Gait assistance: Modified independent (Device/Increase time) Ambulation Distance (Feet): 220 Feet Assistive device: Rolling Bors (2 wheeled) Gait Pattern/deviations: Step-through pattern;Decreased stride length;Decreased stance time - right;Decreased step length - left   Gait velocity interpretation: Below normal speed for age/gender     Stairs Stairs: Yes Stairs assistance: Min assist Stair Management: Backwards;No rails;With Raczkowski Number of Stairs: 2 General stair comments: boyfriend assisted and demonstrated clear understanding,  handout given  Wheelchair Mobility    Modified Rankin (Stroke Patients Only)       Balance Overall balance assessment: No apparent balance deficits (not formally assessed)                                  Cognition Arousal/Alertness: Awake/alert Behavior During Therapy: WFL for tasks assessed/performed Overall Cognitive Status: Within Functional Limits for tasks assessed                      Exercises Total Joint Exercises Ankle Circles/Pumps: AROM;Both;10 reps;Supine Quad Sets: AROM;Strengthening;Right;10 reps;Supine Hip ABduction/ADduction: AAROM;Strengthening;Right;10 reps;Supine Straight Leg Raises: AAROM;Strengthening;Right;10 reps;Supine Long Arc Quad: AAROM;Right;10 reps;Seated Knee Flexion: AAROM;Strengthening;Right;10 reps;Seated Goniometric ROM: 65*    General Comments        Pertinent Vitals/Pain Pain Assessment: 0-10 Pain Score: 5  Pain Location: r knee Pain Descriptors / Indicators: Aching;Burning Pain Intervention(s): Limited activity within patient's tolerance;Monitored during session    Home Living                      Prior Function            PT Goals (current goals can now be found in the care plan section) Progress towards PT goals: Progressing toward goals    Frequency  7X/week    PT Plan Current plan remains appropriate    Co-evaluation             End of Session Equipment Utilized During Treatment: Gait belt;Right knee immobilizer Activity Tolerance: Patient tolerated treatment well Patient left: in bed;with call bell/phone within reach;with  family/visitor present     Time: 0902-0940 PT Time Calculation (min) (ACUTE ONLY): 38 min  Charges:  $Gait Training: 8-22 mins $Therapeutic Exercise: 8-22 mins $Therapeutic Activity: 8-22 mins                    G Codes:      Greggory Stallion 12/17/2014, 9:51 AM

## 2014-12-17 NOTE — Discharge Planning (Signed)
IV removed. Pt and significant other given discharge instructions and prescriptions. No questions or concerns expressed at this time. Sent with belongings as well as the knee immobilizer and Zero Knee. Discharged home with boyfriend. Equipment needed already in the home. Taken out via [employer] consultantvol[employer]er.

## 2014-12-17 NOTE — Plan of Care (Signed)
Problem: Consults Goal: Diagnosis- Total Joint Replacement Outcome: Completed/Met Date Met:  12/17/14 Primary Total Knee     

## 2014-12-17 NOTE — Progress Notes (Signed)
Occupational Therapy Treatment Patient Details Name: [name] MRN: [id number] DOB: 08/13/1970 Today's Date: 12/17/2014    History of present illness 44 y.o. female with hx of anxiety, seen s/p right total knee arthroplasty.   OT comments  Patient presents to OT with decreased ADL independence s/p R TKR. All education has been completed and patient reports she is going home today.  Follow Up Recommendations  No OT follow up;Supervision - Intermittent    Equipment Recommendations  None recommended by OT    Recommendations for Other Services      Precautions / Restrictions Precautions Precautions: Knee Required Braces or Orthoses: Knee Immobilizer - Right Knee Immobilizer - Right: On when out of bed or walking Restrictions Weight Bearing Restrictions: Yes RLE Weight Bearing: Weight bearing as tolerated       Mobility               Balance                                   ADL                                         General ADL Comments: Patient presents in bed, dressed in her own nightgown, significant other at bedside. Reports that previous OT educated her on shower transfer backing in with 3 in 1 and legs on outside of tub. Verbal education provided on technique but patient declined to practice, stating, "I'm good." Educated patient that discharge instructions would indicate when she could shower. Patient reports her significant other will assist with LB self-care. She demosntrated ability to reach L leg/foot without difficulty. Educated patient regarding dressing operated leg first for pants/underwear. She verbalized understanding. She reports she has been toileting with nursing without difficulty using RW and 3 in 1 over toilet. All questions answered. No further OT needs.      Vision                     Perception     Praxis      Cognition   Behavior During Therapy: WFL for tasks assessed/performed Overall  Cognitive Status: Within Functional Limits for tasks assessed                       Extremity/Trunk Assessment               Exercises    Shoulder Instructions       General Comments      Pertinent Vitals/ Pain       Pain Assessment: 0-10 Pain Score: 5  Pain Location: R knee Pain Descriptors / Indicators: Aching Pain Intervention(s): Limited activity within patient's tolerance;Monitored during session  Home Living                                          Prior Functioning/Environment              Frequency Min 2X/week     Progress Toward Goals  OT Goals(current goals can now be found in the care plan section)  Progress towards OT goals: Progressing toward goals     Plan Discharge plan remains appropriate    Co-evaluation  End of Session    Activity Tolerance Patient tolerated treatment well   Patient Left in bed;with call bell/phone within reach;with family/visitor present   Nurse Communication Mobility status        Time: [id number] OT Time Calculation (min): 10 min  Charges: OT General Charges $OT Visit: 1 Procedure OT Treatments $Self Care/Home Management : 8-22 mins  Wesly Whisenant A 12/17/2014, 10:00 AM

## 2014-12-17 NOTE — Care Management (Signed)
[name] Faxed orders and op note to [facility] [phone]. [name] stated that the assigned home health agency will contact patient. Case manager relayed this information to [name]. Provided her with my contact number should she not hear from [employer] representative.

## 2014-12-24 ENCOUNTER — Ambulatory Visit (HOSPITAL_COMMUNITY): Payer: Managed Care, Other (non HMO) | Attending: Orthopedic Surgery

## 2014-12-24 DIAGNOSIS — R29898 Other symptoms and signs involving the musculoskeletal system: Secondary | ICD-10-CM

## 2014-12-24 DIAGNOSIS — M79604 Pain in right leg: Secondary | ICD-10-CM | POA: Diagnosis present

## 2014-12-24 DIAGNOSIS — Z96651 Presence of right artificial knee joint: Secondary | ICD-10-CM | POA: Diagnosis not present

## 2014-12-24 DIAGNOSIS — M25461 Effusion, right knee: Secondary | ICD-10-CM | POA: Diagnosis present

## 2014-12-24 DIAGNOSIS — R531 Weakness: Secondary | ICD-10-CM

## 2014-12-24 DIAGNOSIS — R2681 Unsteadiness on feet: Secondary | ICD-10-CM | POA: Diagnosis present

## 2014-12-24 DIAGNOSIS — R6889 Other general symptoms and signs: Secondary | ICD-10-CM

## 2014-12-24 DIAGNOSIS — Z7409 Other reduced mobility: Secondary | ICD-10-CM | POA: Diagnosis present

## 2014-12-24 NOTE — Patient Instructions (Signed)
Bracing With Heel Slides (Supine)    Slide heel to bottom, hold for three seconds at top. Slide foot out and tighten knee cap for 3 second hold. Repeat 15 times. Do 3 times a day.   Copyright  VHI. All rights reserved.  Short Arc [employer] CompanyQuad    Place a large can or rolled towel under left leg. Straighten leg. Hold 1 second. Repeat 10-20 times. Do 3 sessions per day. Use dog lease to assist with arms.   http://gt2.exer.us/299   Copyright  VHI. All rights reserved.   Seated Alternating Leg Raise (Marching)    Sit on chair/bed. Raise bent knee and return. Maintain a stable and neutral trunk position. Repeat with other leg. Do 2 sets of 15 repetitions, twice daily.   Copyright  VHI. All rights reserved.

## 2014-12-24 NOTE — Therapy (Signed)
[facility] [address] [place], [place], [address] Phone: [phone]   Fax:  [phone]  Physical Therapy Evaluation  Patient Details  Name: [name] MRN: [id number] Date of Birth: [birth year] Referring Provider: Dorene Grebe   Encounter Date: 12/24/2014      PT End of Session - 12/24/14 1815    Visit Number 1   Date for PT Re-Evaluation 02/23/15   Authorization Type [employer]    Authorization Time Period 90 days unlimited visits    Authorization - Visit Number 1   PT Start Time [phone]   PT Stop Time 0943   PT Time Calculation (min) 51 min   Activity Tolerance Patient tolerated treatment well;Patient limited by pain   Behavior During Therapy [facility] for tasks assessed/performed      Past Medical History  Diagnosis Date  . Chronic knee pain   . Chronic back pain   . Menorrhagia 09/09/2012  . Arthritis     Knee  . GERD (gastroesophageal reflux disease)   . Family history of anesthesia complication     Brother and Mother woke up during surgery; Heard and felt could not speak  . Anxiety   . Headache     stress    Past Surgical History  Procedure Laterality Date  . Cesarean section      X2   . Esophagogastroduodenoscopy N/A 12/27/2012    Procedure: ESOPHAGOGASTRODUODENOSCOPY (EGD);  Surgeon: West Bali, MD;  Location: [facility];  Service: Endoscopy;  Laterality: N/A;  2:30-moved to 13:45 Soledad Gerlach notified pt  . Tubal ligation      along with c-section  . Dilitation & currettage/hystroscopy with thermachoice ablation N/A 02/25/2013    Procedure: DILATATION & CURETTAGE/HYSTEROSCOPY WITH THERMACHOICE ABLATION;  Surgeon: [name], MD;  Location: [facility] ORS;  Service: Gynecology;  Laterality: N/A;  Total Therapy Time:8:54 minutes Temperature:87 degrees  . Endometrial ablation  12/14  . Cholecystectomy N/A 01/17/2013    Procedure: LAPAROSCOPIC CHOLECYSTECTOMY;  Surgeon: Marlane Hatcher, MD;  Location: [facility] ORS;   Service: General;  Laterality: N/A;  . Total knee arthroplasty Right 12/15/2014    Procedure: TOTAL KNEE ARTHROPLASTY;  Surgeon: Cammy Copa, MD;  Location: [facility] OR;  Service: Orthopedics;  Laterality: Right;    There were no vitals filed for this visit.  Visit Diagnosis:  Artificial knee joint present, right - Plan: PT plan of care cert/re-cert  Right leg weakness - Plan: PT plan of care cert/re-cert  Knee joint effusion, right - Plan: PT plan of care cert/re-cert  Decreased strength, endurance, and mobility - Plan: PT plan of care cert/re-cert  Unsteadiness on feet - Plan: PT plan of care cert/re-cert  Pain of right lower extremity - Plan: PT plan of care cert/re-cert      Subjective Assessment - 12/24/14 0858    Subjective R TKA 12/15/14 at [facility]. Pt was unable to get home health set up, hence pt has been working on exercises at home that she learned in hospital, using a CPM overnight, and working on progressing walking.    Patient is accompained by: Family member   Pertinent History History of pain, swelling, bone spurs in R knee, playing a significant role in limiting mobility for last year. Worse wth prolonged staning at job, lots of standing, walking, climbing.    Limitations Sitting;Standing;Walking   How long can you sit comfortably? 15 minutes before repositioning.    How long can you stand comfortably? 15 minutes  before repositioning.    How long can you walk comfortably? 10-15 minutes of walking   Patient Stated Goals Improve mobility back to baseline, improve independence.    Currently in Pain? Yes   Pain Score 6    Pain Location Knee   Pain Orientation Right   Pain Descriptors / Indicators Aching   Pain Type Surgical pain   Pain Radiating Towards Medial peripatellar pain.    Pain Onset 1 to 4 weeks ago   Pain Frequency Constant   Aggravating Factors  End range extension positioning.     Pain Relieving Factors Moving the joint.    Effect of Pain on  Daily Activities Limiting mobility.    Multiple Pain Sites No            [facility] PT Assessment - 12/24/14 0001    Assessment   Medical Diagnosis s/p R TKA    Referring Provider Dorene GrebeScott Dean    Onset Date/Surgical Date 12/15/14   Hand Dominance Right   Prior Therapy Acute only.    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  Gait Analysis:  Antalgic, with decreased weight-bearing on RLE. Safe use of RW for 2-point rolling gait, maintained at safe distance.  Strength: Hip Abduction: L 3/5, R 3+/5 Ankle Dorsiflexion: 5/5 bilat Ankle Plantar Flexion: at least 4/5 bilat, not tested in weight bearing.  Functional strength: requires min-modA for moving RLE during bed mobility.  Joint Mobility:  R knee joint with uniform warmth and effusion; bilateral ankles noted to appear mildly swollen, pt reporting that she has 'cankles' and they are at baseline. Palpation reveals soft adipose feel.  Joint ROM:  R Knee 10-71 degrees flexion L knee -5-143 degrees flexion Education: Explained to patient that achievement of 90 degrees knee flexion is desired by some orthopedists by POD3 and that she should consider progressing her ROM and/or CPM slightly more aggressively to improve knee flexion. Pt indicates that surgeon has provided some long term goals, but no specificity in short term goals associated with CPM  use or ROM.               [facility] Adult PT Treatment/Exercise - 12/24/14 0001    Knee/Hip Exercises: Seated   Marching Both;AROM;Strengthening;1 set;10 reps   Knee/Hip Exercises: Supine   Quad Sets 1 set;Strengthening;AAROM;Right;10 reps  3 second hold   Short Arc [employer]Quad Sets AAROM;Right;Strengthening;1 set;10 reps  3 second hold   Heel Slides AAROM;Strengthening;Right;1 set;10 reps  3 second stretch                  PT Short Term Goals - 12/24/14 1822    PT SHORT TERM GOAL #1   Title Pt will demonstrate independence in beginning home exercise program by twos weeks after commencement of therapy, to affirm self-efficacy in work at home to making progress toward goals.  PT SHORT TERM GOAL #2   Title After 2 weeks, pt will describe in detail 3 ways to manage pain/swelling at home to demonstrate greater self-efficacy in self-management of wellness and function.    PT SHORT TERM GOAL #3   Title Pt will ambulate 535ft with LRAD after 4 weeks, to demonstrate improved activity tolerance and improved independence in limited community ambulation.    PT SHORT TERM GOAL #4   Title Pt will demonstrate modified independence in all bedmobility by the second week of therapy to decrease caregiver burden.            PT Long Term Goals - 12/24/14 1824    PT LONG TERM GOAL #1   Title Pt will demonstrate independence in advanced home exercise program by 1 week prior to discharge, to further self-efficacy in continuation of progress toward goals after discharge from therapy.    PT LONG TERM GOAL #2   Title Pt will ambulate 1581ft c LRAD and s exacerbation of pain after 8 weeks, to demonstrate improved activity tolerance and improved independence in community ambulation.    PT LONG TERM GOAL #3   Title After 8 weeks, pt will improve gait speed by 80% to demonstrate improved indep in functional mobility and to decrease risk of falls in the home.                Plan - 12/24/14  1817    Clinical Impression Statement Pt presenting with RLE weakness, pain, stiffness, joint effusion, ecchymosis, and motor control deficits consistent with subacute postoperative presentation after TKA; however, pt demonstrating substantially more weakness in R quads, already compensated for by additional activation of R glutes. The above deficits are limiting independence in mobility, ambulation, IADL, ADL, and return to work, and pt will benefit from skilled PT intervention to address said deficits in order to restore to PLOF of independence.    Pt will benefit from skilled therapeutic intervention in order to improve on the following deficits Abnormal gait;Decreased range of motion;Decreased coordination;Difficulty walking;Increased fascial restricitons;Pain;Decreased mobility;Decreased strength;Increased edema;Impaired sensation;Hypomobility;Decreased balance;Decreased activity tolerance   Rehab Potential Good   PT Frequency 3x / week   PT Duration 8 weeks   PT Treatment/Interventions ADLs/Self Care Home Management;Therapeutic exercise;Therapeutic activities;Functional mobility training;Stair training;Gait training;DME Instruction;Balance training;Patient/family education;Passive range of motion   PT Next Visit Plan Review HEP, 2 minute walk test, Gait speed, TUG, contnue to target isolated quads strengthening, patella mobility, tibiofemoral joint mobs/distraction, bilateral abduction strengthening, modA SLR.     PT Home Exercise Plan Seated marching, heel slides, quad sets, short arc quads.    Consulted and Agree with Plan of Care Patient         Problem List Patient Active Problem List   Diagnosis Date Noted  . Arthritis of knee, degenerative 12/15/2014  . Anxiety as acute reaction to exceptional stress 11/28/2014  . Chondromalacia of right knee 11/28/2014  . Rebound headache 11/06/2014  . Stressful life event affecting family 11/06/2014  . Encounter for long-term opiate analgesic  use 09/15/2014  . Tinea versicolor 09/15/2014  . Chronic back pain 06/16/2014  . Arthritis of both knees 06/16/2014  . DUB (dysfunctional uterine bleeding) 02/17/2013  . Family history of colon cancer 12/19/2012  . Dyspepsia 12/17/2012  . GERD (gastroesophageal reflux disease) 11/03/2012  . Analgesic overuse headache 11/03/2012  . Low back pain 09/09/2012  . Menorrhagia 09/09/2012  . Arthritis 07/24/2012    Buccola,Sarah C 12/24/2014, 6:31 PM  6:31 PM  Rosamaria Lints,  PT, DPT [place] License # [id number]       [facility] [address] [place], [place], [address] Phone: [phone]   Fax:  [phone]  Name: [name] MRN: [id number] Date of Birth: 1970-07-31

## 2014-12-28 ENCOUNTER — Ambulatory Visit (HOSPITAL_COMMUNITY): Payer: Managed Care, Other (non HMO) | Admitting: Physical Therapy

## 2014-12-28 DIAGNOSIS — R6889 Other general symptoms and signs: Secondary | ICD-10-CM

## 2014-12-28 DIAGNOSIS — Z96651 Presence of right artificial knee joint: Secondary | ICD-10-CM | POA: Diagnosis not present

## 2014-12-28 DIAGNOSIS — M79604 Pain in right leg: Secondary | ICD-10-CM

## 2014-12-28 DIAGNOSIS — M25461 Effusion, right knee: Secondary | ICD-10-CM

## 2014-12-28 DIAGNOSIS — Z7409 Other reduced mobility: Secondary | ICD-10-CM

## 2014-12-28 DIAGNOSIS — R531 Weakness: Secondary | ICD-10-CM

## 2014-12-28 DIAGNOSIS — R29898 Other symptoms and signs involving the musculoskeletal system: Secondary | ICD-10-CM

## 2014-12-28 DIAGNOSIS — R2681 Unsteadiness on feet: Secondary | ICD-10-CM

## 2014-12-28 NOTE — Therapy (Signed)
[facility] [address] [place], [place], [address] Phone: [phone]   Fax:  [phone]  Physical Therapy Treatment  Patient Details  Name: [name] M Waller MRN: [id number] Date of Birth: 03-May-1970 Referring Provider: Dorene GrebeScott Dean   Encounter Date: 12/28/2014      PT End of Session - 12/28/14 1750    Visit Number 2   Number of Visits 12   Date for PT Re-Evaluation 02/23/15   Authorization Type [employer]    Authorization Time Period 90 days unlimited visits    PT Start Time 1646   PT Stop Time 1728   PT Time Calculation (min) 42 min   Equipment Utilized During Treatment Gait belt   Activity Tolerance Patient tolerated treatment well   Behavior During Therapy [facility] for tasks assessed/performed      Past Medical History  Diagnosis Date  . Chronic knee pain   . Chronic back pain   . Menorrhagia 09/09/2012  . Arthritis     Knee  . GERD (gastroesophageal reflux disease)   . Family history of anesthesia complication     Brother and Mother woke up during surgery; Heard and felt could not speak  . Anxiety   . Headache     stress    Past Surgical History  Procedure Laterality Date  . Cesarean section      X2   . Esophagogastroduodenoscopy N/A 12/27/2012    Procedure: ESOPHAGOGASTRODUODENOSCOPY (EGD);  Surgeon: West BaliSandi L Fields, MD;  [facility]: [facility];  Service: Endoscopy;  Laterality: N/A;  2:30-moved to 13:45 Soledad GerlachLeigh Ann notified pt  . Tubal ligation      along with c-section  . Dilitation & currettage/hystroscopy with thermachoice ablation N/A 02/25/2013    Procedure: DILATATION & CURETTAGE/HYSTEROSCOPY WITH THERMACHOICE ABLATION;  Surgeon: Tilda BurrowJohn V Ferguson, MD;  Location: [facility] ORS;  Service: Gynecology;  Laterality: N/A;  Total Therapy Time:8:54 minutes Temperature:87 degrees  . Endometrial ablation  12/14  . Cholecystectomy N/A 01/17/2013    Procedure: LAPAROSCOPIC CHOLECYSTECTOMY;  Surgeon: Marlane HatcherWilliam S Bradford, MD;  Location: [facility]  ORS;  Service: General;  Laterality: N/A;  . Total knee arthroplasty Right 12/15/2014    Procedure: TOTAL KNEE ARTHROPLASTY;  Surgeon: Cammy CopaScott Gregory Dean, MD;  [facility];  Service: Orthopedics;  Laterality: Right;    There were no vitals filed for this visit.  Visit Diagnosis:  Artificial knee joint present, right  Right leg weakness  Knee joint effusion, right  Decreased strength, endurance, and mobility  Unsteadiness on feet  Pain of right lower extremity      Subjective Assessment - 12/28/14 1649    Subjective Patient had a good weekend, her knee is just feeling stiff today, no real pain today    Patient is accompained by: Family member   Pertinent History History of pain, swelling, bone spurs in R knee, playing a significant role in limiting mobility for last year. Worse wth prolonged staning at job, lots of standing, walking, climbing.    Currently in Pain? No/denies  just feeling stiff             [facility] PT Assessment - 12/28/14 0001    6 minute walk test results    Endurance additional comments 6MWT 91579ft with Armstrong, 0.5759m/s    High Level Balance   High Level Balance Comments TUG 9.7, 9.5, 9.2                     [facility] Adult PT Treatment/Exercise -  12/28/14 0001    Knee/Hip Exercises: Stretches   Active Hamstring Stretch Both;3 reps;30 seconds   Active Hamstring Stretch Limitations 12 inch box    Quad Stretch Right;3 reps;30 seconds   Quad Stretch Limitations prone with rope    Gastroc Stretch Both;3 reps;30 seconds   Gastroc Stretch Limitations slantboard    Knee/Hip Exercises: Standing   Heel Raises Both;1 set;15 reps   Heel Raises Limitations toe and heel raises    Forward Lunges Right;1 set;10 reps   Forward Lunges Limitations 6 inch box    Terminal Knee Extension Limitations 1x15, 3 seocond holds    Rocker Board Limitations x20AP, x20 lateral U HHA    Knee/Hip Exercises: Supine   Quad Sets Right;1 set;15 reps   Quad Sets  Limitations 3 second holds    Short Arc [employer] Right;1 set;10 reps   Straight Leg Raises Right;1 set;10 reps   Manual Therapy   Manual Therapy Edema management   Edema Management retro-massage to reduce edema and pain around R knee                 PT Education - 12/28/14 1750    Education provided Yes   Education Details retro-massage at home, encouraged to ambulate outside of [place] as much possible    Person(s) Educated Patient   Methods Explanation;Demonstration   Comprehension Verbalized understanding          PT Short Term Goals - 12/24/14 1822    PT SHORT TERM GOAL #1   Title Pt will demonstrate independence in beginning home exercise program by twos weeks after commencement of therapy, to affirm self-efficacy in work at home to making progress toward goals.   PT SHORT TERM GOAL #2   Title After 2 weeks, pt will describe in detail 3 ways to manage pain/swelling at home to demonstrate greater self-efficacy in self-management of wellness and function.    PT SHORT TERM GOAL #3   Title Pt will ambulate 557ft with LRAD after 4 weeks, to demonstrate improved activity tolerance and improved independence in limited community ambulation.    PT SHORT TERM GOAL #4   Title Pt will demonstrate modified independence in all bedmobility by the second week of therapy to decrease caregiver burden.            PT Long Term Goals - 12/24/14 1824    PT LONG TERM GOAL #1   Title Pt will demonstrate independence in advanced home exercise program by 1 week prior to discharge, to further self-efficacy in continuation of progress toward goals after discharge from therapy.    PT LONG TERM GOAL #2   Title Pt will ambulate 1548ft c LRAD and s exacerbation of pain after 8 weeks, to demonstrate improved activity tolerance and improved independence in community ambulation.    PT LONG TERM GOAL #3   Title After 8 weeks, pt will improve gait speed by 80% to demonstrate improved indep in  functional mobility and to decrease risk of falls in the home.                Plan - 12/28/14 1752    Clinical Impression Statement Introduced functional stretchnig, functional exercises, and manual today. Patient reports taht she is feeling much better already and spent most of the weekend doing her exercises, she is already able to get out of bed much more easily. Perfomred 6 minute walk test and TUG today. Patient tolerated today's session well overall weith good performance of all  tasks.    Pt will benefit from skilled therapeutic intervention in order to improve on the following deficits Abnormal gait;Decreased range of motion;Decreased coordination;Difficulty walking;Increased fascial restricitons;Pain;Decreased mobility;Decreased strength;Increased edema;Impaired sensation;Hypomobility;Decreased balance;Decreased activity tolerance   Rehab Potential Good   PT Frequency 3x / week   PT Duration 8 weeks   PT Treatment/Interventions ADLs/Self Care Home Management;Therapeutic exercise;Therapeutic activities;Functional mobility training;Stair training;Gait training;DME Instruction;Balance training;Patient/family education;Passive range of motion   PT Next Visit Plan  contnue to target isolated quads strengthening, patella mobility, tibiofemoral joint mobs/distraction, bilateral abduction strengthening, modA SLR.     PT Home Exercise Plan Seated marching, heel slides, quad sets, short arc quads.    Consulted and Agree with Plan of Care Patient        Problem List Patient Active Problem List   Diagnosis Date Noted  . Arthritis of knee, degenerative 12/15/2014  . Anxiety as acute reaction to exceptional stress 11/28/2014  . Chondromalacia of right knee 11/28/2014  . Rebound headache 11/06/2014  . Stressful life event affecting family 11/06/2014  . Encounter for long-term opiate analgesic use 09/15/2014  . Tinea versicolor 09/15/2014  . Chronic back pain 06/16/2014  . Arthritis of  both knees 06/16/2014  . DUB (dysfunctional uterine bleeding) 02/17/2013  . Family history of colon cancer 12/19/2012  . Dyspepsia 12/17/2012  . GERD (gastroesophageal reflux disease) 11/03/2012  . Analgesic overuse headache 11/03/2012  . Low back pain 09/09/2012  . Menorrhagia 09/09/2012  . Arthritis 07/24/2012    Nedra Hai PT, DPT [phone]  [facility] [address] [place], [place], [address] Phone: [phone]   Fax:  [phone]  Name: RHYLEI MCQUAIG MRN: [id number] Date of Birth: Jul 05, 1970

## 2014-12-30 ENCOUNTER — Ambulatory Visit (HOSPITAL_COMMUNITY): Payer: Managed Care, Other (non HMO) | Attending: Orthopedic Surgery

## 2014-12-30 DIAGNOSIS — Z96651 Presence of right artificial knee joint: Secondary | ICD-10-CM | POA: Diagnosis present

## 2014-12-30 DIAGNOSIS — R531 Weakness: Secondary | ICD-10-CM

## 2014-12-30 DIAGNOSIS — R6889 Other general symptoms and signs: Secondary | ICD-10-CM

## 2014-12-30 DIAGNOSIS — R29898 Other symptoms and signs involving the musculoskeletal system: Secondary | ICD-10-CM | POA: Diagnosis not present

## 2014-12-30 DIAGNOSIS — Z7409 Other reduced mobility: Secondary | ICD-10-CM | POA: Diagnosis present

## 2014-12-30 DIAGNOSIS — M79604 Pain in right leg: Secondary | ICD-10-CM | POA: Diagnosis present

## 2014-12-30 DIAGNOSIS — M25461 Effusion, right knee: Secondary | ICD-10-CM | POA: Diagnosis present

## 2014-12-30 DIAGNOSIS — R2681 Unsteadiness on feet: Secondary | ICD-10-CM

## 2014-12-30 NOTE — Therapy (Signed)
[facility] [address] [place], [place], [address] Phone: [phone]   Fax:  [phone]  Physical Therapy Treatment  Patient Details  Name: [name] MRN: [id number] Date of Birth: 1971/01/14 Referring Provider: Dorene Grebe  Encounter Date: 12/30/2014      PT End of Session - 12/30/14 1355    Visit Number 3   Number of Visits 12   Date for PT Re-Evaluation 02/23/15   Authorization Type [employer]    Authorization Time Period 90 days unlimited visits    Authorization - Visit Number 2   PT Start Time 1348   PT Stop Time 1437   PT Time Calculation (min) 49 min   Activity Tolerance Patient tolerated treatment well   Behavior During Therapy [facility] for tasks assessed/performed      Past Medical History  Diagnosis Date  . Chronic knee pain   . Chronic back pain   . Menorrhagia 09/09/2012  . Arthritis     Knee  . GERD (gastroesophageal reflux disease)   . Family history of anesthesia complication     Brother and Mother woke up during surgery; Heard and felt could not speak  . Anxiety   . Headache     stress    Past Surgical History  Procedure Laterality Date  . Cesarean section      X2   . Esophagogastroduodenoscopy N/A 12/27/2012    Procedure: ESOPHAGOGASTRODUODENOSCOPY (EGD);  Surgeon: West Bali, MD;  [facility];  Service: Endoscopy;  Laterality: N/A;  2:30-moved to 13:45 Soledad Gerlach notified pt  . Tubal ligation      along with c-section  . Dilitation & currettage/hystroscopy with thermachoice ablation N/A 02/25/2013    Procedure: DILATATION & CURETTAGE/HYSTEROSCOPY WITH THERMACHOICE ABLATION;  Surgeon: [name], MD;  Location: [facility] ORS;  Service: Gynecology;  Laterality: N/A;  Total Therapy Time:8:54 minutes Temperature:87 degrees  . Endometrial ablation  12/14  . Cholecystectomy N/A 01/17/2013    Procedure: LAPAROSCOPIC CHOLECYSTECTOMY;  Surgeon: Marlane Hatcher, MD;  Location: [facility] ORS;  Service:  General;  Laterality: N/A;  . Total knee arthroplasty Right 12/15/2014    Procedure: TOTAL KNEE ARTHROPLASTY;  Surgeon: Cammy Copa, MD;  Location: [facility] OR;  Service: Orthopedics;  Laterality: Right;    There were no vitals filed for this visit.  Visit Diagnosis:  Right leg weakness  Artificial knee joint present, right  Knee joint effusion, right  Decreased strength, endurance, and mobility  Unsteadiness on feet  Pain of right lower extremity      Subjective Assessment - 12/30/14 1352    Subjective Pt stated no real pain today, pt stated knee is really tight today.  Entered with no AD, stated she has been compliant with HEP   Currently in Pain? No/denies   Pain Descriptors / Indicators Tightness     MD apt:  Scott Deam 02/28/2014 AROM 4-100 degrees      [facility] Adult PT Treatment/Exercise - 12/30/14 0001    Exercises   Exercises Knee/Hip   Knee/Hip Exercises: Stretches   Active Hamstring Stretch Both;3 reps;30 seconds   Active Hamstring Stretch Limitations 14 in box   Quad Stretch Right;3 reps;30 seconds   Quad Stretch Limitations prone with rope    Knee: Self-Stretch to increase Flexion 5 reps;10 seconds   Knee: Self-Stretch Limitations knee drives 5x 10" on 8in step   Gastroc Stretch Both;3 reps;30 seconds   Gastroc Stretch Limitations slantboard    Knee/Hip Exercises:  Standing   Heel Raises Both;1 set;15 reps   Heel Raises Limitations toe and heel raises    Terminal Knee Extension Right;10 reps;Theraband   Theraband Level (Terminal Knee Extension) Level 2 (Red)   Terminal Knee Extension Limitations 5" holds   Knee/Hip Exercises: Supine   Quad Sets Right;1 set;15 reps   Quad Sets Limitations 5 second holds   Short Arc [employer] Right;15 reps   Heel Slides AROM;10 reps   Heel Slides Limitations 5" holds   Terminal Knee Extension Limitations Trial, unable to complete this session due to quad weakness and ROM limited   Knee/Hip Exercises: Prone   Other  Prone Exercises TKE 10x 5"   Manual Therapy   Manual Therapy Edema management   Edema Management retro-massage to reduce edema and pain around R knee             PT Short Term Goals - 12/24/14 1822    PT SHORT TERM GOAL #1   Title Pt will demonstrate independence in beginning home exercise program by twos weeks after commencement of therapy, to affirm self-efficacy in work at home to making progress toward goals.   PT SHORT TERM GOAL #2   Title After 2 weeks, pt will describe in detail 3 ways to manage pain/swelling at home to demonstrate greater self-efficacy in self-management of wellness and function.    PT SHORT TERM GOAL #3   Title Pt will ambulate 553ft with LRAD after 4 weeks, to demonstrate improved activity tolerance and improved independence in limited community ambulation.    PT SHORT TERM GOAL #4   Title Pt will demonstrate modified independence in all bedmobility by the second week of therapy to decrease caregiver burden.            PT Long Term Goals - 12/24/14 1824    PT LONG TERM GOAL #1   Title Pt will demonstrate independence in advanced home exercise program by 1 week prior to discharge, to further self-efficacy in continuation of progress toward goals after discharge from therapy.    PT LONG TERM GOAL #2   Title Pt will ambulate 1563ft c LRAD and s exacerbation of pain after 8 weeks, to demonstrate improved activity tolerance and improved independence in community ambulation.    PT LONG TERM GOAL #3   Title After 8 weeks, pt will improve gait speed by 80% to demonstrate improved indep in functional mobility and to decrease risk of falls in the home.                Plan - 12/30/14 1441    Clinical Impression Statement Session focus on improivng ROM with functional stretching and strengthening for quad strengthening to improve knee extension and manual for edema control.  Added TKE exercises and educated importance of improving knee extension to improve  gait mechanics.  Manual techniques complete for edema control with vast improvments in ROM following.  Pt with apt scheduled with surgeon, pt given copy of eval to givento doctor  AROM following retro massage increased to 4-100 degrees.  Pt encouraged to be aggressive with knee ROM CPM at hiome per pain tolerance.     PT Next Visit Plan Continue to focus on ROM especially extension, next session educated on scar tissue massage, patella mobilty and tib/femoral joint mobs if dressings removed at MD today, add abduction strenghtening and SLR as ROM improves for strengthening.          Problem List Patient Active Problem List  Diagnosis Date Noted  . Arthritis of knee, degenerative 12/15/2014  . Anxiety as acute reaction to exceptional stress 11/28/2014  . Chondromalacia of right knee 11/28/2014  . Rebound headache 11/06/2014  . Stressful life event affecting family 11/06/2014  . Encounter for long-term opiate analgesic use 09/15/2014  . Tinea versicolor 09/15/2014  . Chronic back pain 06/16/2014  . Arthritis of both knees 06/16/2014  . DUB (dysfunctional uterine bleeding) 02/17/2013  . Family history of colon cancer 12/19/2012  . Dyspepsia 12/17/2012  . GERD (gastroesophageal reflux disease) 11/03/2012  . Analgesic overuse headache 11/03/2012  . Low back pain 09/09/2012  . Menorrhagia 09/09/2012  . Arthritis 07/24/2012   Becky Saxasey Blinda Turek, LPTA; CBIS [phone]  Juel BurrowCockerham, Fletcher Rathbun Jo 12/30/2014, 3:17 PM  [facility] [address] [place], [place], [address] Phone: [phone]   Fax:  [phone]  Name: [name] MRN: [id number] Date of Birth: 07-04-1970

## 2015-01-01 ENCOUNTER — Ambulatory Visit (HOSPITAL_COMMUNITY): Payer: Managed Care, Other (non HMO)

## 2015-01-01 DIAGNOSIS — M25461 Effusion, right knee: Secondary | ICD-10-CM

## 2015-01-01 DIAGNOSIS — R531 Weakness: Secondary | ICD-10-CM

## 2015-01-01 DIAGNOSIS — Z96651 Presence of right artificial knee joint: Secondary | ICD-10-CM

## 2015-01-01 DIAGNOSIS — R29898 Other symptoms and signs involving the musculoskeletal system: Secondary | ICD-10-CM | POA: Diagnosis not present

## 2015-01-01 DIAGNOSIS — R2681 Unsteadiness on feet: Secondary | ICD-10-CM

## 2015-01-01 DIAGNOSIS — Z7409 Other reduced mobility: Secondary | ICD-10-CM

## 2015-01-01 DIAGNOSIS — M79604 Pain in right leg: Secondary | ICD-10-CM

## 2015-01-01 DIAGNOSIS — R6889 Other general symptoms and signs: Secondary | ICD-10-CM

## 2015-01-01 NOTE — Therapy (Signed)
[facility] [address] [place], [place], [address] Phone: [phone]   Fax:  [phone]  Physical Therapy Treatment  Patient Details  Name: [name] MRN: [id number] Date of Birth: Oct 03, 1970 Referring Provider: Dorene GrebeScott [name]  Encounter Date: 01/01/2015      PT End of Session - 01/01/15 1519    Visit Number 4   Number of Visits 12   Date for PT Re-Evaluation 02/23/15   Authorization Type [employer]    Authorization Time Period 90 days unlimited visits    Authorization - Visit Number 4   PT Start Time 1433   PT Stop Time 1518   PT Time Calculation (min) 45 min   Activity Tolerance Patient tolerated treatment well   Behavior During Therapy [facility] for tasks assessed/performed      Past Medical History  Diagnosis Date  . Chronic knee pain   . Chronic back pain   . Menorrhagia 09/09/2012  . Arthritis     Knee  . GERD (gastroesophageal reflux disease)   . Family history of anesthesia complication     Brother and Mother woke up during surgery; Heard and felt could not speak  . Anxiety   . Headache     stress    Past Surgical History  Procedure Laterality Date  . Cesarean section      X2   . Esophagogastroduodenoscopy N/A 12/27/2012    Procedure: ESOPHAGOGASTRODUODENOSCOPY (EGD);  Surgeon: West BaliSandi L Fields, MD;  [facility]: [facility];  Service: Endoscopy;  Laterality: N/A;  2:30-moved to 13:45 Soledad GerlachLeigh Ann notified pt  . Tubal ligation      along with c-section  . Dilitation & currettage/hystroscopy with thermachoice ablation N/A 02/25/2013    Procedure: DILATATION & CURETTAGE/HYSTEROSCOPY WITH THERMACHOICE ABLATION;  Surgeon: Tilda BurrowJohn V Ferguson, MD;  Location: [facility] ORS;  Service: Gynecology;  Laterality: N/A;  Total Therapy Time:8:54 minutes Temperature:87 degrees  . Endometrial ablation  12/14  . Cholecystectomy N/A 01/17/2013    Procedure: LAPAROSCOPIC CHOLECYSTECTOMY;  Surgeon: Marlane HatcherWilliam S Bradford, MD;  Location: [facility] ORS;  Service:  General;  Laterality: N/A;  . Total knee arthroplasty Right 12/15/2014    Procedure: TOTAL KNEE ARTHROPLASTY;  Surgeon: Sarah CopaScott Gregory Dean, MD;  [facility];  Service: Orthopedics;  Laterality: Right;    There were no vitals filed for this visit.  Visit Diagnosis:  Right leg weakness  Artificial knee joint present, right  Knee joint effusion, right  Decreased strength, endurance, and mobility  Unsteadiness on feet  Pain of right lower extremity      Subjective Assessment - 01/01/15 1437    Subjective Pt stated knee was stiff, pain meds taken prior session today.  Pt reported MD happy with knee progress, dressings removed    Currently in Pain? No/denies               [facility] Treatment/Exercise - 01/01/15 0001    Exercises   Exercises Knee/Hip   Knee/Hip Exercises: Stretches   Active Hamstring Stretch Both;3 reps;30 seconds   Active Hamstring Stretch Limitations 14 in box   Quad Stretch Right;3 reps;30 seconds   Quad Stretch Limitations prone with rope    Knee: Self-Stretch Limitations knee drives 10x 10" on 8in step   Gastroc Stretch Both;3 reps;30 seconds   Gastroc Stretch Limitations slantboard    Knee/Hip Exercises: Standing   Heel Raises Both;1 set;15 reps   Heel Raises Limitations toe and heel raises    Knee Flexion Right;10 reps  Knee Flexion Limitations 6in step behind for terminal knee flexion   Terminal Knee Extension Right;10 reps;Theraband   Theraband Level (Terminal Knee Extension) Level 2 (Red)   Terminal Knee Extension Limitations 5" holds   Knee/Hip Exercises: Sidelying   Hip ABduction Both;10 reps   Hip ABduction Limitations cueing for proper form   Knee/Hip Exercises: Prone   Other Prone Exercises TKE 10x 5"   Manual Therapy   Manual Therapy Edema management;Joint mobilization;Myofascial release;Soft tissue mobilization   Edema Management retro-massage to reduce edema and pain around R knee    Joint Mobilization Patella mobs all  directions and fib/tib mobs complete and instructed to pt and daughter   Soft tissue mobilization Soft tissue mobilization to quads    Myofascial Release Scar tissue massage complete with LE elevated                   PT Short Term Goals - 12/24/14 1822    PT SHORT TERM GOAL #1   Title Pt will demonstrate independence in beginning home exercise program by twos weeks after commencement of therapy, to affirm self-efficacy in work at home to making progress toward goals.   PT SHORT TERM GOAL #2   Title After 2 weeks, pt will describe in detail 3 ways to manage pain/swelling at home to demonstrate greater self-efficacy in self-management of wellness and function.    PT SHORT TERM GOAL #3   Title Pt will ambulate 548ft with LRAD after 4 weeks, to demonstrate improved activity tolerance and improved independence in limited community ambulation.    PT SHORT TERM GOAL #4   Title Pt will demonstrate modified independence in all bedmobility by the second week of therapy to decrease caregiver burden.            PT Long Term Goals - 12/24/14 1824    PT LONG TERM GOAL #1   Title Pt will demonstrate independence in advanced home exercise program by 1 week prior to discharge, to further self-efficacy in continuation of progress toward goals after discharge from therapy.    PT LONG TERM GOAL #2   Title Pt will ambulate 1594ft c LRAD and s exacerbation of pain after 8 weeks, to demonstrate improved activity tolerance and improved independence in community ambulation.    PT LONG TERM GOAL #3   Title After 8 weeks, pt will improve gait speed by 80% to demonstrate improved indep in functional mobility and to decrease risk of falls in the home.                Plan - 01/01/15 1524    Clinical Impression Statement Session focus on improving ROM with functional strengthening and quad strengthening exercises to improve knee extension.  Added standing knee flexion for hamstring strengthening  with foot on 6in step to simulate getting in and out of shower for functional strengthening.  Began manual joint mobs for patella and fib/tib to improve knee mobility and scar tissue massage technqiues instructed to pt. and her daughter to reduce adhesions as well as manual retro massage for edema control.   No reports of increased pain through session   PT Next Visit Plan Continue to focus on ROM, functional stretches and strengthening and manual techniquies for edema control, patella mobs, scar tissue massage and STM to quad to reduce tightness.        Problem List Patient Active Problem List   Diagnosis Date Noted  . Arthritis of knee, degenerative 12/15/2014  . Anxiety as  acute reaction to exceptional stress 11/28/2014  . Chondromalacia of right knee 11/28/2014  . Rebound headache 11/06/2014  . Stressful life event affecting family 11/06/2014  . Encounter for long-term opiate analgesic use 09/15/2014  . Tinea versicolor 09/15/2014  . Chronic back pain 06/16/2014  . Arthritis of both knees 06/16/2014  . DUB (dysfunctional uterine bleeding) 02/17/2013  . Family history of colon cancer 12/19/2012  . Dyspepsia 12/17/2012  . GERD (gastroesophageal reflux disease) 11/03/2012  . Analgesic overuse headache 11/03/2012  . Low back pain 09/09/2012  . Menorrhagia 09/09/2012  . Arthritis 07/24/2012   Becky Sax, LPTA; CBIS [phone]  Juel Burrow 01/01/2015, 6:00 PM  [facility] [address] [place], [place], [address] Phone: [phone]   Fax:  [phone]  Name: [name] MRN: [id number] Date of Birth: May 18, 1970

## 2015-01-05 ENCOUNTER — Ambulatory Visit (HOSPITAL_COMMUNITY): Payer: Managed Care, Other (non HMO)

## 2015-01-05 DIAGNOSIS — M79604 Pain in right leg: Secondary | ICD-10-CM

## 2015-01-05 DIAGNOSIS — R6889 Other general symptoms and signs: Secondary | ICD-10-CM

## 2015-01-05 DIAGNOSIS — M25461 Effusion, right knee: Secondary | ICD-10-CM

## 2015-01-05 DIAGNOSIS — R2681 Unsteadiness on feet: Secondary | ICD-10-CM

## 2015-01-05 DIAGNOSIS — R531 Weakness: Secondary | ICD-10-CM

## 2015-01-05 DIAGNOSIS — R29898 Other symptoms and signs involving the musculoskeletal system: Secondary | ICD-10-CM

## 2015-01-05 DIAGNOSIS — Z96651 Presence of right artificial knee joint: Secondary | ICD-10-CM

## 2015-01-05 DIAGNOSIS — Z7409 Other reduced mobility: Secondary | ICD-10-CM

## 2015-01-05 NOTE — Therapy (Signed)
[place] [facility] [address] [place], [place], [address] Phone: [phone]   Fax:  [phone]  Physical Therapy Treatment  Patient Details  Name: [name] MRN: [id number] Date of Birth: 08-11-1970 Referring Provider: Dorene Grebe  Encounter Date: 01/05/2015      PT End of Session - 01/05/15 0940    Visit Number 5   Number of Visits 12   Date for PT Re-Evaluation 02/23/15   Authorization Type [employer]    Authorization Time Period 90 days unlimited visits    Authorization - Visit Number 5   PT Start Time [phone]   PT Stop Time 1022   PT Time Calculation (min) 48 min   Activity Tolerance Patient tolerated treatment well   Behavior During Therapy [facility] for tasks assessed/performed      Past Medical History  Diagnosis Date  . Chronic knee pain   . Chronic back pain   . Menorrhagia 09/09/2012  . Arthritis     Knee  . GERD (gastroesophageal reflux disease)   . Family history of anesthesia complication     Brother and Mother woke up during surgery; Heard and felt could not speak  . Anxiety   . Headache     stress    Past Surgical History  Procedure Laterality Date  . Cesarean section      X2   . Esophagogastroduodenoscopy N/A 12/27/2012    Procedure: ESOPHAGOGASTRODUODENOSCOPY (EGD);  Surgeon: West Bali, MD;  [facility];  Service: Endoscopy;  Laterality: N/A;  2:30-moved to 13:45 Soledad Gerlach notified pt  . Tubal ligation      along with c-section  . Dilitation & currettage/hystroscopy with thermachoice ablation N/A 02/25/2013    Procedure: DILATATION & CURETTAGE/HYSTEROSCOPY WITH THERMACHOICE ABLATION;  Surgeon: [name], MD;  Location: [facility] ORS;  Service: Gynecology;  Laterality: N/A;  Total Therapy Time:8:54 minutes Temperature:87 degrees  . Endometrial ablation  12/14  . Cholecystectomy N/A 01/17/2013    Procedure: LAPAROSCOPIC CHOLECYSTECTOMY;  Surgeon: Marlane Hatcher, MD;  Location: [facility] ORS;  Service:  General;  Laterality: N/A;  . Total knee arthroplasty Right 12/15/2014    Procedure: TOTAL KNEE ARTHROPLASTY;  Surgeon: Cammy Copa, MD;  Location: [facility] OR;  Service: Orthopedics;  Laterality: Right;    There were no vitals filed for this visit.  Visit Diagnosis:  Right leg weakness  Artificial knee joint present, right  Knee joint effusion, right  Decreased strength, endurance, and mobility  Unsteadiness on feet  Pain of right lower extremity      Subjective Assessment - 01/05/15 0935    Subjective Pt stated knee is stiff, reported CPM is being picked up today.  Has been compliant with HEP daily and family members have been assisting with retro massage at home for swelling   Currently in Pain? Yes   Pain Score 6    Pain Location Knee   Pain Orientation Right   Pain Descriptors / Indicators Tightness;Sore          [facility] Adult PT Treatment/Exercise - 01/05/15 0001    Knee/Hip Exercises: Stretches   Active Hamstring Stretch Both;3 reps;30 seconds   Active Hamstring Stretch Limitations 14 in box   Quad Stretch Right;3 reps;30 seconds   Quad Stretch Limitations prone with rope    Knee: Self-Stretch Limitations knee drives 10x 10" on 8in step   Gastroc Stretch Both;3 reps;30 seconds   Gastroc Stretch Limitations slantboard    Knee/Hip Exercises: Aerobic  Stationary Bike rocking x 6min seat 9   Knee/Hip Exercises: Standing   Heel Raises Both;1 set;15 reps   Heel Raises Limitations toe and heel raises    Knee Flexion Right;10 reps   Knee Flexion Limitations 6in step behind for terminal knee flexion   Terminal Knee Extension Right;10 reps;Theraband   Theraband Level (Terminal Knee Extension) Level 2 (Red)   Terminal Knee Extension Limitations 5" holds   SLS Rt and Lt 60" 1st attempt   Knee/Hip Exercises: Supine   Quad Sets Right;1 set;15 reps   Short Arc Quad Sets Right;10 reps   Heel Slides AROM;10 reps   Heel Slides Limitations 5" holds   Terminal Knee  Extension 10 reps   Terminal Knee Extension Limitations 5" holds    Knee/Hip Exercises: Prone   Other Prone Exercises TKE 10x 5"   Manual Therapy   Manual Therapy Edema management;Joint mobilization;Myofascial release;Soft tissue mobilization   Edema Management retro-massage to reduce edema and pain around R knee    Joint Mobilization Patella mobs all directions and fib/tib mobs complete and instructed to pt and daughter   Soft tissue mobilization Soft tissue mobilization to quads    Myofascial Release Scar tissue massage complete with LE elevated                   PT Short Term Goals - 12/24/14 1822    PT SHORT TERM GOAL #1   Title Pt will demonstrate independence in beginning home exercise program by twos weeks after commencement of therapy, to affirm self-efficacy in work at home to making progress toward goals.   PT SHORT TERM GOAL #2   Title After 2 weeks, pt will describe in detail 3 ways to manage pain/swelling at home to demonstrate greater self-efficacy in self-management of wellness and function.    PT SHORT TERM GOAL #3   Title Pt will ambulate 54100ft with LRAD after 4 weeks, to demonstrate improved activity tolerance and improved independence in limited community ambulation.    PT SHORT TERM GOAL #4   Title Pt will demonstrate modified independence in all bedmobility by the second week of therapy to decrease caregiver burden.            PT Long Term Goals - 12/24/14 1824    PT LONG TERM GOAL #1   Title Pt will demonstrate independence in advanced home exercise program by 1 week prior to discharge, to further self-efficacy in continuation of progress toward goals after discharge from therapy.    PT LONG TERM GOAL #2   Title Pt will ambulate 15300ft c LRAD and s exacerbation of pain after 8 weeks, to demonstrate improved activity tolerance and improved independence in community ambulation.    PT LONG TERM GOAL #3   Title After 8 weeks, pt will improve gait speed by  80% to demonstrate improved indep in functional mobility and to decrease risk of falls in the home.                Plan - 01/05/15 1206    Clinical Impression Statement Continued session focus on improving ROM with quad strengtheing to improve knee extension.  Pt able to complete supine TKE this session with minimal cueing for technique.  AROM improving 4-106 degrees.  Continued manual scar tissue massage and retro massage to reduce adhesions and edema control.  Began rocking on bicycle to assist with knee flexion, unable to make full revolation this session seat 9.  Pt reports pain reduced at  end of session.     PT Next Visit Plan Continue to focus on ROM, functional stretches and strengthening and manual techniquies for edema control, patella mobs, scar tissue massage and STM to quad to reduce tightness. Next session resume Rockerboard and begin lateral and forward step ups for functional strengthening.          Problem List Patient Active Problem List   Diagnosis Date Noted  . Arthritis of knee, degenerative 12/15/2014  . Anxiety as acute reaction to exceptional stress 11/28/2014  . Chondromalacia of right knee 11/28/2014  . Rebound headache 11/06/2014  . Stressful life event affecting family 11/06/2014  . Encounter for long-term opiate analgesic use 09/15/2014  . Tinea versicolor 09/15/2014  . Chronic back pain 06/16/2014  . Arthritis of both knees 06/16/2014  . DUB (dysfunctional uterine bleeding) 02/17/2013  . Family history of colon cancer 12/19/2012  . Dyspepsia 12/17/2012  . GERD (gastroesophageal reflux disease) 11/03/2012  . Analgesic overuse headache 11/03/2012  . Low back pain 09/09/2012  . Menorrhagia 09/09/2012  . Arthritis 07/24/2012   Becky Sax, LPTA; CBIS [phone]  Juel Burrow 01/05/2015, 12:10 PM  [place] [facility] [address] [place], [place], [address] Phone: [phone]   Fax:   [phone]  Name: AILED DEFIBAUGH MRN: [id number] Date of Birth: August 01, 1970

## 2015-01-06 ENCOUNTER — Ambulatory Visit (HOSPITAL_COMMUNITY): Payer: Managed Care, Other (non HMO)

## 2015-01-08 ENCOUNTER — Ambulatory Visit (HOSPITAL_COMMUNITY): Payer: Managed Care, Other (non HMO)

## 2015-01-08 DIAGNOSIS — Z96651 Presence of right artificial knee joint: Secondary | ICD-10-CM

## 2015-01-08 DIAGNOSIS — Z7409 Other reduced mobility: Secondary | ICD-10-CM

## 2015-01-08 DIAGNOSIS — M79604 Pain in right leg: Secondary | ICD-10-CM

## 2015-01-08 DIAGNOSIS — M25461 Effusion, right knee: Secondary | ICD-10-CM

## 2015-01-08 DIAGNOSIS — R531 Weakness: Secondary | ICD-10-CM

## 2015-01-08 DIAGNOSIS — R29898 Other symptoms and signs involving the musculoskeletal system: Secondary | ICD-10-CM

## 2015-01-08 DIAGNOSIS — R2681 Unsteadiness on feet: Secondary | ICD-10-CM

## 2015-01-08 NOTE — Therapy (Signed)
[place] [facility] [address] [place], [place], [address] Phone: [phone]   Fax:  [phone]  Physical Therapy Treatment  Patient Details  Name: [name] MRN: [id number] Date of Birth: [birth year] Referring Provider: Dorene Grebe  Encounter Date: 01/08/2015      PT End of Session - 01/08/15 1355    Visit Number 6   Number of Visits 12   Date for PT Re-Evaluation 02/23/15   Authorization Type [employer]    Authorization Time Period 90 days unlimited visits    Authorization - Visit Number 6   PT Start Time 1348   PT Stop Time 1437   PT Time Calculation (min) 49 min   Activity Tolerance Patient tolerated treatment well   Behavior During Therapy [facility] for tasks assessed/performed      Past Medical History  Diagnosis Date  . Chronic knee pain   . Chronic back pain   . Menorrhagia 09/09/2012  . Arthritis     Knee  . GERD (gastroesophageal reflux disease)   . Family history of anesthesia complication     Brother and Mother woke up during surgery; Heard and felt could not speak  . Anxiety   . Headache     stress    Past Surgical History  Procedure Laterality Date  . Cesarean section      X2   . Esophagogastroduodenoscopy N/A 12/27/2012    Procedure: ESOPHAGOGASTRODUODENOSCOPY (EGD);  Surgeon: West Bali, MD;  [facility];  Service: Endoscopy;  Laterality: N/A;  2:30-moved to 13:45 Soledad Gerlach notified pt  . Tubal ligation      along with c-section  . Dilitation & currettage/hystroscopy with thermachoice ablation N/A 02/25/2013    Procedure: DILATATION & CURETTAGE/HYSTEROSCOPY WITH THERMACHOICE ABLATION;  Surgeon: [name], MD;  Location: [facility] ORS;  Service: Gynecology;  Laterality: N/A;  Total Therapy Time:8:54 minutes Temperature:87 degrees  . Endometrial ablation  12/14  . Cholecystectomy N/A 01/17/2013    Procedure: LAPAROSCOPIC CHOLECYSTECTOMY;  Surgeon: Marlane Hatcher, MD;  Location: [facility] ORS;  Service:  General;  Laterality: N/A;  . Total knee arthroplasty Right 12/15/2014    Procedure: TOTAL KNEE ARTHROPLASTY;  Surgeon: Cammy Copa, MD;  Location: [facility] OR;  Service: Orthopedics;  Laterality: Right;    There were no vitals filed for this visit.  Visit Diagnosis:  Right leg weakness  Artificial knee joint present, right  Knee joint effusion, right  Decreased strength, endurance, and mobility  Unsteadiness on feet  Pain of right lower extremity      Subjective Assessment - 01/08/15 1348    Subjective Pt stated knee is stiff today, reports increased ease getting in and out of shower.  Increased edema Rt knee.  Pain free with pain medication taken prior apt.     Currently in Pain? No/denies   Pain Descriptors / Indicators Tightness           [facility] Adult PT Treatment/Exercise - 01/08/15 0001    Exercises   Exercises Knee/Hip   Knee/Hip Exercises: Stretches   Active Hamstring Stretch Both;3 reps;30 seconds   Active Hamstring Stretch Limitations 14 in box   Quad Stretch Right;3 reps;30 seconds   Quad Stretch Limitations prone with rope    Knee: Self-Stretch Limitations knee drives 10x 10" on 8in step   Gastroc Stretch Both;3 reps;30 seconds   Gastroc Stretch Limitations slantboard    Knee/Hip Exercises: Aerobic   Stationary Bike Full revolution seat 10 x 8 min  Knee/Hip Exercises: Standing   Terminal Knee Extension Right;10 reps;Theraband   Theraband Level (Terminal Knee Extension) Level 2 (Red)   Terminal Knee Extension Limitations 5" holds   Lateral Step Up Right;10 reps;Hand Hold: 2;Step Height: 4"   Forward Step Up Right;10 reps;Hand Hold: 1;Step Height: 4"   Rocker Board 2 minutes   Knee/Hip Exercises: Supine   Short Arc Quad Sets Right;15 reps   Heel Slides AROM;10 reps   Heel Slides Limitations 5" holds   Terminal Knee Extension 15 reps   Terminal Knee Extension Limitations 5" holds    Manual Therapy   Manual Therapy Edema management;Joint  mobilization;Myofascial release;Soft tissue mobilization   Edema Management retro-massage to reduce edema and pain around R knee    Joint Mobilization Patella mobs all directions and fib/tib mobs complete and instructed to pt and daughter   Soft tissue mobilization Soft tissue mobilization to quads    Myofascial Release Scar tissue massage complete with LE elevated            PT Short Term Goals - 12/24/14 1822    PT SHORT TERM GOAL #1   Title Pt will demonstrate independence in beginning home exercise program by twos weeks after commencement of therapy, to affirm self-efficacy in work at home to making progress toward goals.   PT SHORT TERM GOAL #2   Title After 2 weeks, pt will describe in detail 3 ways to manage pain/swelling at home to demonstrate greater self-efficacy in self-management of wellness and function.    PT SHORT TERM GOAL #3   Title Pt will ambulate 545ft with LRAD after 4 weeks, to demonstrate improved activity tolerance and improved independence in limited community ambulation.    PT SHORT TERM GOAL #4   Title Pt will demonstrate modified independence in all bedmobility by the second week of therapy to decrease caregiver burden.            PT Long Term Goals - 12/24/14 1824    PT LONG TERM GOAL #1   Title Pt will demonstrate independence in advanced home exercise program by 1 week prior to discharge, to further self-efficacy in continuation of progress toward goals after discharge from therapy.    PT LONG TERM GOAL #2   Title Pt will ambulate 1569ft c LRAD and s exacerbation of pain after 8 weeks, to demonstrate improved activity tolerance and improved independence in community ambulation.    PT LONG TERM GOAL #3   Title After 8 weeks, pt will improve gait speed by 80% to demonstrate improved indep in functional mobility and to decrease risk of falls in the home.                Plan - 01/08/15 1437    Clinical Impression Statement Continues session focus  on improving ROM and progressed functional strengthening.  Continued with manual technqiues for edema control and to improve patella mobiltiy.  AROM improving 2-110 degrees following manual.  Pt able to make full revolution on bike seat 10.   PT Next Visit Plan Continue to focus on ROM, stretches and strengthening, manual technqiues for edema control, scar tissue massage and patella mobs.  Increase step up training to 6in step next session if pain tolerable.        Problem List Patient Active Problem List   Diagnosis Date Noted  . Arthritis of knee, degenerative 12/15/2014  . Anxiety as acute reaction to exceptional stress 11/28/2014  . Chondromalacia of right knee 11/28/2014  . Rebound  headache 11/06/2014  . Stressful life event affecting family 11/06/2014  . Encounter for long-term opiate analgesic use 09/15/2014  . Tinea versicolor 09/15/2014  . Chronic back pain 06/16/2014  . Arthritis of both knees 06/16/2014  . DUB (dysfunctional uterine bleeding) 02/17/2013  . Family history of colon cancer 12/19/2012  . Dyspepsia 12/17/2012  . GERD (gastroesophageal reflux disease) 11/03/2012  . Analgesic overuse headache 11/03/2012  . Low back pain 09/09/2012  . Menorrhagia 09/09/2012  . Arthritis 07/24/2012   Becky Saxasey Cockerham, LPTA; CBIS [phone]  Juel BurrowCockerham, Casey Jo 01/08/2015, 2:44 PM  [place] [facility] [address] [place], [place], [address] Phone: [phone]   Fax:  [phone]  Name: [name] MRN: [id number] Date of Birth: [birth year]

## 2015-01-11 ENCOUNTER — Ambulatory Visit (HOSPITAL_COMMUNITY): Payer: Managed Care, Other (non HMO) | Admitting: Physical Therapy

## 2015-01-11 DIAGNOSIS — M79604 Pain in right leg: Secondary | ICD-10-CM

## 2015-01-11 DIAGNOSIS — R2681 Unsteadiness on feet: Secondary | ICD-10-CM

## 2015-01-11 DIAGNOSIS — Z7409 Other reduced mobility: Secondary | ICD-10-CM

## 2015-01-11 DIAGNOSIS — M25461 Effusion, right knee: Secondary | ICD-10-CM

## 2015-01-11 DIAGNOSIS — Z96651 Presence of right artificial knee joint: Secondary | ICD-10-CM

## 2015-01-11 DIAGNOSIS — R531 Weakness: Secondary | ICD-10-CM

## 2015-01-11 DIAGNOSIS — R29898 Other symptoms and signs involving the musculoskeletal system: Secondary | ICD-10-CM

## 2015-01-11 DIAGNOSIS — R6889 Other general symptoms and signs: Secondary | ICD-10-CM

## 2015-01-11 NOTE — Therapy (Signed)
[place] [facility] [address] [place], [place], [address] Phone: [phone]   Fax:  [phone]  Physical Therapy Treatment  Patient Details  Name: [name] MRN: [id number] Date of Birth: [birth year] Referring Provider: Dorene Grebe  Encounter Date: 01/11/2015      PT End of Session - 01/11/15 1058    Visit Number 7   Number of Visits 12   Date for PT Re-Evaluation 02/23/15   Authorization Type [employer]    Authorization Time Period 90 days unlimited visits    PT Start Time [phone]   PT Stop Time 1024   PT Time Calculation (min) 36 min   Activity Tolerance Patient tolerated treatment well   Behavior During Therapy [facility] for tasks assessed/performed      Past Medical History  Diagnosis Date  . Chronic knee pain   . Chronic back pain   . Menorrhagia 09/09/2012  . Arthritis     Knee  . GERD (gastroesophageal reflux disease)   . Family history of anesthesia complication     Brother and Mother woke up during surgery; Heard and felt could not speak  . Anxiety   . Headache     stress    Past Surgical History  Procedure Laterality Date  . Cesarean section      X2   . Esophagogastroduodenoscopy N/A 12/27/2012    Procedure: ESOPHAGOGASTRODUODENOSCOPY (EGD);  Surgeon: West Bali, MD;  [facility];  Service: Endoscopy;  Laterality: N/A;  2:30-moved to 13:45 Soledad Gerlach notified pt  . Tubal ligation      along with c-section  . Dilitation & currettage/hystroscopy with thermachoice ablation N/A 02/25/2013    Procedure: DILATATION & CURETTAGE/HYSTEROSCOPY WITH THERMACHOICE ABLATION;  Surgeon: [name], MD;  Location: [facility];  Service: Gynecology;  Laterality: N/A;  Total Therapy Time:8:54 minutes Temperature:87 degrees  . Endometrial ablation  12/14  . Cholecystectomy N/A 01/17/2013    Procedure: LAPAROSCOPIC CHOLECYSTECTOMY;  Surgeon: Marlane Hatcher, MD;  Location: [facility] ORS;  Service: General;  Laterality: N/A;  .  Total knee arthroplasty Right 12/15/2014    Procedure: TOTAL KNEE ARTHROPLASTY;  Surgeon: Cammy Copa, MD;  Location: [facility] OR;  Service: Orthopedics;  Laterality: Right;    There were no vitals filed for this visit.  Visit Diagnosis:  Right leg weakness  Artificial knee joint present, right  Knee joint effusion, right  Decreased strength, endurance, and mobility  Unsteadiness on feet  Pain of right lower extremity      Subjective Assessment - 01/11/15 0949    Subjective Pt states her pain is 2/10.  STates she took pain meds prior to appointment. Reports compliance with HEP.   Currently in Pain? Yes   Pain Score 2    Pain Location Knee   Pain Orientation Right                         [facility] Adult PT Treatment/Exercise - 01/11/15 0950    Knee/Hip Exercises: Stretches   Active Hamstring Stretch Both;3 reps;30 seconds   Active Hamstring Stretch Limitations 14 in box   Knee: Self-Stretch Limitations knee drives 10x 10" on 12in step   Gastroc Stretch Both;3 reps;30 seconds   Gastroc Stretch Limitations slantboard    Knee/Hip Exercises: Standing   Knee Flexion Right;15 reps   Knee Flexion Limitations 6in step behind for terminal knee flexion   Lateral Step Up Right;Step Height: 4";15 reps;Hand Hold: 2  Lateral Step Up Limitations 4"   Forward Step Up Right;Step Height: 4";Hand Hold: 1;15 reps   Forward Step Up Limitations 4"   Step Down Right;15 reps;Step Height: 4";Hand Hold: 1   Step Down Limitations 4"   Manual Therapy   Manual Therapy Edema management;Joint mobilization;Myofascial release;Soft tissue mobilization   Edema Management retro-massage to reduce edema and pain around R knee    Joint Mobilization Patella mobs all directions and fib/tib mobs complete and instructed to pt and daughter   Soft tissue mobilization Soft tissue mobilization to quads    Myofascial Release Scar tissue massage complete with LE elevated                    PT Short Term Goals - 12/24/14 1822    PT SHORT TERM GOAL #1   Title Pt will demonstrate independence in beginning home exercise program by twos weeks after commencement of therapy, to affirm self-efficacy in work at home to making progress toward goals.   PT SHORT TERM GOAL #2   Title After 2 weeks, pt will describe in detail 3 ways to manage pain/swelling at home to demonstrate greater self-efficacy in self-management of wellness and function.    PT SHORT TERM GOAL #3   Title Pt will ambulate 529ft with LRAD after 4 weeks, to demonstrate improved activity tolerance and improved independence in limited community ambulation.    PT SHORT TERM GOAL #4   Title Pt will demonstrate modified independence in all bedmobility by the second week of therapy to decrease caregiver burden.            PT Long Term Goals - 12/24/14 1824    PT LONG TERM GOAL #1   Title Pt will demonstrate independence in advanced home exercise program by 1 week prior to discharge, to further self-efficacy in continuation of progress toward goals after discharge from therapy.    PT LONG TERM GOAL #2   Title Pt will ambulate 1551ft c LRAD and s exacerbation of pain after 8 weeks, to demonstrate improved activity tolerance and improved independence in community ambulation.    PT LONG TERM GOAL #3   Title After 8 weeks, pt will improve gait speed by 80% to demonstrate improved indep in functional mobility and to decrease risk of falls in the home.                Plan - 01/11/15 1058    Clinical Impression Statement Pt was late for appointment, unable to complete full session.  Focused mainly on scar/swelling reduction and eccentric strengthening.  Added forward step downs with abiltiy to complete slow and controlled.  No complaints of pain during therex.  Pt with noted scar tissue, especially distally. Able to reduce adhesions with manual techniques.     PT Next Visit Plan Continue to focus on ROM, stretches and  strengthening, manual technqiues for edema control, scar tissue massage and patella mobs.  Increase step up training to 6in step next session if pain tolerable.        Problem List Patient Active Problem List   Diagnosis Date Noted  . Arthritis of knee, degenerative 12/15/2014  . Anxiety as acute reaction to exceptional stress 11/28/2014  . Chondromalacia of right knee 11/28/2014  . Rebound headache 11/06/2014  . Stressful life event affecting family 11/06/2014  . Encounter for long-term opiate analgesic use 09/15/2014  . Tinea versicolor 09/15/2014  . Chronic back pain 06/16/2014  . Arthritis of both knees 06/16/2014  .  DUB (dysfunctional uterine bleeding) 02/17/2013  . Family history of colon cancer 12/19/2012  . Dyspepsia 12/17/2012  . GERD (gastroesophageal reflux disease) 11/03/2012  . Analgesic overuse headache 11/03/2012  . Low back pain 09/09/2012  . Menorrhagia 09/09/2012  . Arthritis 07/24/2012    Lurena Nidamy B Frazier, PTA/CLT [phone]  01/11/2015, 11:01 AM  [place] [facility] [address] [place], [place], [address] Phone: [phone]   Fax:  [phone]  Name: Eilleen Kempfammy M Waller MRN: [id number] Date of Birth: 1970/07/25

## 2015-01-13 ENCOUNTER — Telehealth (HOSPITAL_COMMUNITY): Payer: Self-pay

## 2015-01-13 ENCOUNTER — Ambulatory Visit (HOSPITAL_COMMUNITY): Payer: Managed Care, Other (non HMO)

## 2015-01-13 DIAGNOSIS — R29898 Other symptoms and signs involving the musculoskeletal system: Secondary | ICD-10-CM | POA: Diagnosis not present

## 2015-01-13 DIAGNOSIS — R2681 Unsteadiness on feet: Secondary | ICD-10-CM

## 2015-01-13 DIAGNOSIS — M79604 Pain in right leg: Secondary | ICD-10-CM

## 2015-01-13 DIAGNOSIS — Z96651 Presence of right artificial knee joint: Secondary | ICD-10-CM

## 2015-01-13 DIAGNOSIS — Z7409 Other reduced mobility: Secondary | ICD-10-CM

## 2015-01-13 DIAGNOSIS — R531 Weakness: Secondary | ICD-10-CM

## 2015-01-13 DIAGNOSIS — M25461 Effusion, right knee: Secondary | ICD-10-CM

## 2015-01-13 DIAGNOSIS — R6889 Other general symptoms and signs: Secondary | ICD-10-CM

## 2015-01-13 NOTE — Therapy (Signed)
[facility] [address] [place], [place], [address] Phone: [phone]   Fax:  [phone]  Physical Therapy Treatment  Patient Details  Name: [name] MRN: [id number] Date of Birth: [birth year] Referring Provider: Dorene Grebe  Encounter Date: 01/13/2015      PT End of Session - 01/13/15 1649    Visit Number 8   Number of Visits 12   Date for PT Re-Evaluation 02/23/15   Authorization Type [employer]    Authorization Time Period 90 days unlimited visits    Authorization - Visit Number 8   PT Start Time 1648   PT Stop Time 1731   PT Time Calculation (min) 43 min   Activity Tolerance Patient tolerated treatment well   Behavior During Therapy [facility] for tasks assessed/performed      Past Medical History  Diagnosis Date  . Chronic knee pain   . Chronic back pain   . Menorrhagia 09/09/2012  . Arthritis     Knee  . GERD (gastroesophageal reflux disease)   . Family history of anesthesia complication     Brother and Mother woke up during surgery; Heard and felt could not speak  . Anxiety   . Headache     stress    Past Surgical History  Procedure Laterality Date  . Cesarean section      X2   . Esophagogastroduodenoscopy N/A 12/27/2012    Procedure: ESOPHAGOGASTRODUODENOSCOPY (EGD);  Surgeon: West Bali, MD;  Location: [facility];  Service: Endoscopy;  Laterality: N/A;  2:30-moved to 13:45 Soledad Gerlach notified pt  . Tubal ligation      along with c-section  . Dilitation & currettage/hystroscopy with thermachoice ablation N/A 02/25/2013    Procedure: DILATATION & CURETTAGE/HYSTEROSCOPY WITH THERMACHOICE ABLATION;  Surgeon: [name], MD;  Location: [facility] ORS;  Service: Gynecology;  Laterality: N/A;  Total Therapy Time:8:54 minutes Temperature:87 degrees  . Endometrial ablation  12/14  . Cholecystectomy N/A 01/17/2013    Procedure: LAPAROSCOPIC CHOLECYSTECTOMY;  Surgeon: Marlane Hatcher, MD;  Location: [facility] ORS;  Service:  General;  Laterality: N/A;  . Total knee arthroplasty Right 12/15/2014    Procedure: TOTAL KNEE ARTHROPLASTY;  Surgeon: Cammy Copa, MD;  Location: [facility] OR;  Service: Orthopedics;  Laterality: Right;    There were no vitals filed for this visit.  Visit Diagnosis:  Right leg weakness  Artificial knee joint present, right  Knee joint effusion, right  Decreased strength, endurance, and mobility  Unsteadiness on feet  Pain of right lower extremity      Subjective Assessment - 01/13/15 1649    Subjective Pt believes the weather affects her pain, increased achey today from cold weather.  Current pain scale 4-5/10   Currently in Pain? Yes   Pain Score 5    Pain Location Knee   Pain Orientation Right   Pain Descriptors / Indicators Aching;Tightness            [facility] PT Assessment - 01/13/15 0001    Assessment   Medical Diagnosis s/p R TKA    Referring Provider Dorene Grebe   Onset Date/Surgical Date 12/15/14   Hand Dominance Right   Next MD Visit August Saucer 12/01   Prior Therapy Acute only.                      [facility] Adult PT Treatment/Exercise - 01/13/15 0001    Exercises   Exercises Knee/Hip   Knee/Hip Exercises: Stretches  Active Hamstring Stretch Both;3 reps;30 seconds   Active Hamstring Stretch Limitations 14 in box   Quad Stretch Right;3 reps;30 seconds   Quad Stretch Limitations prone with rope    Knee: Self-Stretch Limitations knee drives 10x 10" on 12in step   Gastroc Stretch Both;3 reps;30 seconds   Gastroc Stretch Limitations slantboard    Knee/Hip Exercises: Standing   Knee Flexion Right;15 reps   Knee Flexion Limitations 6in step behind for terminal knee flexion   Terminal Knee Extension Right;15 reps;Theraband   Theraband Level (Terminal Knee Extension) Level 2 (Red)   Terminal Knee Extension Limitations 5" holds   Lateral Step Up Right;Step Height: 4";15 reps;Hand Hold: 2   Forward Step Up Right;15 reps;Hand Hold: 0;Step Height: 6"    Step Down Right;10 reps;Hand Hold: 1;Step Height: 6"   Functional Squat 15 reps   Other Standing Knee Exercises sidestepping 2RT GTB   Knee/Hip Exercises: Supine   Heel Slides AROM;10 reps   Heel Slides Limitations 2-113   Manual Therapy   Manual Therapy Edema management;Joint mobilization;Myofascial release;Soft tissue mobilization   Edema Management retro-massage to reduce edema and pain around R knee    Joint Mobilization Patella mobs all directions and fib/tib mobs complete and instructed to pt and daughter   Soft tissue mobilization Soft tissue mobilization to quads    Myofascial Release Scar tissue massage complete with LE elevated                   PT Short Term Goals - 12/24/14 1822    PT SHORT TERM GOAL #1   Title Pt will demonstrate independence in beginning home exercise program by twos weeks after commencement of therapy, to affirm self-efficacy in work at home to making progress toward goals.   PT SHORT TERM GOAL #2   Title After 2 weeks, pt will describe in detail 3 ways to manage pain/swelling at home to demonstrate greater self-efficacy in self-management of wellness and function.    PT SHORT TERM GOAL #3   Title Pt will ambulate 52000ft with LRAD after 4 weeks, to demonstrate improved activity tolerance and improved independence in limited community ambulation.    PT SHORT TERM GOAL #4   Title Pt will demonstrate modified independence in all bedmobility by the second week of therapy to decrease caregiver burden.            PT Long Term Goals - 12/24/14 1824    PT LONG TERM GOAL #1   Title Pt will demonstrate independence in advanced home exercise program by 1 week prior to discharge, to further self-efficacy in continuation of progress toward goals after discharge from therapy.    PT LONG TERM GOAL #2   Title Pt will ambulate 156500ft c LRAD and s exacerbation of pain after 8 weeks, to demonstrate improved activity tolerance and improved independence in  community ambulation.    PT LONG TERM GOAL #3   Title After 8 weeks, pt will improve gait speed by 80% to demonstrate improved indep in functional mobility and to decrease risk of falls in the home.                Plan - 01/13/15 1715    Clinical Impression Statement Session focus on ROM, functional stretches and strengthening exercises and manual technqiues for edema control.  Able to increase step height with step ups and downs with good form and eccentric control.  Added squats for functional strengthening with minimal cueing for weight distribution and sidestepping  for glut med strengthening.  Ended session with manual retro massage for edema control, patella mobs to improve tracting and soft tissue massage to quad for pain control.  AROM progressing well with improved to 2-113 degrees following manual.  Pt encouraged to apply ice at home for pain and edema control.     PT Next Visit Plan Continue to focus on ROM, stretches and strengthening, manual technqiues for edema control, scar tissue massage and patella mobs.  Progress functional strengthening as able.          Problem List Patient Active Problem List   Diagnosis Date Noted  . Arthritis of knee, degenerative 12/15/2014  . Anxiety as acute reaction to exceptional stress 11/28/2014  . Chondromalacia of right knee 11/28/2014  . Rebound headache 11/06/2014  . Stressful life event affecting family 11/06/2014  . Encounter for long-term opiate analgesic use 09/15/2014  . Tinea versicolor 09/15/2014  . Chronic back pain 06/16/2014  . Arthritis of both knees 06/16/2014  . DUB (dysfunctional uterine bleeding) 02/17/2013  . Family history of colon cancer 12/19/2012  . Dyspepsia 12/17/2012  . GERD (gastroesophageal reflux disease) 11/03/2012  . Analgesic overuse headache 11/03/2012  . Low back pain 09/09/2012  . Menorrhagia 09/09/2012  . Arthritis 07/24/2012   Becky Sax, LPTA; CBIS [phone]  Juel Burrow 01/13/2015, 5:38 PM  [facility] [address] [place], [place], [address] Phone: [phone]   Fax:  [phone]  Name: [name] MRN: [id number] Date of Birth: Mar 18, 1970

## 2015-01-13 NOTE — Discharge Summary (Signed)
Physician Discharge Summary  Patient ID: [name] MRN: [id number] DOB/AGE: August 25, 1970 44 y.o.  Admit date: 12/15/2014 Discharge date: 12/17/2014  Admission Diagnoses:  Active Problems:   Arthritis of knee, degenerative   Discharge Diagnoses:  Same  Surgeries: Procedure(s): TOTAL KNEE ARTHROPLASTY on 12/15/2014   Consultants:    Discharged Condition: Stable  Hospital Course: [name] is an 44 y.o. female who was admitted 12/15/2014 with a chief complaint of knee pain, and found to have a diagnosis of knee arthritis.  They were brought to the operating room on 12/15/2014 and underwent the above named procedures.  She did well and was dced to home in good condition  Antibiotics given:  Anti-infectives    Start     Dose/Rate Route Frequency Ordered Stop   12/15/14 1700  clindamycin (CLEOCIN) IVPB 600 mg     600 mg 100 mL/hr over 30 Minutes Intravenous Every 6 hours 12/15/14 1623 12/16/14 0001   12/15/14 0910  clindamycin (CLEOCIN) 900 MG/50ML IVPB    Comments:  [name]   : cabinet override      12/15/14 0910 12/15/14 1102   12/15/14 0847  clindamycin (CLEOCIN) IVPB 900 mg  Status:  Discontinued     900 mg 100 mL/hr over 30 Minutes Intravenous On call to O.R. 12/15/14 4098 12/15/14 1611    .  Recent vital signs:  Filed Vitals:   12/17/14 0545  BP: 131/73  Pulse: 97  Temp: 99 F (37.2 C)  Resp: 20    Recent laboratory studies:  Results for orders placed or performed during the hospital encounter of 12/15/14  Glucose, capillary  Result Value Ref Range   Glucose-Capillary 87 65 - 99 mg/dL  CBC  Result Value Ref Range   WBC 11.4 (H) 4.0 - 10.5 K/uL   RBC 4.13 3.87 - 5.11 MIL/uL   Hemoglobin 11.8 (L) 12.0 - 15.0 g/dL   HCT 11.9 14.7 - 82.9 %   MCV 89.1 78.0 - 100.0 fL   MCH 28.6 26.0 - 34.0 pg   MCHC 32.1 30.0 - 36.0 g/dL   RDW 56.2 13.0 - 86.5 %   Platelets 121 (L) 150 - 400 K/uL  Basic metabolic panel  Result Value Ref Range   Sodium  138 135 - 145 mmol/L   Potassium 4.3 3.5 - 5.1 mmol/L   Chloride 102 101 - 111 mmol/L   CO2 29 22 - 32 mmol/L   Glucose, Bld 153 (H) 65 - 99 mg/dL   BUN 7 6 - 20 mg/dL   Creatinine, Ser 7.84 0.44 - 1.00 mg/dL   Calcium 8.6 (L) 8.9 - 10.3 mg/dL   GFR calc non Af Amer >60 >60 mL/min   GFR calc Af Amer >60 >60 mL/min   Anion gap 7 5 - 15  CBC  Result Value Ref Range   WBC 12.9 (H) 4.0 - 10.5 K/uL   RBC 3.99 3.87 - 5.11 MIL/uL   Hemoglobin 11.4 (L) 12.0 - 15.0 g/dL   HCT 69.6 (L) 29.5 - 28.4 %   MCV 88.5 78.0 - 100.0 fL   MCH 28.6 26.0 - 34.0 pg   MCHC 32.3 30.0 - 36.0 g/dL   RDW 13.2 44.0 - 10.2 %   Platelets 118 (L) 150 - 400 K/uL    Discharge Medications:     Medication List    STOP taking these medications        diclofenac sodium 1 % Gel  Commonly known as:  VOLTAREN  HYDROcodone-acetaminophen 10-325 MG tablet  Commonly known as:  NORCO      TAKE these medications        BC HEADACHE POWDER PO  Take 1 Package by mouth 2 (two) times daily as needed (pain).     colestipol 1 G tablet  Commonly known as:  COLESTID  Take 2 tablets (2 g total) by mouth 2 (two) times daily.     docusate sodium 100 MG capsule  Commonly known as:  COLACE  Take 1 capsule (100 mg total) by mouth 2 (two) times daily.     DULoxetine 30 MG capsule  Commonly known as:  CYMBALTA  Take 1 capsule (30 mg total) by mouth daily.     gabapentin 300 MG capsule  Commonly known as:  NEURONTIN  Take 1 capsule (300 mg total) by mouth 3 (three) times daily.     methocarbamol 500 MG tablet  Commonly known as:  ROBAXIN  Take 1 tablet (500 mg total) by mouth every 6 (six) hours as needed for muscle spasms.     oxyCODONE 5 MG immediate release tablet  Commonly known as:  Oxy IR/ROXICODONE  Take 1-2 tablets (5-10 mg total) by mouth every 3 (three) hours as needed for breakthrough pain.     RANITIDINE 75 PO  Take 75 mg by mouth daily.     rivaroxaban 10 MG Tabs tablet  Commonly known as:   XARELTO  Take 1 tablet (10 mg total) by mouth daily with breakfast.        Diagnostic Studies: No results found.  Disposition: 01-Home or Self Care      Discharge Instructions    Call MD / Call 911    Complete by:  As directed   If you experience chest pain or shortness of breath, CALL 911 and be transported to the hospital emergency room.  If you develope a fever above 101 F, pus (white drainage) or increased drainage or redness at the wound, or calf pain, call your surgeon's office.     Call MD / Call 911    Complete by:  As directed   If you experience chest pain or shortness of breath, CALL 911 and be transported to the hospital emergency room.  If you develope a fever above 101 F, pus (white drainage) or increased drainage or redness at the wound, or calf pain, call your surgeon's office.     Constipation Prevention    Complete by:  As directed   Drink plenty of fluids.  Prune juice may be helpful.  You may use a stool softener, such as Colace (over the counter) 100 mg twice a day.  Use MiraLax (over the counter) for constipation as needed.     Constipation Prevention    Complete by:  As directed   Drink plenty of fluids.  Prune juice may be helpful.  You may use a stool softener, such as Colace (over the counter) 100 mg twice a day.  Use MiraLax (over the counter) for constipation as needed.     Diet - low sodium heart healthy    Complete by:  As directed      Diet - low sodium heart healthy    Complete by:  As directed      Discharge instructions    Complete by:  As directed   Weight bearing AS tolerated Keep incision dry INSTRUCTIONS AFTER JOINT REPLACEMENT   Remove items at home which could result in a fall. This includes throw rugs  or furniture in walking pathways ICE to the affected joint every three hours while awake for 30 minutes at a time, for at least the first 3-5 days, and then as needed for pain and swelling.  Continue to use ice for pain and swelling. You may  notice swelling that will progress down to the foot and ankle.  This is normal after surgery.  Elevate your leg when you are not up walking on it.   Continue to use the breathing machine you got in the hospital (incentive spirometer) which will help keep your temperature down.  It is common for your temperature to cycle up and down following surgery, especially at night when you are not up moving around and exerting yourself.  The breathing machine keeps your lungs expanded and your temperature down.   DIET:  As you were doing prior to hospitalization, we recommend a well-balanced diet.  DRESSING / WOUND CARE / SHOWERING  Keep the surgical dressing until follow up.  The dressing is water proof, so you can shower without any extra covering.  IF THE DRESSING FALLS OFF or the wound gets wet inside, change the dressing with sterile gauze.  Please use good hand washing techniques before changing the dressing.  Do not use any lotions or creams on the incision until instructed by your surgeon.    ACTIVITY  Increase activity slowly as tolerated, but follow the weight bearing instructions below.   No driving for 6 weeks or until further direction given by your physician.  You cannot drive while taking narcotics.  No lifting or carrying greater than 10 lbs. until further directed by your surgeon. Avoid periods of inactivity such as sitting longer than an hour when not asleep. This helps prevent blood clots.  You may return to work once you are authorized by your doctor.     WEIGHT BEARING   Weight bearing as tolerated with assist device (Ambrosius, cane, etc) as directed, use it as long as suggested by your surgeon or therapist, typically at least 4-6 weeks.   EXERCISES  Results after joint replacement surgery are often greatly improved when you follow the exercise, range of motion and muscle strengthening exercises prescribed by your doctor. Safety measures are also important to protect the joint from  further injury. Any time any of these exercises cause you to have increased pain or swelling, decrease what you are doing until you are comfortable again and then slowly increase them. If you have problems or questions, call your caregiver or physical therapist for advice.   Rehabilitation is important following a joint replacement. After just a few days of immobilization, the muscles of the leg can become weakened and shrink (atrophy).  These exercises are designed to build up the tone and strength of the thigh and leg muscles and to improve motion. Often times heat used for twenty to thirty minutes before working out will loosen up your tissues and help with improving the range of motion but do not use heat for the first two weeks following surgery (sometimes heat can increase post-operative swelling).   These exercises can be done on a training (exercise) mat, on the floor, on a table or on a bed. Use whatever works the best and is most comfortable for you.    Use music or television while you are exercising so that the exercises are a pleasant break in your day. This will make your life better with the exercises acting as a break in your routine that you  can look forward to.   Perform all exercises about fifteen times, three times per day or as directed.  You should exercise both the operative leg and the other leg as well.   Exercises include:   Quad Sets - Tighten up the muscle on the front of the thigh (Quad) and hold for 5-10 seconds.   Straight Leg Raises - With your knee straight (if you were given a brace, keep it on), lift the leg to 60 degrees, hold for 3 seconds, and slowly lower the leg.  Perform this exercise against resistance later as your leg gets stronger.  Leg Slides: Lying on your back, slowly slide your foot toward your buttocks, bending your knee up off the floor (only go as far as is comfortable). Then slowly slide your foot back down until your leg is flat on the floor again.   Angel Wings: Lying on your back spread your legs to the side as far apart as you can without causing discomfort.  Hamstring Strength:  Lying on your back, push your heel against the floor with your leg straight by tightening up the muscles of your buttocks.  Repeat, but this time bend your knee to a comfortable angle, and push your heel against the floor.  You may put a pillow under the heel to make it more comfortable if necessary.   A rehabilitation program following joint replacement surgery can speed recovery and prevent re-injury in the future due to weakened muscles. Contact your doctor or a physical therapist for more information on knee rehabilitation.    CONSTIPATION  Constipation is defined medically as fewer than three stools per week and severe constipation as less than one stool per week.  Even if you have a regular bowel pattern at home, your normal regimen is likely to be disrupted due to multiple reasons following surgery.  Combination of anesthesia, postoperative narcotics, change in appetite and fluid intake all can affect your bowels.   YOU MUST use at least one of the following options; they are listed in order of increasing strength to get the job done.  They are all available over the counter, and you may need to use some, POSSIBLY even all of these options:    Drink plenty of fluids (prune juice may be helpful) and high fiber foods Colace 100 mg by mouth twice a day  Senokot for constipation as directed and as needed Dulcolax (bisacodyl), take with full glass of water  Miralax (polyethylene glycol) once or twice a day as needed.  If you have tried all these things and are unable to have a bowel movement in the first 3-4 days after surgery call either your surgeon or your primary doctor.    If you experience loose stools or diarrhea, hold the medications until you stool forms back up.  If your symptoms do not get better within 1 week or if they get worse, check with your  doctor.  If you experience "the worst abdominal pain ever" or develop nausea or vomiting, please contact the office immediately for further recommendations for treatment.   ITCHING:  If you experience itching with your medications, try taking only a single pain pill, or even half a pain pill at a time.  You can also use Benadryl over the counter for itching or also to help with sleep.   TED HOSE STOCKINGS:  Use stockings on both legs until for at least 2 weeks or as directed by physician office. They may be removed at  night for sleeping.  MEDICATIONS:  See your medication summary on the "After Visit Summary" that nursing will review with you.  You may have some home medications which will be placed on hold until you complete the course of blood thinner medication.  It is important for you to complete the blood thinner medication as prescribed.  PRECAUTIONS:  If you experience chest pain or shortness of breath - call 911 immediately for transfer to the hospital emergency department.   If you develop a fever greater that 101 F, purulent drainage from wound, increased redness or drainage from wound, foul odor from the wound/dressing, or calf pain - CONTACT YOUR SURGEON.                                                   FOLLOW-UP APPOINTMENTS:  If you do not already have a post-op appointment, please call the office for an appointment to be seen by your surgeon.  Guidelines for how soon to be seen are listed in your "After Visit Summary", but are typically between 1-4 weeks after surgery.  OTHER INSTRUCTIONS:   Knee Replacement:  Do not place pillow under knee, focus on keeping the knee straight while resting. CPM instructions: 0-90 degrees, 2 hours in the morning, 2 hours in the afternoon, and 2 hours in the evening. Place foam block, curve side up under heel at all times except when in CPM or when walking.  DO NOT modify, tear, cut, or change the foam block in any way.  MAKE SURE YOU:  Understand  these instructions.  Get help right away if you are not doing well or get worse.    Thank you for letting us be a part of your medical care team.  It is a privilege we respect greatly.  We hope these instructions will help you stay on track for a fast and full recovery!     Increase activity slowly as tolerated    Complete by:  As directed      Increase activity slowly as tolerated    Complete by:  As directed            Follow-up Information    Please follow up.   Why:  Someone from [place] or the agency they assign for home health will contact you concerning start date and time for therapy.       SignedCammy Copa: DEAN,GREGORY SCOTT 01/13/2015, 5:12 PM

## 2015-01-13 NOTE — Telephone Encounter (Signed)
No show, called and spoke to pt who was pulling into dept parking lot, thought apt was at 945 not 930.  Rescheduled apt for 530 later today.  Becky Saxasey Evangelia Whitaker, LPTA; CBIS [phone]]

## 2015-01-14 ENCOUNTER — Telehealth: Payer: Self-pay | Admitting: Family Medicine

## 2015-01-14 MED ORDER — FLUCONAZOLE 150 MG PO TABS: ORAL_TABLET | ORAL | Status: DC

## 2015-01-14 NOTE — Telephone Encounter (Signed)
Called and spoke with patient to get more details of symptoms. Patient has c/o vaginal itching, and burning with some yellow discharge.Diflucan 250 mg 1 tablet three days apart to be sent into [facility] per facility protocol. Patient verbalized understanding.

## 2015-01-14 NOTE — Telephone Encounter (Signed)
Error, Diflucan 150 mg tablets 1 tablet daily 3 days apart was sent into pharmacy per protocol.

## 2015-01-14 NOTE — Telephone Encounter (Signed)
Pt is needing something for a yeast infection sent into [facility].

## 2015-01-15 ENCOUNTER — Ambulatory Visit (HOSPITAL_COMMUNITY): Payer: Managed Care, Other (non HMO)

## 2015-01-15 DIAGNOSIS — R29898 Other symptoms and signs involving the musculoskeletal system: Secondary | ICD-10-CM

## 2015-01-15 DIAGNOSIS — R2681 Unsteadiness on feet: Secondary | ICD-10-CM

## 2015-01-15 DIAGNOSIS — Z96651 Presence of right artificial knee joint: Secondary | ICD-10-CM

## 2015-01-15 DIAGNOSIS — R6889 Other general symptoms and signs: Secondary | ICD-10-CM

## 2015-01-15 DIAGNOSIS — M79604 Pain in right leg: Secondary | ICD-10-CM

## 2015-01-15 DIAGNOSIS — Z7409 Other reduced mobility: Secondary | ICD-10-CM

## 2015-01-15 DIAGNOSIS — R531 Weakness: Secondary | ICD-10-CM

## 2015-01-15 DIAGNOSIS — M25461 Effusion, right knee: Secondary | ICD-10-CM

## 2015-01-15 NOTE — Therapy (Signed)
[facility] [address] [place], [place], [address] Phone: [phone]   Fax:  [phone]  Physical Therapy Treatment  Patient Details  Name: [name] MRN: [id number] Date of Birth: June 02, 1970 Referring Provider: Dorene GrebeScott Dean  Encounter Date: 01/15/2015      PT End of Session - 01/15/15 1355    Visit Number 9   Number of Visits 12   Date for PT Re-Evaluation 02/23/15   Authorization Type [employer]    Authorization Time Period 90 days unlimited visits    Authorization - Visit Number 9   PT Start Time 1352   PT Stop Time 1434   PT Time Calculation (min) 42 min   Activity Tolerance Patient tolerated treatment well   Behavior During Therapy [facility] for tasks assessed/performed      Past Medical History  Diagnosis Date  . Chronic knee pain   . Chronic back pain   . Menorrhagia 09/09/2012  . Arthritis     Knee  . GERD (gastroesophageal reflux disease)   . Family history of anesthesia complication     Brother and Mother woke up during surgery; Heard and felt could not speak  . Anxiety   . Headache     stress    Past Surgical History  Procedure Laterality Date  . Cesarean section      X2   . Esophagogastroduodenoscopy N/A 12/27/2012    Procedure: ESOPHAGOGASTRODUODENOSCOPY (EGD);  Surgeon: West BaliSandi L Fields, MD;  Location: [facility];  Service: Endoscopy;  Laterality: N/A;  2:30-moved to 13:45 Soledad GerlachLeigh Ann notified pt  . Tubal ligation      along with c-section  . Dilitation & currettage/hystroscopy with thermachoice ablation N/A 02/25/2013    Procedure: DILATATION & CURETTAGE/HYSTEROSCOPY WITH THERMACHOICE ABLATION;  Surgeon: Tilda BurrowJohn V Ferguson, MD;  Location: [facility] ORS;  Service: Gynecology;  Laterality: N/A;  Total Therapy Time:8:54 minutes Temperature:87 degrees  . Endometrial ablation  12/14  . Cholecystectomy N/A 01/17/2013    Procedure: LAPAROSCOPIC CHOLECYSTECTOMY;  Surgeon: Marlane HatcherWilliam S Bradford, MD;  Location: [facility] ORS;  Service:  General;  Laterality: N/A;  . Total knee arthroplasty Right 12/15/2014    Procedure: TOTAL KNEE ARTHROPLASTY;  Surgeon: Cammy CopaScott Gregory Dean, MD;  [facility];  Service: Orthopedics;  Laterality: Right;    There were no vitals filed for this visit.  Visit Diagnosis:  Right leg weakness  Artificial knee joint present, right  Knee joint effusion, right  Decreased strength, endurance, and mobility  Unsteadiness on feet  Pain of right lower extremity      Subjective Assessment - 01/15/15 1354    Subjective Pt stated knee is mainly tight today, pain minimal   Currently in Pain? Yes   Pain Score 2    Pain Location Knee   Pain Orientation Right   Pain Descriptors / Indicators Tightness            [facility] - 01/15/15 0001    Assessment   Medical Diagnosis s/p R TKA    Referring Provider Dorene GrebeScott Dean   Onset Date/Surgical Date 12/15/14   Hand Dominance Right   Next MD Visit August Saucerean 12/01   Prior Therapy Acute only.    Observation/Other Assessments   Focus on Therapeutic Outcomes (FOTO)  56% limited   AROM   Right Knee Extension 2   Right Knee Flexion 116             [facility] Adult PT Treatment/Exercise - 01/15/15 0001  Knee/Hip Exercises: Stretches   Active Hamstring Stretch Both;3 reps;30 seconds   Active Hamstring Stretch Limitations 14 in box   Quad Stretch Right;3 reps;30 seconds   Quad Stretch Limitations prone with rope    Knee: Self-Stretch Limitations knee drives 10x 10" on 12in step   Gastroc Stretch Both;3 reps;30 seconds   Gastroc Stretch Limitations slantboard    Knee/Hip Exercises: Standing   Forward Lunges Both;10 reps   Forward Lunges Limitations 4in step   Terminal Knee Extension Right;15 reps;Theraband   Theraband Level (Terminal Knee Extension) Level 2 (Red)   Terminal Knee Extension Limitations 5" holds   Lateral Step Up Right;15 reps;Hand Hold: 1;Step Height: 4"   Forward Step Up Right;15 reps;Hand Hold: 0;Step Height: 6"   Step  Down Right;10 reps;Hand Hold: 1;Step Height: 6"   Functional Squat 15 reps   SLS Rt and Lt 60" 1st attempt   Other Standing Knee Exercises sidestepping 2RT GTB   Other Standing Knee Exercises Tandem gait 2RT   Knee/Hip Exercises: Supine   Heel Slides AROM;10 reps   Heel Slides Limitations 2-116   Manual Therapy   Manual Therapy Edema management;Joint mobilization;Myofascial release;Soft tissue mobilization   Edema Management retro-massage to reduce edema and pain around R knee    Joint Mobilization Patella mobs all directions and fib/tib mobs complete and instructed to pt and daughter   Soft tissue mobilization Soft tissue mobilization to quads    Myofascial Release Scar tissue massage complete with LE elevated                   PT Short Term Goals - 12/24/14 1822    PT SHORT TERM GOAL #1   Title Pt will demonstrate independence in beginning home exercise program by twos weeks after commencement of therapy, to affirm self-efficacy in work at home to making progress toward goals.   PT SHORT TERM GOAL #2   Title After 2 weeks, pt will describe in detail 3 ways to manage pain/swelling at home to demonstrate greater self-efficacy in self-management of wellness and function.    PT SHORT TERM GOAL #3   Title Pt will ambulate 565ft with LRAD after 4 weeks, to demonstrate improved activity tolerance and improved independence in limited community ambulation.    PT SHORT TERM GOAL #4   Title Pt will demonstrate modified independence in all bedmobility by the second week of therapy to decrease caregiver burden.            PT Long Term Goals - 12/24/14 1824    PT LONG TERM GOAL #1   Title Pt will demonstrate independence in advanced home exercise program by 1 week prior to discharge, to further self-efficacy in continuation of progress toward goals after discharge from therapy.    PT LONG TERM GOAL #2   Title Pt will ambulate 1581ft c LRAD and s exacerbation of pain after 8 weeks,  to demonstrate improved activity tolerance and improved independence in community ambulation.    PT LONG TERM GOAL #3   Title After 8 weeks, pt will improve gait speed by 80% to demonstrate improved indep in functional mobility and to decrease risk of falls in the home.                Plan - 01/15/15 1837    Clinical Impression Statement Continued session focus on improivng functional strengthening, ROM with functional stretches and ROM based actvities and ended session with manual technqiues for edema and pain control.  Functional  strenghtneing is progressing well wtih improved control with stair training with minimal cueing for eccentric control descending.  Improved form noted wiht squats this session following verbal cueing for weight distribution.  Added tandem gait on balance beam for knee stabilty with supervision required and no LOB episodes noted.  Manual techniques complete for edema control prior therex for ROM with improved quad contraction activation noted and improved knee flexion with AROM 2-116 degrees at end of session.  No reoprts of increased pain through session.  FOTO was complete this session with 56% limitation.   PT Next Visit Plan Continue to focus on ROM, stretches and strengthening, manual technqiues for edema control, scar tissue massage and patella mobs.  Progress functional strengthening as able.          Problem List Patient Active Problem List   Diagnosis Date Noted  . Arthritis of knee, degenerative 12/15/2014  . Anxiety as acute reaction to exceptional stress 11/28/2014  . Chondromalacia of right knee 11/28/2014  . Rebound headache 11/06/2014  . Stressful life event affecting family 11/06/2014  . Encounter for long-term opiate analgesic use 09/15/2014  . Tinea versicolor 09/15/2014  . Chronic back pain 06/16/2014  . Arthritis of both knees 06/16/2014  . DUB (dysfunctional uterine bleeding) 02/17/2013  . Family history of colon cancer 12/19/2012  .  Dyspepsia 12/17/2012  . GERD (gastroesophageal reflux disease) 11/03/2012  . Analgesic overuse headache 11/03/2012  . Low back pain 09/09/2012  . Menorrhagia 09/09/2012  . Arthritis 07/24/2012   Becky Sax, LPTA; CBIS [phone]  Juel Burrow 01/15/2015, 6:45 PM  [facility] [address] [place], [place], [address] Phone: [phone]   Fax:  [phone]  Name: [name] MRN: [id number] Date of Birth: 06-20-1970

## 2015-01-18 ENCOUNTER — Telehealth (HOSPITAL_COMMUNITY): Payer: Self-pay

## 2015-01-18 ENCOUNTER — Ambulatory Visit (HOSPITAL_COMMUNITY): Payer: Managed Care, Other (non HMO) | Admitting: Physical Therapy

## 2015-01-18 NOTE — Telephone Encounter (Signed)
Patient canceled for today she is not feeling well °

## 2015-01-20 ENCOUNTER — Ambulatory Visit (HOSPITAL_COMMUNITY): Payer: Managed Care, Other (non HMO)

## 2015-01-20 ENCOUNTER — Ambulatory Visit (HOSPITAL_COMMUNITY): Payer: Managed Care, Other (non HMO) | Admitting: Physical Therapy

## 2015-01-25 ENCOUNTER — Ambulatory Visit (HOSPITAL_COMMUNITY): Payer: Managed Care, Other (non HMO) | Admitting: Physical Therapy

## 2015-01-25 ENCOUNTER — Other Ambulatory Visit: Payer: Self-pay | Admitting: Nurse Practitioner

## 2015-01-25 DIAGNOSIS — R29898 Other symptoms and signs involving the musculoskeletal system: Secondary | ICD-10-CM | POA: Diagnosis not present

## 2015-01-25 DIAGNOSIS — Z7409 Other reduced mobility: Secondary | ICD-10-CM

## 2015-01-25 DIAGNOSIS — R2681 Unsteadiness on feet: Secondary | ICD-10-CM

## 2015-01-25 DIAGNOSIS — M25461 Effusion, right knee: Secondary | ICD-10-CM

## 2015-01-25 DIAGNOSIS — R531 Weakness: Secondary | ICD-10-CM

## 2015-01-25 NOTE — Therapy (Signed)
[facility] [address] [place], [place], [address] Phone: [phone]   Fax:  [phone]  Physical Therapy Treatment  Patient Details  Name: [name] MRN: [id number] Date of Birth: Jul 23, 1970 Referring Provider: Dorene Grebe  Encounter Date: 01/25/2015      PT End of Session - 01/25/15 1207    Visit Number 10   Number of Visits 12   Date for PT Re-Evaluation 02/23/15   Authorization Type [employer]    Authorization Time Period 90 days unlimited visits    PT Start Time 0932   PT Stop Time 1015   PT Time Calculation (min) 43 min   Activity Tolerance Patient tolerated treatment well   Behavior During Therapy [facility] for tasks assessed/performed      Past Medical History  Diagnosis Date  . Chronic knee pain   . Chronic back pain   . Menorrhagia 09/09/2012  . Arthritis     Knee  . GERD (gastroesophageal reflux disease)   . Family history of anesthesia complication     Brother and Mother woke up during surgery; Heard and felt could not speak  . Anxiety   . Headache     stress    Past Surgical History  Procedure Laterality Date  . Cesarean section      X2   . Esophagogastroduodenoscopy N/A 12/27/2012    Procedure: ESOPHAGOGASTRODUODENOSCOPY (EGD);  Surgeon: West Bali, MD;  [facility];  Service: Endoscopy;  Laterality: N/A;  2:30-moved to 13:45 Soledad Gerlach notified pt  . Tubal ligation      along with c-section  . Dilitation & currettage/hystroscopy with thermachoice ablation N/A 02/25/2013    Procedure: DILATATION & CURETTAGE/HYSTEROSCOPY WITH THERMACHOICE ABLATION;  Surgeon: [name], MD;  Location: [facility] ORS;  Service: Gynecology;  Laterality: N/A;  Total Therapy Time:8:54 minutes Temperature:87 degrees  . Endometrial ablation  12/14  . Cholecystectomy N/A 01/17/2013    Procedure: LAPAROSCOPIC CHOLECYSTECTOMY;  Surgeon: Marlane Hatcher, MD;  Location: [facility] ORS;  Service: General;  Laterality: N/A;  .  Total knee arthroplasty Right 12/15/2014    Procedure: TOTAL KNEE ARTHROPLASTY;  Surgeon: Cammy Copa, MD;  Location: [facility] OR;  Service: Orthopedics;  Laterality: Right;    There were no vitals filed for this visit.  Visit Diagnosis:  Right leg weakness  Knee joint effusion, right  Decreased strength, endurance, and mobility  Unsteadiness on feet      Subjective Assessment - 01/25/15 0935    Subjective Pt reports that she is getting over being sick. She states that her knee feels tight today, like it normally does.    Currently in Pain? Yes   Pain Score 2    Pain Location Knee   Pain Orientation Right                         [facility] Adult PT Treatment/Exercise - 01/25/15 0001    Knee/Hip Exercises: Stretches   Active Hamstring Stretch Both;3 reps;30 seconds   Active Hamstring Stretch Limitations 14 in box   Quad Stretch Right;3 reps;30 seconds   Quad Stretch Limitations prone with rope    Knee: Self-Stretch Limitations knee drives 10x 10" on 12in step   Gastroc Stretch Both;3 reps;30 seconds   Gastroc Stretch Limitations slantboard    Knee/Hip Exercises: Standing   Lateral Step Up Right;15 reps;Hand Hold: 1;Step Height: 4"   Forward Step Up Right;15 reps;Hand Hold: 0;Step Height: 6"  Step Down Right;10 reps;Hand Hold: 1;Step Height: 6"   Wall Squat 10 reps   Other Standing Knee Exercises sidestepping 2RT GTB   Other Standing Knee Exercises Tandem gait 2RT   Knee/Hip Exercises: Seated   Stool Scoot - Round Trips 2RT    Manual Therapy   Manual Therapy Edema management;Soft tissue mobilization   Manual therapy comments performed following therex   Edema Management retro-massage to reduce edema and pain around R knee    Soft tissue mobilization Soft tissue mobilization to quads                   PT Short Term Goals - 12/24/14 1822    PT SHORT TERM GOAL #1   Title Pt will demonstrate independence in beginning home exercise program by twos  weeks after commencement of therapy, to affirm self-efficacy in work at home to making progress toward goals.   PT SHORT TERM GOAL #2   Title After 2 weeks, pt will describe in detail 3 ways to manage pain/swelling at home to demonstrate greater self-efficacy in self-management of wellness and function.    PT SHORT TERM GOAL #3   Title Pt will ambulate 55100ft with LRAD after 4 weeks, to demonstrate improved activity tolerance and improved independence in limited community ambulation.    PT SHORT TERM GOAL #4   Title Pt will demonstrate modified independence in all bedmobility by the second week of therapy to decrease caregiver burden.            PT Long Term Goals - 12/24/14 1824    PT LONG TERM GOAL #1   Title Pt will demonstrate independence in advanced home exercise program by 1 week prior to discharge, to further self-efficacy in continuation of progress toward goals after discharge from therapy.    PT LONG TERM GOAL #2   Title Pt will ambulate 152600ft c LRAD and s exacerbation of pain after 8 weeks, to demonstrate improved activity tolerance and improved independence in community ambulation.    PT LONG TERM GOAL #3   Title After 8 weeks, pt will improve gait speed by 80% to demonstrate improved indep in functional mobility and to decrease risk of falls in the home.                Plan - 01/25/15 1207    Clinical Impression Statement Continued with functional strengthening in today's treatment. Wall squats and stool scoots were added to increase strength of quads and hamstrings, pt required verbal cueing for proper form for both exercises, but denied having any pain with the activities. Manual therapy was performed after therex to reduce edema and to reduce tightness of R quads. Pt reported less pain and tightness in her knee following manual therapy.    PT Next Visit Plan Add stepping over hurdles on solid ground and balance beam        Problem List Patient Active Problem  List   Diagnosis Date Noted  . Arthritis of knee, degenerative 12/15/2014  . Anxiety as acute reaction to exceptional stress 11/28/2014  . Chondromalacia of right knee 11/28/2014  . Rebound headache 11/06/2014  . Stressful life event affecting family 11/06/2014  . Encounter for long-term opiate analgesic use 09/15/2014  . Tinea versicolor 09/15/2014  . Chronic back pain 06/16/2014  . Arthritis of both knees 06/16/2014  . DUB (dysfunctional uterine bleeding) 02/17/2013  . Family history of colon cancer 12/19/2012  . Dyspepsia 12/17/2012  . GERD (gastroesophageal reflux disease) 11/03/2012  .  Analgesic overuse headache 11/03/2012  . Low back pain 09/09/2012  . Menorrhagia 09/09/2012  . Arthritis 07/24/2012    Leona Singleton, PT, DPT [phone] 01/25/2015, 12:14 PM  [facility] [address] [place], [place], [address] Phone: [phone]   Fax:  [phone]  Name: [name] MRN: [id number] Date of Birth: 04-Dec-1970

## 2015-01-27 ENCOUNTER — Ambulatory Visit (HOSPITAL_COMMUNITY): Payer: Managed Care, Other (non HMO) | Admitting: Physical Therapy

## 2015-01-27 DIAGNOSIS — M25461 Effusion, right knee: Secondary | ICD-10-CM

## 2015-01-27 DIAGNOSIS — R29898 Other symptoms and signs involving the musculoskeletal system: Secondary | ICD-10-CM | POA: Diagnosis not present

## 2015-01-27 DIAGNOSIS — R531 Weakness: Secondary | ICD-10-CM

## 2015-01-27 DIAGNOSIS — R6889 Other general symptoms and signs: Secondary | ICD-10-CM

## 2015-01-27 DIAGNOSIS — R2681 Unsteadiness on feet: Secondary | ICD-10-CM

## 2015-01-27 DIAGNOSIS — Z7409 Other reduced mobility: Secondary | ICD-10-CM

## 2015-01-27 NOTE — Therapy (Signed)
[facility] [address] [place], [place], [address] Phone: [phone]   Fax:  [phone]  Physical Therapy Treatment (Re-Assessment)  Patient Details  Name: [name] MRN: [id number] Date of Birth: January 08, 1971 Referring Provider: Dorene Grebe  Encounter Date: 01/27/2015      PT End of Session - 01/27/15 1839    Visit Number 11   Number of Visits 17   Date for PT Re-Evaluation 02/24/15   Authorization Type [employer]    Authorization Time Period 90 days unlimited visits    PT Start Time 1346   PT Stop Time 1430   PT Time Calculation (min) 44 min   Activity Tolerance Patient tolerated treatment well   Behavior During Therapy [facility] for tasks assessed/performed      Past Medical History  Diagnosis Date  . Chronic knee pain   . Chronic back pain   . Menorrhagia 09/09/2012  . Arthritis     Knee  . GERD (gastroesophageal reflux disease)   . Family history of anesthesia complication     Brother and Mother woke up during surgery; Heard and felt could not speak  . Anxiety   . Headache     stress    Past Surgical History  Procedure Laterality Date  . Cesarean section      X2   . Esophagogastroduodenoscopy N/A 12/27/2012    Procedure: ESOPHAGOGASTRODUODENOSCOPY (EGD);  Surgeon: West Bali, MD;  [facility];  Service: Endoscopy;  Laterality: N/A;  2:30-moved to 13:45 Soledad Gerlach notified pt  . Tubal ligation      along with c-section  . Dilitation & currettage/hystroscopy with thermachoice ablation N/A 02/25/2013    Procedure: DILATATION & CURETTAGE/HYSTEROSCOPY WITH THERMACHOICE ABLATION;  Surgeon: [name], MD;  Location: [facility];  Service: Gynecology;  Laterality: N/A;  Total Therapy Time:8:54 minutes Temperature:87 degrees  . Endometrial ablation  12/14  . Cholecystectomy N/A 01/17/2013    Procedure: LAPAROSCOPIC CHOLECYSTECTOMY;  Surgeon: Marlane Hatcher, MD;  Location: [facility];  Service: General;   Laterality: N/A;  . Total knee arthroplasty Right 12/15/2014    Procedure: TOTAL KNEE ARTHROPLASTY;  Surgeon: Cammy Copa, MD;  Location: [facility];  Service: Orthopedics;  Laterality: Right;    There were no vitals filed for this visit.  Visit Diagnosis:  Right leg weakness  Knee joint effusion, right  Decreased strength, endurance, and mobility  Unsteadiness on feet      Subjective Assessment - 01/27/15 1349    Subjective Patient reports that she is doing alright today, still getting over being sick and has noticed her knee tends to hurt more when it is cold    Currently in Pain? Yes   Pain Score 1    Pain Location Knee   Pain Orientation Right            [facility] PT Assessment - 01/27/15 0001    Observation/Other Assessments   Observations five time sit to stand 8.1 seconds    AROM   Right Knee Extension 1   Right Knee Flexion 118   Strength   Right Hip Flexion 4+/5   Right Hip Extension 4-/5   Right Hip ABduction 3+/5   Left Hip Flexion 4+/5   Left Hip Extension 4-/5   Left Hip ABduction 4/5   Right Knee Flexion 3+/5   Right Knee Extension 4+/5   Left Knee Flexion 4-/5   Left Knee Extension 5/5   Right Ankle Dorsiflexion 5/5  Left Ankle Dorsiflexion 5/5   6 minute walk test results    Aerobic Endurance Distance Walked 1560   Endurance additional comments ; gait speed 1.74m/s    High Level Balance   High Level Balance Comments TUG 7.0, 6.4, 6.5                      [facility] Adult PT Treatment/Exercise - 01/27/15 0001    Knee/Hip Exercises: Stretches   Active Hamstring Stretch Both;3 reps;30 seconds   Active Hamstring Stretch Limitations 12 nch box    Quad Stretch Right;3 reps;30 seconds   Quad Stretch Limitations prone with rope    Knee: Self-Stretch Limitations knee drives 10x 10" on 12in step   Gastroc Stretch Both;3 reps;30 seconds   Gastroc Stretch Limitations slantboard    Knee/Hip Exercises: Standing   Heel Raises Both;1 set;15  reps   Heel Raises Limitations toe and heel raises                 PT Education - 01/27/15 1838    Education provided Yes   Education Details progress with skilled PT services, plan of care moving forward    Person(s) Educated Patient   Methods Explanation   Comprehension Verbalized understanding          PT Short Term Goals - 01/27/15 1421    PT SHORT TERM GOAL #1   Title Pt will demonstrate independence in beginning home exercise program by twos weeks after commencement of therapy, to affirm self-efficacy in work at home to making progress toward goals.   Status Achieved   PT SHORT TERM GOAL #2   Title After 2 weeks, pt will describe in detail 3 ways to manage pain/swelling at home to demonstrate greater self-efficacy in self-management of wellness and function.    Baseline 11/30- able to successfully name 3 methods- ice, massage, elevation    Status Achieved   PT SHORT TERM GOAL #3   Title Pt will ambulate 551ft with LRAD after 4 weeks, to demonstrate improved activity tolerance and improved independence in limited community ambulation.    Baseline 11/30- 1542ft with no device during    Status Achieved   PT SHORT TERM GOAL #4   Title Pt will demonstrate modified independence in all bedmobility by the second week of therapy to decrease caregiver burden.    Period Weeks   Status Achieved           PT Long Term Goals - 01/27/15 1423    PT LONG TERM GOAL #1   Title Pt will demonstrate independence in advanced home exercise program by 1 week prior to discharge, to further self-efficacy in continuation of progress toward goals after discharge from therapy.    Period Weeks   Status On-going   PT LONG TERM GOAL #2   Title Pt will ambulate 1521ft c LRAD and s exacerbation of pain after 8 weeks, to demonstrate improved activity tolerance and improved independence in community ambulation.    Status Achieved   PT LONG TERM GOAL #3   Title After 8 weeks, pt will  improve gait speed by 80% to demonstrate improved indep in functional mobility and to decrease risk of falls in the home.    Baseline 11/30- gait speed 1.26m/s    Status Achieved   PT LONG TERM GOAL #4   Title Patient will demonstrate at least 4+/5 strength in all tested muscle groups in order to promote knee stability and allow patient to  return to functional tasks with pain 0/10 and no feeling of unsteadiness in knee    Time 6   Period Weeks   Status New               Plan - 01/27/15 1839    Clinical Impression Statement Re-assessment performed today. Patient shows excellent progress with skilled PT services with signficant improvements in gait speed and ambulation, balance, strength, and good progress with ROM. Patient remains extremely motivated to participate with skilled PT services, and was given an updated HEP today. At this time she will benefit from skilled PT services focusing primarily on functional strength and muscle endurance before DC to advanced HEP.    Pt will benefit from skilled therapeutic intervention in order to improve on the following deficits Abnormal gait;Decreased range of motion;Decreased coordination;Difficulty walking;Increased fascial restricitons;Pain;Decreased mobility;Decreased strength;Increased edema;Impaired sensation;Hypomobility;Decreased balance;Decreased activity tolerance   Rehab Potential Good   PT Frequency 2x / week   PT Duration 3 weeks   PT Treatment/Interventions ADLs/Self Care Home Management;Therapeutic exercise;Therapeutic activities;Functional mobility training;Stair training;Gait training;DME Instruction;Balance training;Patient/family education;Passive range of motion   PT Next Visit Plan Add stepping over hurdles on solid ground and balance beam   PT Home Exercise Plan Seated marching, heel slides, quad sets, short arc quads.    Consulted and Agree with Plan of Care Patient        Problem List Patient Active Problem List    Diagnosis Date Noted  . Arthritis of knee, degenerative 12/15/2014  . Anxiety as acute reaction to exceptional stress 11/28/2014  . Chondromalacia of right knee 11/28/2014  . Rebound headache 11/06/2014  . Stressful life event affecting family 11/06/2014  . Encounter for long-term opiate analgesic use 09/15/2014  . Tinea versicolor 09/15/2014  . Chronic back pain 06/16/2014  . Arthritis of both knees 06/16/2014  . DUB (dysfunctional uterine bleeding) 02/17/2013  . Family history of colon cancer 12/19/2012  . Dyspepsia 12/17/2012  . GERD (gastroesophageal reflux disease) 11/03/2012  . Analgesic overuse headache 11/03/2012  . Low back pain 09/09/2012  . Menorrhagia 09/09/2012  . Arthritis 07/24/2012    Physical Therapy Progress Note  Dates of Reporting Period: 12/24/14 to 01/27/15  Objective Reports of Subjective Statement: see above   Objective Measurements: see above   Goal Update: see above   Plan: see above   Reason Skilled Services are Required: focus on functional strength and muscle endurance, develop advanced HEP    Nedra HaiKristen Nazifa Trinka PT, DPT [phone]  [facility] [address] [place], [place], [address] Phone: [phone]   Fax:  [phone]  Name: [name] MRN: [id number] Date of Birth: February 26, 1971

## 2015-01-27 NOTE — Patient Instructions (Signed)
   BRIDGING  While lying on your back, tighten your lower abdominals, squeeze your buttocks and then raise your buttocks off the floor/bed as creating a "Bridge" with your body.  Repeat 10 times, twice a day.    HIP ABDUCTION - SIDELYING  While lying on your side, slowly raise up your top leg to the side. Keep your knee straight and maintain your toes pointed forward the entire time.   The bottom leg can be bent to stabilize your body.  Repeat 10 times, twice a day.    PRONE HIP EXTENSION  While lying face down with your knee straight, slowly raise up leg off the ground.  Repeat 10 times each leg, twice a day.

## 2015-02-01 ENCOUNTER — Ambulatory Visit (HOSPITAL_COMMUNITY): Payer: Managed Care, Other (non HMO) | Attending: Orthopedic Surgery | Admitting: Physical Therapy

## 2015-02-01 DIAGNOSIS — Z7409 Other reduced mobility: Secondary | ICD-10-CM | POA: Insufficient documentation

## 2015-02-01 DIAGNOSIS — R29898 Other symptoms and signs involving the musculoskeletal system: Secondary | ICD-10-CM | POA: Diagnosis present

## 2015-02-01 DIAGNOSIS — R531 Weakness: Secondary | ICD-10-CM

## 2015-02-01 DIAGNOSIS — M25461 Effusion, right knee: Secondary | ICD-10-CM | POA: Insufficient documentation

## 2015-02-01 DIAGNOSIS — Z96651 Presence of right artificial knee joint: Secondary | ICD-10-CM | POA: Diagnosis present

## 2015-02-01 DIAGNOSIS — M79604 Pain in right leg: Secondary | ICD-10-CM | POA: Insufficient documentation

## 2015-02-01 DIAGNOSIS — R2681 Unsteadiness on feet: Secondary | ICD-10-CM | POA: Diagnosis present

## 2015-02-01 NOTE — Therapy (Signed)
[facility] [address] [place], [place], [address] Phone: [phone]   Fax:  [phone]  Physical Therapy Treatment  Patient Details  Name: [name] MRN: [id number] Date of Birth: May 02, 1970 Referring Provider: Dorene GrebeScott Dean  Encounter Date: 02/01/2015      PT End of Session - 02/01/15 1547    Visit Number 12   Number of Visits 17   Date for PT Re-Evaluation 02/24/15   Authorization Type [employer]    Authorization Time Period 90 days unlimited visits    PT Start Time 1430   PT Stop Time 1520   PT Time Calculation (min) 50 min   Activity Tolerance Patient tolerated treatment well   Behavior During Therapy [facility] for tasks assessed/performed      Past Medical History  Diagnosis Date  . Chronic knee pain   . Chronic back pain   . Menorrhagia 09/09/2012  . Arthritis     Knee  . GERD (gastroesophageal reflux disease)   . Family history of anesthesia complication     Brother and Mother woke up during surgery; Heard and felt could not speak  . Anxiety   . Headache     stress    Past Surgical History  Procedure Laterality Date  . Cesarean section      X2   . Esophagogastroduodenoscopy N/A 12/27/2012    Procedure: ESOPHAGOGASTRODUODENOSCOPY (EGD);  Surgeon: West BaliSandi L Fields, MD;  [facility];  Service: Endoscopy;  Laterality: N/A;  2:30-moved to 13:45 Soledad GerlachLeigh Ann notified pt  . Tubal ligation      along with c-section  . Dilitation & currettage/hystroscopy with thermachoice ablation N/A 02/25/2013    Procedure: DILATATION & CURETTAGE/HYSTEROSCOPY WITH THERMACHOICE ABLATION;  Surgeon: Tilda BurrowJohn V Ferguson, MD;  Location: [facility];  Service: Gynecology;  Laterality: N/A;  Total Therapy Time:8:54 minutes Temperature:87 degrees  . Endometrial ablation  12/14  . Cholecystectomy N/A 01/17/2013    Procedure: LAPAROSCOPIC CHOLECYSTECTOMY;  Surgeon: Marlane HatcherWilliam S Bradford, MD;  Location: [facility];  Service: General;  Laterality: N/A;  .  Total knee arthroplasty Right 12/15/2014    Procedure: TOTAL KNEE ARTHROPLASTY;  Surgeon: Cammy CopaScott Gregory Dean, MD;  [facility];  Service: Orthopedics;  Laterality: Right;    There were no vitals filed for this visit.  Visit Diagnosis:  Right leg weakness  Knee joint effusion, right  Decreased strength, endurance, and mobility  Unsteadiness on feet      Subjective Assessment - 02/01/15 1434    Subjective Pt reports that she saw her surgeon on Thursday last week, he is very happy with her progress and wants her to just finish up her original 6 weeks and then end PT.    Currently in Pain? No/denies   Pain Score 0-No pain                         [facility] Adult PT Treatment/Exercise - 02/01/15 0001    Knee/Hip Exercises: Stretches   Active Hamstring Stretch Both;3 reps;30 seconds   Active Hamstring Stretch Limitations 12 nch box    Knee: Self-Stretch Limitations knee drives 10x 10" on 12in step   Gastroc Stretch Both;3 reps;30 seconds   Gastroc Stretch Limitations slantboard    Knee/Hip Exercises: Standing   Heel Raises 20 reps   Heel Raises Limitations off of step   Lateral Step Up Right;15 reps;Hand Hold: 1;Step Height: 6"   Lateral Step Up Limitations 4" + airex  Forward Step Up Right;15 reps;Hand Hold: 0;Step Height: 6"   Forward Step Up Limitations 4" + airex   Step Down 15 reps;Step Height: 6"   Wall Squat 15 reps   Other Standing Knee Exercises sidestepping 366RT GTB   Other Standing Knee Exercises tandem gait, stepping over hurdles x 2RT on balance beam   Knee/Hip Exercises: Seated   Stool Scoot - Round Trips 2RT    Manual Therapy   Manual Therapy Edema management;Soft tissue mobilization   Manual therapy comments performed following therex   Edema Management retro-massage to reduce edema and pain around R knee    Soft tissue mobilization Soft tissue mobilization to quads                   PT Short Term Goals - 01/27/15 1421    PT  SHORT TERM GOAL #1   Title Pt will demonstrate independence in beginning home exercise program by twos weeks after commencement of therapy, to affirm self-efficacy in work at home to making progress toward goals.   Status Achieved   PT SHORT TERM GOAL #2   Title After 2 weeks, pt will describe in detail 3 ways to manage pain/swelling at home to demonstrate greater self-efficacy in self-management of wellness and function.    Baseline 11/30- able to successfully name 3 methods- ice, massage, elevation    Status Achieved   PT SHORT TERM GOAL #3   Title Pt will ambulate 572ft with LRAD after 4 weeks, to demonstrate improved activity tolerance and improved independence in limited community ambulation.    Baseline 11/30- 158ft with no device during    Status Achieved   PT SHORT TERM GOAL #4   Title Pt will demonstrate modified independence in all bedmobility by the second week of therapy to decrease caregiver burden.    Period Weeks   Status Achieved           PT Long Term Goals - 01/27/15 1423    PT LONG TERM GOAL #1   Title Pt will demonstrate independence in advanced home exercise program by 1 week prior to discharge, to further self-efficacy in continuation of progress toward goals after discharge from therapy.    Period Weeks   Status On-going   PT LONG TERM GOAL #2   Title Pt will ambulate 1536ft c LRAD and s exacerbation of pain after 8 weeks, to demonstrate improved activity tolerance and improved independence in community ambulation.    Status Achieved   PT LONG TERM GOAL #3   Title After 8 weeks, pt will improve gait speed by 80% to demonstrate improved indep in functional mobility and to decrease risk of falls in the home.    Baseline 11/30- gait speed 1.61m/s    Status Achieved   PT LONG TERM GOAL #4   Title Patient will demonstrate at least 4+/5 strength in all tested muscle groups in order to promote knee stability and allow patient to return to functional tasks with  pain 0/10 and no feeling of unsteadiness in knee    Time 6   Period Weeks   Status New               Plan - 02/01/15 1547    Clinical Impression Statement Progressed step ups with addition of airex pad today, pt was able to complete the exercise without UE support and with no c/o pain. Stepping over hurdles was added for balance training and to improve knee flexion, pt required verbal  cueing to prevent circumduction of RLE. Pt was able to complete hurdles on solid ground and on balance beam with no LOB. Treatment session ended with manual therapy to reduce edema and to decrease tightness in lateral quads. Pt and boyfriend were educated on massage techniques to continue at home to decrease trigger points and soft tissue tightness in lateral quads.         Problem List Patient Active Problem List   Diagnosis Date Noted  . Arthritis of knee, degenerative 12/15/2014  . Anxiety as acute reaction to exceptional stress 11/28/2014  . Chondromalacia of right knee 11/28/2014  . Rebound headache 11/06/2014  . Stressful life event affecting family 11/06/2014  . Encounter for long-term opiate analgesic use 09/15/2014  . Tinea versicolor 09/15/2014  . Chronic back pain 06/16/2014  . Arthritis of both knees 06/16/2014  . DUB (dysfunctional uterine bleeding) 02/17/2013  . Family history of colon cancer 12/19/2012  . Dyspepsia 12/17/2012  . GERD (gastroesophageal reflux disease) 11/03/2012  . Analgesic overuse headache 11/03/2012  . Low back pain 09/09/2012  . Menorrhagia 09/09/2012  . Arthritis 07/24/2012    Leona Singleton, PT, DPT [phone] 02/01/2015, 3:51 PM  [facility] [address] [place], [place], [address] Phone: [phone]   Fax:  [phone]  Name: [name] MRN: [id number] Date of Birth: 10/29/1970

## 2015-02-03 ENCOUNTER — Ambulatory Visit (HOSPITAL_COMMUNITY): Payer: Managed Care, Other (non HMO)

## 2015-02-03 DIAGNOSIS — Z96651 Presence of right artificial knee joint: Secondary | ICD-10-CM

## 2015-02-03 DIAGNOSIS — M79604 Pain in right leg: Secondary | ICD-10-CM

## 2015-02-03 DIAGNOSIS — R29898 Other symptoms and signs involving the musculoskeletal system: Secondary | ICD-10-CM | POA: Diagnosis not present

## 2015-02-03 DIAGNOSIS — R2681 Unsteadiness on feet: Secondary | ICD-10-CM

## 2015-02-03 DIAGNOSIS — R531 Weakness: Secondary | ICD-10-CM

## 2015-02-03 DIAGNOSIS — Z7409 Other reduced mobility: Secondary | ICD-10-CM

## 2015-02-03 DIAGNOSIS — M25461 Effusion, right knee: Secondary | ICD-10-CM

## 2015-02-03 DIAGNOSIS — R6889 Other general symptoms and signs: Secondary | ICD-10-CM

## 2015-02-03 NOTE — Therapy (Signed)
[facility] [address] [place], [place], [address] Phone: [phone]   Fax:  [phone]  Physical Therapy Treatment  Patient Details  Name: [name] MRN: [id number] Date of Birth: 1970-04-20 Referring Provider: Dorene Grebe  Encounter Date: 02/03/2015      PT End of Session - 02/03/15 1601    Visit Number 13   Number of Visits 17   Date for PT Re-Evaluation 02/24/15   Authorization Type [employer]    Authorization Time Period 90 days unlimited visits    PT Start Time 1516   PT Stop Time 1600   PT Time Calculation (min) 44 min   Activity Tolerance Patient tolerated treatment well   Behavior During Therapy [facility] for tasks assessed/performed      Past Medical History  Diagnosis Date  . Chronic knee pain   . Chronic back pain   . Menorrhagia 09/09/2012  . Arthritis     Knee  . GERD (gastroesophageal reflux disease)   . Family history of anesthesia complication     Brother and Mother woke up during surgery; Heard and felt could not speak  . Anxiety   . Headache     stress    Past Surgical History  Procedure Laterality Date  . Cesarean section      X2   . Esophagogastroduodenoscopy N/A 12/27/2012    Procedure: ESOPHAGOGASTRODUODENOSCOPY (EGD);  Surgeon: West Bali, MD;  Location: [facility];  Service: Endoscopy;  Laterality: N/A;  2:30-moved to 13:45 Soledad Gerlach notified pt  . Tubal ligation      along with c-section  . Dilitation & currettage/hystroscopy with thermachoice ablation N/A 02/25/2013    Procedure: DILATATION & CURETTAGE/HYSTEROSCOPY WITH THERMACHOICE ABLATION;  Surgeon: [name], MD;  Location: [facility] ORS;  Service: Gynecology;  Laterality: N/A;  Total Therapy Time:8:54 minutes Temperature:87 degrees  . Endometrial ablation  12/14  . Cholecystectomy N/A 01/17/2013    Procedure: LAPAROSCOPIC CHOLECYSTECTOMY;  Surgeon: Marlane Hatcher, MD;  Location: [facility] ORS;  Service: General;  Laterality: N/A;  .  Total knee arthroplasty Right 12/15/2014    Procedure: TOTAL KNEE ARTHROPLASTY;  Surgeon: Cammy Copa, MD;  Location: [facility] OR;  Service: Orthopedics;  Laterality: Right;    There were no vitals filed for this visit.  Visit Diagnosis:  Right leg weakness  Knee joint effusion, right  Decreased strength, endurance, and mobility  Unsteadiness on feet  Artificial knee joint present, right  Pain of right lower extremity      Subjective Assessment - 02/03/15 1520    Subjective Pt states she feels she is making great progress, feels ready for discharge next session.   Patient Stated Goals Improve mobility back to baseline, improve independence.    Currently in Pain? No/denies           [facility] Adult PT Treatment/Exercise - 02/03/15 0001    Knee/Hip Exercises: Stretches   Active Hamstring Stretch Both;3 reps;30 seconds   Active Hamstring Stretch Limitations 12 nch box    Gastroc Stretch Both;3 reps;30 seconds   Gastroc Stretch Limitations slantboard    Knee/Hip Exercises: Standing   Lateral Step Up Right;15 reps;Hand Hold: 1;Step Height: 6"   Lateral Step Up Limitations 6in + airex   Forward Step Up Right;15 reps;Hand Hold: 0;Step Height: 6"   Forward Step Up Limitations 6in + airex   Step Down 15 reps;Step Height: 6"   Step Down Limitations 6in + airex   Wall Squat 15 reps;5  seconds   Lunge Walking - Round Trips 1RT   SLS with Vectors 5x5" on airex   Other Standing Knee Exercises tandem and sidestepping on balance beam 2RT; sidestep on GRT 2RT   Knee/Hip Exercises: Seated   Stool Scoot - Round Trips 2RT   Rt LE only   Manual Therapy   Manual Therapy Edema management;Soft tissue mobilization   Manual therapy comments performed following therex   Edema Management retro-massage to reduce edema and pain around R knee    Soft tissue mobilization Soft tissue mobilization to quads                   PT Short Term Goals - 01/27/15 1421    PT SHORT TERM GOAL #1    Title Pt will demonstrate independence in beginning home exercise program by twos weeks after commencement of therapy, to affirm self-efficacy in work at home to making progress toward goals.   Status Achieved   PT SHORT TERM GOAL #2   Title After 2 weeks, pt will describe in detail 3 ways to manage pain/swelling at home to demonstrate greater self-efficacy in self-management of wellness and function.    Baseline 11/30- able to successfully name 3 methods- ice, massage, elevation    Status Achieved   PT SHORT TERM GOAL #3   Title Pt will ambulate 54ft with LRAD after 4 weeks, to demonstrate improved activity tolerance and improved independence in limited community ambulation.    Baseline 11/30- 155ft with no device during    Status Achieved   PT SHORT TERM GOAL #4   Title Pt will demonstrate modified independence in all bedmobility by the second week of therapy to decrease caregiver burden.    Period Weeks   Status Achieved           PT Long Term Goals - 01/27/15 1423    PT LONG TERM GOAL #1   Title Pt will demonstrate independence in advanced home exercise program by 1 week prior to discharge, to further self-efficacy in continuation of progress toward goals after discharge from therapy.    Period Weeks   Status On-going   PT LONG TERM GOAL #2   Title Pt will ambulate 1563ft c LRAD and s exacerbation of pain after 8 weeks, to demonstrate improved activity tolerance and improved independence in community ambulation.    Status Achieved   PT LONG TERM GOAL #3   Title After 8 weeks, pt will improve gait speed by 80% to demonstrate improved indep in functional mobility and to decrease risk of falls in the home.    Baseline 11/30- gait speed 1.9m/s    Status Achieved   PT LONG TERM GOAL #4   Title Patient will demonstrate at least 4+/5 strength in all tested muscle groups in order to promote knee stability and allow patient to return to functional tasks with pain 0/10 and no  feeling of unsteadiness in knee    Time 6   Period Weeks   Status New               Plan - 02/03/15 1607    Clinical Impression Statement Progressed functional strengthening on airex pad today.  Increased step height and added vector stance for hip stability with therapist facilitation for proper form.  Improved form with hurdles with no circumduction following verbal cueing.  Ended session wth manual techniques to reduce edema and decrease tightness lateral quadriceps.  No reports of increased pain through session.  PT Next Visit Plan Reassess next session for probable discharge.          Problem List Patient Active Problem List   Diagnosis Date Noted  . Arthritis of knee, degenerative 12/15/2014  . Anxiety as acute reaction to exceptional stress 11/28/2014  . Chondromalacia of right knee 11/28/2014  . Rebound headache 11/06/2014  . Stressful life event affecting family 11/06/2014  . Encounter for long-term opiate analgesic use 09/15/2014  . Tinea versicolor 09/15/2014  . Chronic back pain 06/16/2014  . Arthritis of both knees 06/16/2014  . DUB (dysfunctional uterine bleeding) 02/17/2013  . Family history of colon cancer 12/19/2012  . Dyspepsia 12/17/2012  . GERD (gastroesophageal reflux disease) 11/03/2012  . Analgesic overuse headache 11/03/2012  . Low back pain 09/09/2012  . Menorrhagia 09/09/2012  . Arthritis 07/24/2012   Becky Saxasey Desma Wilkowski, LPTA; CBIS [phone]   Juel BurrowCockerham, Emanuela Runnion Jo 02/03/2015, 4:18 PM  [facility] [address] [place], [place], [address] Phone: [phone]   Fax:  [phone]  Name: Eilleen Kempfammy M Waller MRN: [id number] Date of Birth: 09/10/1970

## 2015-02-09 ENCOUNTER — Encounter (HOSPITAL_COMMUNITY): Payer: Managed Care, Other (non HMO) | Admitting: Physical Therapy

## 2015-02-11 ENCOUNTER — Encounter (HOSPITAL_COMMUNITY): Payer: Managed Care, Other (non HMO) | Admitting: Physical Therapy

## 2015-02-16 ENCOUNTER — Encounter (HOSPITAL_COMMUNITY): Payer: Managed Care, Other (non HMO) | Admitting: Physical Therapy

## 2015-02-18 ENCOUNTER — Encounter (HOSPITAL_COMMUNITY): Payer: Managed Care, Other (non HMO) | Admitting: Physical Therapy

## 2015-02-23 ENCOUNTER — Encounter (HOSPITAL_COMMUNITY): Payer: Managed Care, Other (non HMO)

## 2015-02-25 ENCOUNTER — Encounter (HOSPITAL_COMMUNITY): Payer: Managed Care, Other (non HMO)

## 2015-03-02 ENCOUNTER — Encounter (HOSPITAL_COMMUNITY): Payer: Managed Care, Other (non HMO) | Admitting: Physical Therapy

## 2015-03-04 ENCOUNTER — Encounter (HOSPITAL_COMMUNITY): Payer: Managed Care, Other (non HMO) | Admitting: Physical Therapy

## 2015-03-08 ENCOUNTER — Other Ambulatory Visit: Payer: Self-pay | Admitting: Family Medicine

## 2015-03-09 ENCOUNTER — Encounter (HOSPITAL_COMMUNITY): Payer: Managed Care, Other (non HMO) | Admitting: Physical Therapy

## 2015-03-11 ENCOUNTER — Encounter (HOSPITAL_COMMUNITY): Payer: Managed Care, Other (non HMO) | Admitting: Physical Therapy

## 2015-03-16 ENCOUNTER — Encounter (HOSPITAL_COMMUNITY): Payer: Managed Care, Other (non HMO) | Admitting: Physical Therapy

## 2015-03-18 ENCOUNTER — Encounter (HOSPITAL_COMMUNITY): Payer: Managed Care, Other (non HMO) | Admitting: Physical Therapy

## 2015-03-22 ENCOUNTER — Ambulatory Visit (INDEPENDENT_AMBULATORY_CARE_PROVIDER_SITE_OTHER): Payer: 59 | Admitting: Family Medicine

## 2015-03-22 ENCOUNTER — Encounter: Payer: Self-pay | Admitting: Family Medicine

## 2015-03-22 VITALS — Temp 98.2°F | Ht 66.0 in | Wt 188.2 lb

## 2015-03-22 DIAGNOSIS — Z13220 Encounter for screening for lipoid disorders: Secondary | ICD-10-CM

## 2015-03-22 DIAGNOSIS — L259 Unspecified contact dermatitis, unspecified cause: Secondary | ICD-10-CM | POA: Diagnosis not present

## 2015-03-22 DIAGNOSIS — Z79891 Long term (current) use of opiate analgesic: Secondary | ICD-10-CM

## 2015-03-22 DIAGNOSIS — M549 Dorsalgia, unspecified: Secondary | ICD-10-CM | POA: Diagnosis not present

## 2015-03-22 DIAGNOSIS — G8929 Other chronic pain: Secondary | ICD-10-CM

## 2015-03-22 DIAGNOSIS — K219 Gastro-esophageal reflux disease without esophagitis: Secondary | ICD-10-CM | POA: Diagnosis not present

## 2015-03-22 MED ORDER — METHYLPREDNISOLONE ACETATE 40 MG/ML IJ SUSP
40.0000 mg | Freq: Once | INTRAMUSCULAR | Status: AC
  Administered 2015-03-22: 40 mg via INTRAMUSCULAR

## 2015-03-22 MED ORDER — HYDROCODONE-ACETAMINOPHEN 10-325 MG PO TABS: ORAL_TABLET | ORAL | Status: DC

## 2015-03-22 MED ORDER — PREDNISONE 20 MG PO TABS: ORAL_TABLET | ORAL | Status: DC

## 2015-03-22 MED ORDER — PANTOPRAZOLE SODIUM 40 MG PO TBEC: 40.0000 mg | DELAYED_RELEASE_TABLET | Freq: Every day | ORAL | Status: DC

## 2015-03-22 NOTE — Progress Notes (Signed)
   Subjective:    Patient ID: [name], female    DOB: 1970/07/01, 45 y.o.   MRN: [id number]  HPI Patient arrives with c/o possible poison oak in her eyes. She relates that she started feeling itching on arms hands and then on the face with swelling around the face this concerned her. She comes in today because she does not one her eyes to swell shut  She also asked that while she is present that we do her pain medication checkup. She denies overusing or misusing the pain medicine. She is requesting refills. Patient also has history of hyperlipidemia she tries to eat healthy she does need to check a lipid profile. Patient relates her reflux is doing well on her current medication she does try to watch her diet She states her moods are doing good and her sciatica is doing well patient may continue gabapentin may continue Cymbalta   Review of Systems  Constitutional: Negative for activity change and appetite change.  Gastrointestinal: Negative for vomiting and abdominal pain.  Neurological: Negative for weakness.  Psychiatric/Behavioral: Negative for confusion.       Objective:   Physical Exam  Constitutional: She appears well-nourished. No distress.  HENT:  Head: Normocephalic.  Cardiovascular: Normal rate, regular rhythm and normal heart sounds.   No murmur heard. Pulmonary/Chest: Effort normal and breath sounds normal.  Musculoskeletal: She exhibits no edema.  Lymphadenopathy:    She has no cervical adenopathy.  Neurological: She is alert.  Psychiatric: Her behavior is normal.  Vitals reviewed.   25 minutes was spent with the patient. Greater than half the time was spent in discussion and answering questions and counseling regarding the issues that the patient came in for today.       Assessment & Plan:  1. Encounter for long-term opiate analgesic use The patient was seen today as part of a comprehensive visit regarding pain control. Patient's compliance with the  medication as well as discussion regarding effectiveness was completed. Prescriptions were written. Patient was advised to follow-up in 3 months. The patient was assessed for any signs of severe side effects. The patient was advised to take the medicine as directed and to report to [place] if any side effect issues.   2. Chronic back pain  her pain medication was increased to 50 tablets per month she was given 3 scripts she is instructed to follow-up with Rayfield Citizen or myself in 3 months.  3. Gastroesophageal reflux disease without esophagitis Reflux doing well on medication continue current medication  4. Screening cholesterol level History hyperlipidemia check lipid profile healthy diet recommended - Lipid panel  5. Contact dermatitis Shot of steroids prednisone taper follow-up of problems - methylPREDNISolone acetate (DEPO-MEDROL) injection 40 mg; Inject 1 mL (40 mg total) into the muscle once.

## 2015-04-05 ENCOUNTER — Telehealth: Payer: Self-pay | Admitting: Family Medicine

## 2015-04-05 MED ORDER — FLUCONAZOLE 200 MG PO TABS: ORAL_TABLET | ORAL | Status: DC

## 2015-04-05 NOTE — Telephone Encounter (Signed)
Diflucan 200 mg 1 daily 7 days, this should get better fairly quickly

## 2015-04-05 NOTE — Telephone Encounter (Signed)
Pt feels she has thrush from the prednisone she was issued Pt states she has a white film on tongue with redness (raw feeling) to the back  Of her tongue, throat starting to bother her now. Started coming up  Last Tuesday (She thought she had burnt her tongue initially) can she get something  To help with this   [facility]

## 2015-04-05 NOTE — Telephone Encounter (Signed)
Spoke with patient to get more details of symptoms. Patient stated that thrush started after round of prednisone. Tongue has a white film covering, some throat irration, is currently afebrile and no other symptoms. Has tried gargling warm salt water and, yogurt. Please advise?

## 2015-04-05 NOTE — Telephone Encounter (Signed)
Called patient and informed her per Dr.Scott Luking- We are sending in Diflucan 200 mg 1 daily for 7 days to [facility].Symptoms should resolve fairly quickly. Patient verbalized understanding.

## 2015-05-01 ENCOUNTER — Other Ambulatory Visit: Payer: Self-pay | Admitting: Family Medicine

## 2015-05-01 ENCOUNTER — Other Ambulatory Visit: Payer: Self-pay | Admitting: Nurse Practitioner

## 2015-06-18 ENCOUNTER — Encounter: Payer: Self-pay | Admitting: Nurse Practitioner

## 2015-06-18 ENCOUNTER — Ambulatory Visit (INDEPENDENT_AMBULATORY_CARE_PROVIDER_SITE_OTHER): Payer: 59 | Admitting: Nurse Practitioner

## 2015-06-18 VITALS — BP 122/88 | HR 82 | Ht 66.0 in | Wt 185.2 lb

## 2015-06-18 DIAGNOSIS — F419 Anxiety disorder, unspecified: Secondary | ICD-10-CM | POA: Diagnosis not present

## 2015-06-18 DIAGNOSIS — Z79891 Long term (current) use of opiate analgesic: Secondary | ICD-10-CM

## 2015-06-18 DIAGNOSIS — R5383 Other fatigue: Secondary | ICD-10-CM

## 2015-06-18 DIAGNOSIS — K580 Irritable bowel syndrome with diarrhea: Secondary | ICD-10-CM

## 2015-06-18 DIAGNOSIS — F411 Generalized anxiety disorder: Secondary | ICD-10-CM

## 2015-06-18 DIAGNOSIS — F430 Acute stress reaction: Principal | ICD-10-CM

## 2015-06-18 MED ORDER — DULOXETINE HCL 60 MG PO CPEP: 60.0000 mg | ORAL_CAPSULE | Freq: Every day | ORAL | Status: DC

## 2015-06-18 MED ORDER — HYDROCODONE-ACETAMINOPHEN 10-325 MG PO TABS: ORAL_TABLET | ORAL | Status: DC

## 2015-06-18 NOTE — Progress Notes (Signed)
Subjective: This patient was seen today for chronic pain  The medication list was reviewed and updated.   -Compliance with medication: yes  - Number patient states they take daily: 1-2 per day; only takes for severe pain  -when was the last dose patient took?  2 days ago  The patient was advised the importance of maintaining medication and not using illegal substances with these.  Refills needed: yes  The patient was educated that we can provide 3 monthly scripts for their medication, it is their responsibility to follow the instructions.  Side effects or complications from medications: none  Patient is aware that pain medications are meant to minimize the severity of the pain to allow their pain levels to improve to allow for better function. They are aware of that pain medications cannot totally remove their pain.  Due for UDT ( at least once per year) : today  Chronic back pain; allows her to function; makes it tolerable  [place] CSR: reviewed registry.  Continues to have issues with diarrhea which is worse with stress. Has had problems for a couple of years. Takes occasional 1/2 Colestid 1 gm tablet which helps but cannot take more due to constipation. Also having increased fatigue due to working new shift at her job. Continues to have significant personal stress.    Objective:   BP 122/88 mmHg  Pulse 82  Ht 5\' 6"  (1.676 m)  Wt 185 lb 3.2 oz (84.006 kg)  BMI 29.91 kg/m2  SpO2 95%  LMP 05/18/2015 NAD. Alert, oriented. Lungs clear. Heart RRR.   Assessment:  Problem List Items Addressed This Visit      Digestive   Irritable bowel syndrome with diarrhea     Other   Anxiety as acute reaction to exceptional stress - Primary   Relevant Medications   DULoxetine (CYMBALTA) 60 MG capsule   Encounter for long-term opiate analgesic use   Relevant Orders   ToxASSURE Select 13 (MW), Urine    Other Visit Diagnoses    Other fatigue          Plan:  Meds ordered this encounter   Medications  . DULoxetine (CYMBALTA) 60 MG capsule    Sig: Take 1 capsule (60 mg total) by mouth daily.    Dispense:  30 capsule    Refill:  2    Order Specific Question:  Supervising Provider    Answer:  Merlyn AlbertLUKING, WILLIAM S [[id number]]  . DISCONTD: HYDROcodone-acetaminophen (NORCO) 10-325 MG tablet    Sig: 1/2 to 1 bid prn    Dispense:  50 tablet    Refill:  0    Order Specific Question:  Supervising Provider    Answer:  Merlyn AlbertLUKING, WILLIAM S [[id number]]  . DISCONTD: HYDROcodone-acetaminophen (NORCO) 10-325 MG tablet    Sig: 1/2 to 1 bid prn    Dispense:  50 tablet    Refill:  0    May fill 30 days from 06/18/15    Order Specific Question:  Supervising Provider    Answer:  Merlyn AlbertLUKING, WILLIAM S [[id number]]  . HYDROcodone-acetaminophen (NORCO) 10-325 MG tablet    Sig: 1/2 to 1 bid prn    Dispense:  50 tablet    Refill:  0    May fill 60 days from 06/18/15    Order Specific Question:  Supervising Provider    Answer:  Merlyn AlbertLUKING, WILLIAM S [[id number]]   Increase Cymbalta to 60 mg. Continue small doses of Colestid for diarrhea. Also recommend bowel probiotic, stress reduction and dietary  measures.  Return in about 3 months (around 09/17/2015) for recheck. Call back sooner if no improvement.

## 2015-06-18 NOTE — Patient Instructions (Signed)
Align as directed  Irritable Bowel Syndrome, Adult Irritable bowel syndrome (IBS) is not one specific disease. It is a group of symptoms that affects the organs responsible for digestion (gastrointestinal or GI tract).  To regulate how your GI tract works, your body sends signals back and forth between your intestines and your brain. If you have IBS, there may be a problem with these signals. As a result, your GI tract does not function normally. Your intestines may become more sensitive and overreact to certain things. This is especially true when you eat certain foods or when you are under stress.  There are four types of IBS. These may be determined based on the consistency of your stool:   IBS with diarrhea.   IBS with constipation.   Mixed IBS.   Unsubtyped IBS.  It is important to know which type of IBS you have. Some treatments are more likely to be helpful for certain types of IBS.  CAUSES  The exact cause of IBS is not known. RISK FACTORS You may have a higher risk of IBS if:  You are a woman.  You are younger than 47 years old.  You have a family history of IBS.  You have mental health problems.  You have had bacterial infection of your GI tract. SIGNS AND SYMPTOMS  Symptoms of IBS vary from person to person. The main symptom is abdominal pain or discomfort. Additional symptoms usually include one or more of the following:   Diarrhea, constipation, or both.   Abdominal swelling or bloating.   Feeling full or sick after eating a small or regular-size meal.   Frequent gas.   Mucus in the stool.   A feeling of having more stool left after a bowel movement.  Symptoms tend to come and go. They may be associated with stress, psychiatric conditions, or nothing at all.  DIAGNOSIS  There is no specific test to diagnose IBS. Your health care provider will make a diagnosis based on a physical exam, medical history, and your symptoms. You may have other tests to  rule out other conditions that may be causing your symptoms. These may include:   Blood tests.   X-rays.   CT scan.  Endoscopy and colonoscopy. This is a test in which your GI tract is viewed with a long, thin, flexible tube. TREATMENT There is no cure for IBS, but treatment can help relieve symptoms. IBS treatment often includes:   Changes to your diet, such as:  Eating more fiber.  Avoiding foods that cause symptoms.  Drinking more water.  Eating regular, medium-sized portioned meals.  Medicines. These may include:  Fiber supplements if you have constipation.  Medicine to control diarrhea (antidiarrheal medicines).  Medicine to help control muscle spasms in your GI tract (antispasmodic medicines).  Medicines to help with any mental health issues, such as antidepressants or tranquilizers.  Therapy.  Talk therapy may help with anxiety, depression, or other mental health issues that can make IBS symptoms worse.  Stress reduction.  Managing your stress can help keep symptoms under control. HOME CARE INSTRUCTIONS   Take medicines only as directed by your health care provider.  Eat a healthy diet.  Avoid foods and drinks with added sugar.  Include more whole grains, fruits, and vegetables gradually into your diet. This may be especially helpful if you have IBS with constipation.  Avoid any foods and drinks that make your symptoms worse. These may include dairy products and caffeinated or carbonated drinks.  Do  not eat large meals.  Drink enough fluid to keep your urine clear or pale yellow.  Exercise regularly. Ask your health care provider for recommendations of good activities for you.  Keep all follow-up visits as directed by your health care provider. This is important. SEEK MEDICAL CARE IF:   You have constant pain.  You have trouble or pain with swallowing.  You have worsening diarrhea. SEEK IMMEDIATE MEDICAL CARE IF:   You have severe and  worsening abdominal pain.   You have diarrhea and:   You have a rash, stiff neck, or severe headache.   You are irritable, sleepy, or difficult to awaken.   You are weak, dizzy, or extremely thirsty.   You have bright red blood in your stool or you have black tarry stools.   You have unusual abdominal swelling that is painful.   You vomit continuously.   You vomit blood (hematemesis).   You have both abdominal pain and a fever.    This information is not intended to replace advice given to you by your health care provider. Make sure you discuss any questions you have with your health care provider.   Document Released: 02/13/2005 Document Revised: 03/06/2014 Document Reviewed: 10/31/2013 Elsevier Interactive Patient Education [employer].

## 2015-06-19 ENCOUNTER — Encounter: Payer: Self-pay | Admitting: Nurse Practitioner

## 2015-06-19 DIAGNOSIS — K580 Irritable bowel syndrome with diarrhea: Secondary | ICD-10-CM | POA: Insufficient documentation

## 2015-06-21 NOTE — Therapy (Signed)
[place], [place], [address] Phone: [phone]   Fax:  [phone]  Patient Details  Name: [name] MRN: [id number] Date of Birth: 1970/12/19 Referring Provider:  Meredith Pel, MD  Encounter Date: 06/21/2015  PHYSICAL THERAPY DISCHARGE SUMMARY  Visits from Start of Care: 13  Current functional level related to goals / functional outcomes: Patient has not returned since last skilled session    Remaining deficits: Unable to assess    Education / Equipment: None  Plan: Patient agrees to discharge.  Patient goals were not met. Patient is being discharged due to not returning since the last visit.  ?????       Deniece Ree PT, DPT [facility] [address] [place], [place], [address] Phone: [phone]   Fax:  [phone]

## 2015-06-25 LAB — PLEASE NOTE

## 2015-06-25 LAB — TOXASSURE SELECT 13 (MW), URINE: PDF: 0

## 2015-07-02 ENCOUNTER — Ambulatory Visit: Payer: 59 | Admitting: Nurse Practitioner

## 2015-07-16 ENCOUNTER — Telehealth: Payer: Self-pay | Admitting: Family Medicine

## 2015-07-16 MED ORDER — DEXLANSOPRAZOLE 60 MG PO CPDR: DELAYED_RELEASE_CAPSULE | ORAL | Status: DC

## 2015-07-16 NOTE — Addendum Note (Signed)
Addended by: Jeralene PetersREWS, Peggye Poon R on: 07/16/2015 04:33 PM   Modules accepted: Orders, Medications

## 2015-07-16 NOTE — Telephone Encounter (Signed)
Spoke with patient and informed her per Dr.Scott Luking- May do Dexilant 60 MG 1 tablet daily. Patient verbalized understanding.

## 2015-07-16 NOTE — Telephone Encounter (Signed)
Pt states her pantoprazole (PROTONIX) 40 MG tablet   Is not working for her and would like to go back to the  DEXILANT 60 MG capsule send to [facility]

## 2015-07-16 NOTE — Telephone Encounter (Signed)
May do DEXilant 60 milligram 1 daily #30, 5 refills cancel the other

## 2015-08-18 ENCOUNTER — Other Ambulatory Visit: Payer: Self-pay | Admitting: Nurse Practitioner

## 2015-09-10 ENCOUNTER — Encounter: Payer: Self-pay | Admitting: Family Medicine

## 2015-09-10 ENCOUNTER — Ambulatory Visit (INDEPENDENT_AMBULATORY_CARE_PROVIDER_SITE_OTHER): Payer: 59 | Admitting: Family Medicine

## 2015-09-10 DIAGNOSIS — L03811 Cellulitis of head [any part, except face]: Secondary | ICD-10-CM

## 2015-09-10 MED ORDER — FLUCONAZOLE 150 MG PO TABS: 150.0000 mg | ORAL_TABLET | Freq: Once | ORAL | Status: DC

## 2015-09-10 MED ORDER — HYDROCODONE-ACETAMINOPHEN 10-325 MG PO TABS: ORAL_TABLET | ORAL | Status: DC

## 2015-09-10 MED ORDER — DOXYCYCLINE HYCLATE 100 MG PO CAPS: 100.0000 mg | ORAL_CAPSULE | Freq: Two times a day (BID) | ORAL | Status: DC

## 2015-09-10 NOTE — Progress Notes (Signed)
   Subjective:    Patient ID: [name], female    DOB: Sep 01, 1970, 45 y.o.   MRN: [id number]  Otalgia  There is pain in the left ear. This is a new problem. The current episode started yesterday. Associated symptoms include headaches.   Patient states no other concerns this visit. Significant redness tenderness pain discomfort behind the left ear been present over the past week   Review of Systems  HENT: Positive for ear pain.   Neurological: Positive for headaches.   No fever chills or sweats.    Objective:   Physical Exam  Redness tenderness behind the left ear. Neck no masses. Lungs clear heart regular.      Assessment & Plan:  Postauricular cellulitis should gradually get better with treatment doxycycline twice a day 10 days warm compresses frequently warning signs were discussed in detail call us if any problems  Patient did request a refill on her pain medication states she will reschedule her pain visit with us

## 2015-09-14 ENCOUNTER — Ambulatory Visit: Payer: 59 | Admitting: Nurse Practitioner

## 2015-09-14 ENCOUNTER — Telehealth: Payer: Self-pay | Admitting: Family Medicine

## 2015-09-14 MED ORDER — AZITHROMYCIN 250 MG PO TABS: ORAL_TABLET | ORAL | Status: DC

## 2015-09-14 NOTE — Telephone Encounter (Signed)
How is a cellulitis behind her ear? Is her throat looking red to her or swollen? Is she having high fever chills? Does she feel like she needs to be rechecked? Can she swallow liquids? Is the doxycycline causing heartburn?

## 2015-09-14 NOTE — Telephone Encounter (Signed)
Pt was seen fri and had cellulitis behind the ear. Pt states that she has been tired and drained and now her throat hurts when she swallows. Pt was prescribed doxy. Pt wants to know what to do.

## 2015-09-14 NOTE — Telephone Encounter (Signed)
Cellulitis better, just sore throat, no fever or chills, can swallow liquids, no heartburn. Can something else be prescribed? [facility]

## 2015-09-14 NOTE — Telephone Encounter (Signed)
zpack

## 2015-09-14 NOTE — Telephone Encounter (Signed)
Left message on voicemail notifying patient that med was sent to pharmacy.  

## 2015-09-14 NOTE — Telephone Encounter (Signed)
Patient is having fatigue and sore throat. No other symptoms.

## 2015-09-14 NOTE — Telephone Encounter (Signed)
[facility]

## 2015-09-19 IMAGING — CT CT ABD-PELV W/ CM
2 of 4 series · 17 of 46 positions shown, 19 images · IV contrast (Omnipaque 300)
Comparison: 10/10/2005

CLINICAL DATA: Abdominal and back pain

EXAM:
CT ABDOMEN AND PELVIS WITH CONTRAST
TECHNIQUE: Multidetector CT imaging of the abdomen and pelvis was performed
using the standard protocol following bolus administration of
intravenous contrast.
CONTRAST:  100mL OMNIPAQUE IOHEXOL 300 MG/ML SOLN, 50mL OMNIPAQUE
IOHEXOL 300 MG/ML SOLN

[Series 2: abd_pel_with 5.0 b40f · axial · 0.75mm/px · z∈[+458,+878]mm · 14 of 94 slices shown, 16 images]
[im 5/94  soft-tissue]
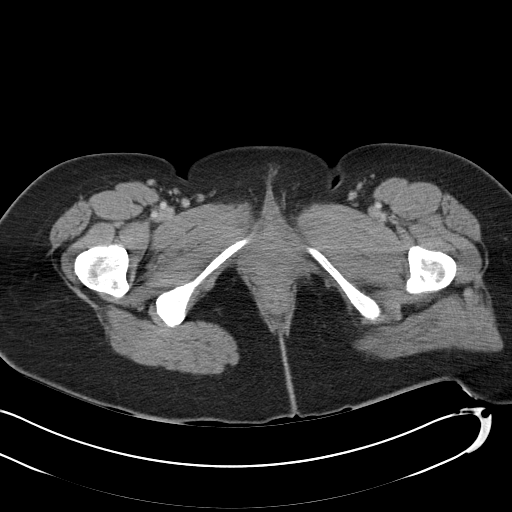
[im 5/94  bone]
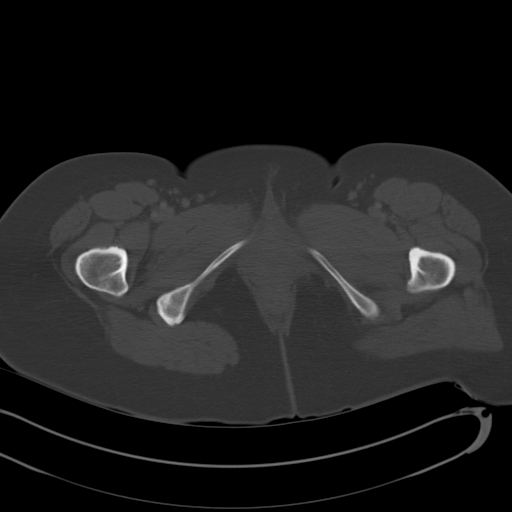
[im 14/94  soft-tissue]
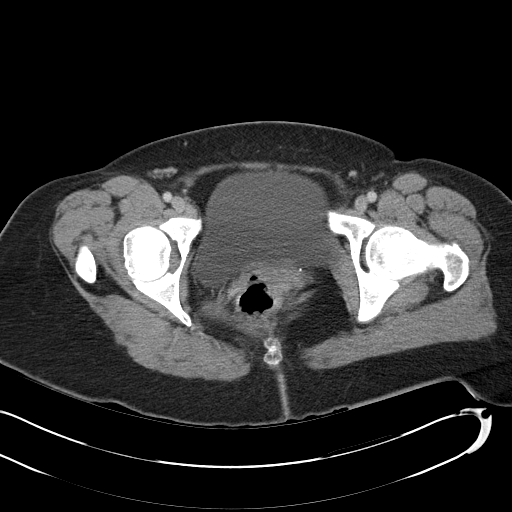
[im 19/94  soft-tissue]
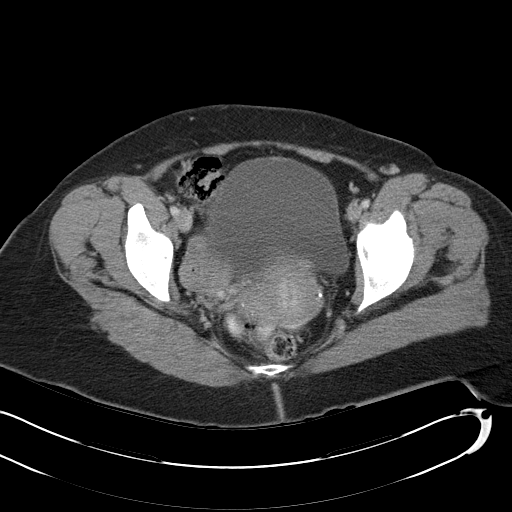
[im 24/94  soft-tissue]
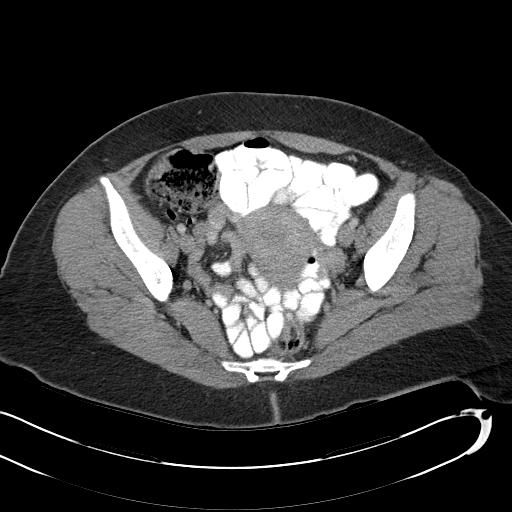
[im 33/94  soft-tissue]
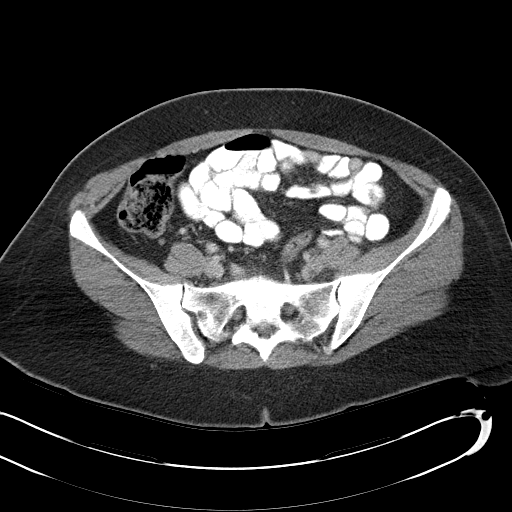
[im 38/94  soft-tissue]
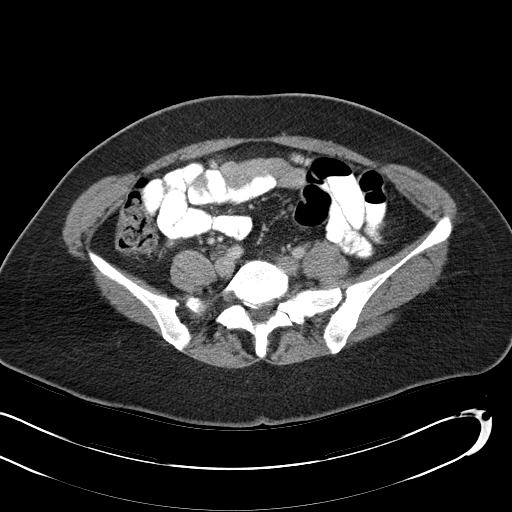
[im 42/94  soft-tissue]
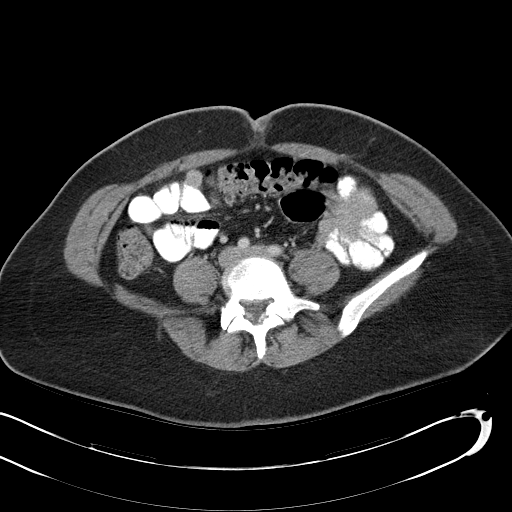
[im 52/94  soft-tissue]
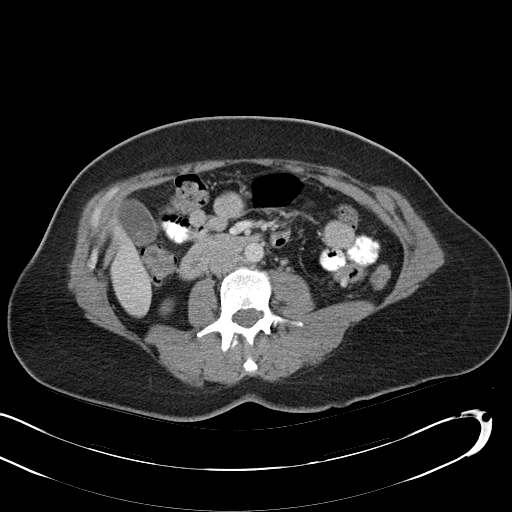
[im 56/94  soft-tissue]
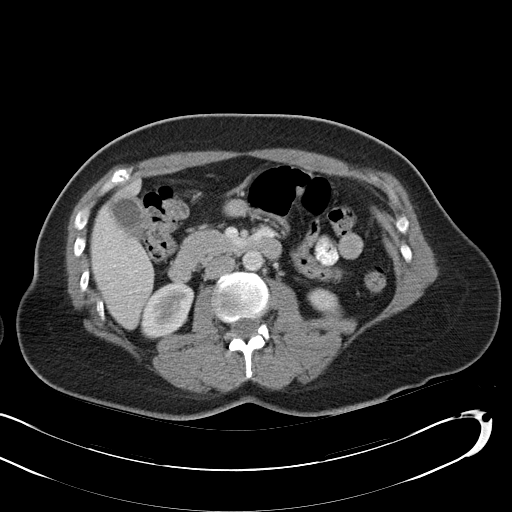
[im 56/94  bone]
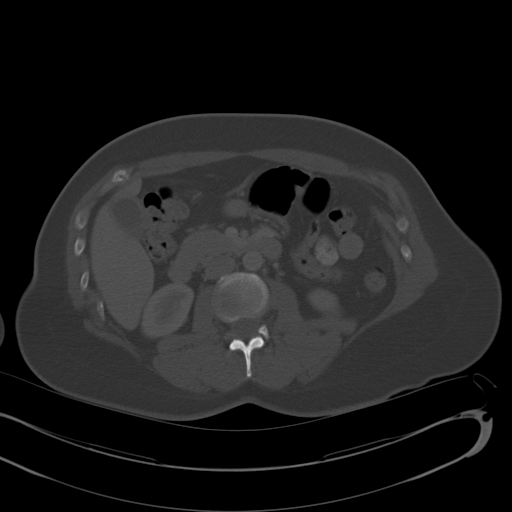
[im 61/94  soft-tissue]
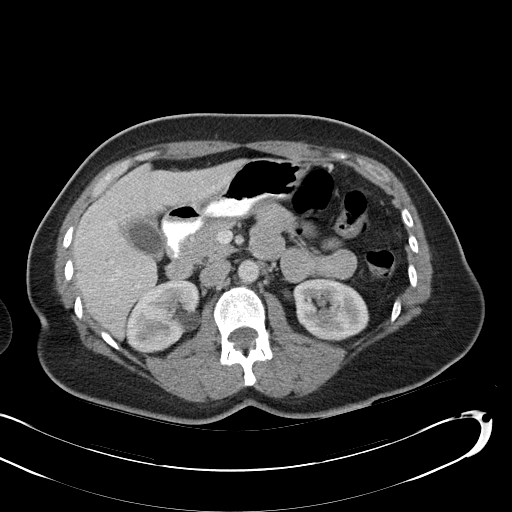
[im 70/94  soft-tissue]
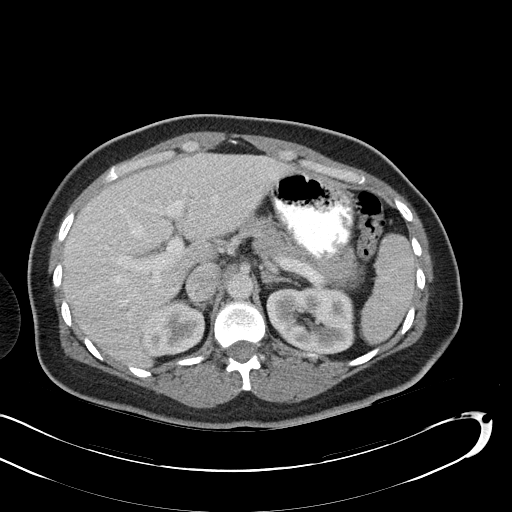
[im 75/94  soft-tissue]
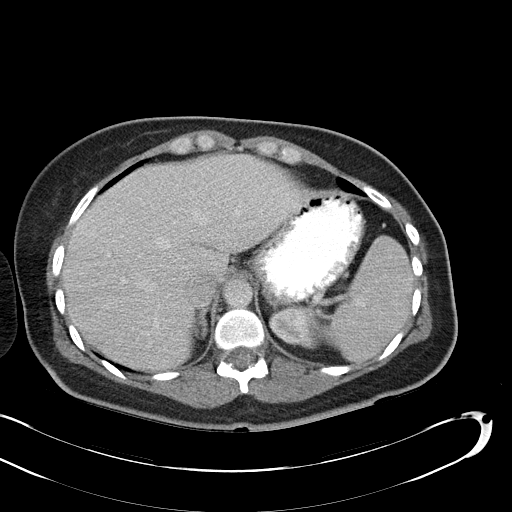
[im 80/94  soft-tissue]
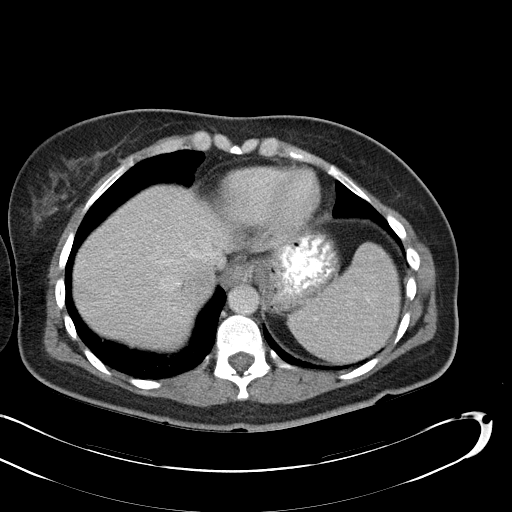
[im 89/94  soft-tissue]
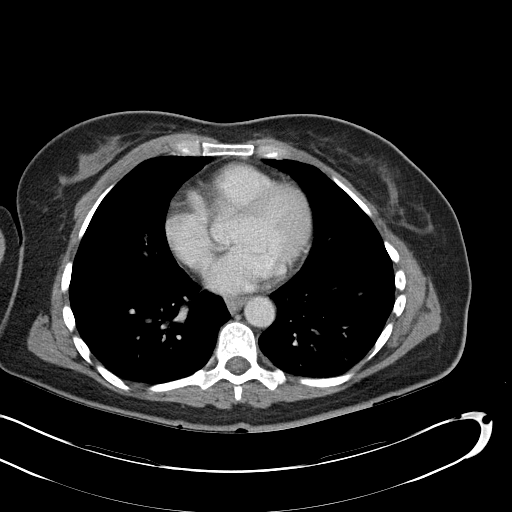

[Series 4: abd_pel_with 3.0 spo cor · coronal · 0.78mm/px · 3 of 93 slices shown]
[im 31/93  soft-tissue]
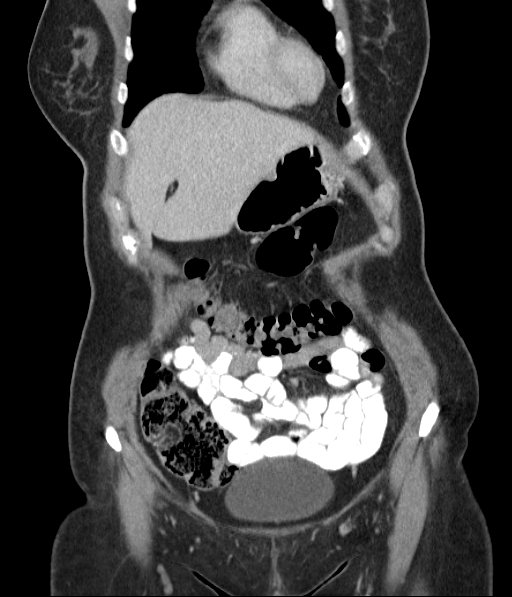
[im 41/93  soft-tissue]
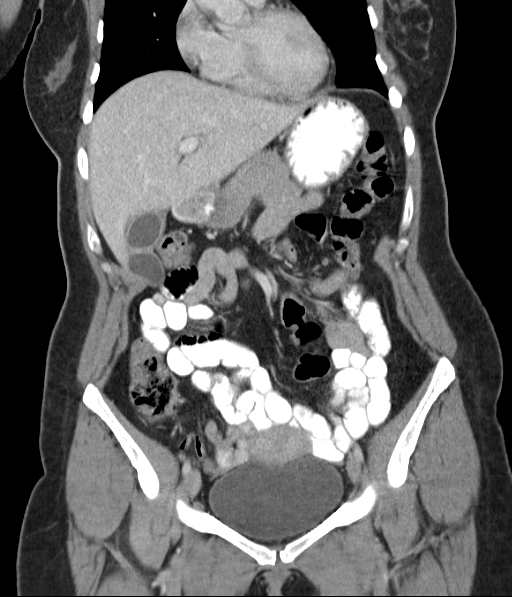
[im 52/93  soft-tissue]
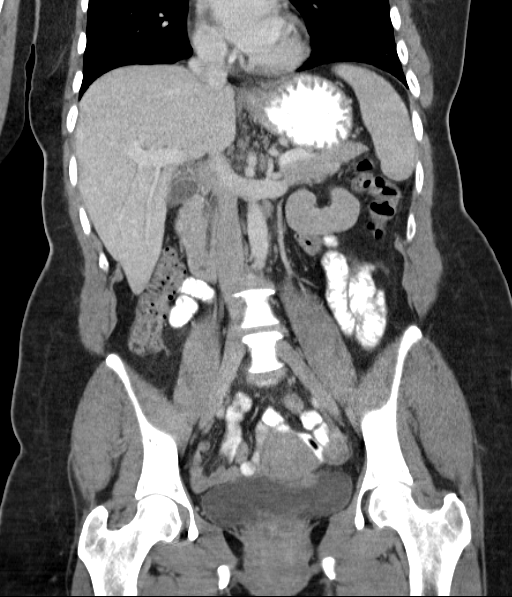

[17 of 46 positions shown; findings below may reference images not displayed]

FINDINGS: BODY WALL: Unremarkable.

LOWER CHEST: Unremarkable.

ABDOMEN/PELVIS:

Liver: No focal abnormality.

Biliary: Cholelithiasis. Mild pericholecystic fluid or wall
thickening, but there is no gallbladder distention typical of cystic
duct obstruction.

Pancreas: Unremarkable.

Spleen: Unremarkable.

Adrenals: Unremarkable.

Kidneys and ureters: No hydronephrosis or stone.

Bladder: Unremarkable.

Reproductive: Unremarkable.

Bowel: No obstruction. Normal appendix.  Few colonic diverticula.

Retroperitoneum: No mass or adenopathy.

Peritoneum: No free fluid or gas.

Vascular: No acute abnormality.

OSSEOUS: Focal degenerative disc disease at L5-S1, notable to the
age.
IMPRESSION: Cholelithiasis with mild pericholecystic fluid or gallbladder wall
thickening.

## 2015-09-27 ENCOUNTER — Ambulatory Visit: Payer: 59 | Admitting: Nurse Practitioner

## 2015-10-08 ENCOUNTER — Ambulatory Visit (INDEPENDENT_AMBULATORY_CARE_PROVIDER_SITE_OTHER): Payer: 59 | Admitting: Family Medicine

## 2015-10-08 ENCOUNTER — Encounter: Payer: Self-pay | Admitting: Family Medicine

## 2015-10-08 VITALS — BP 122/82 | Ht 66.0 in | Wt 182.8 lb

## 2015-10-08 DIAGNOSIS — G8929 Other chronic pain: Secondary | ICD-10-CM

## 2015-10-08 DIAGNOSIS — M549 Dorsalgia, unspecified: Secondary | ICD-10-CM

## 2015-10-08 DIAGNOSIS — N951 Menopausal and female climacteric states: Secondary | ICD-10-CM | POA: Diagnosis not present

## 2015-10-08 MED ORDER — HYDROCODONE-ACETAMINOPHEN 10-325 MG PO TABS: ORAL_TABLET | ORAL | 0 refills | Status: DC

## 2015-10-08 NOTE — Progress Notes (Signed)
   Subjective:    Patient ID: [name], female    DOB: 1970-04-14, 45 y.o.   MRN: [id number]  HPI This patient was seen today for chronic pain  The medication list was reviewed and updated.   -Compliance with medication: yes  - Number patient states they take daily: 1/2 to one a day  -when was the last dose patient took? today  The patient was advised the importance of maintaining medication and not using illegal substances with these.  Refills needed: yes  The patient was educated that we can provide 3 monthly scripts for their medication, it is their responsibility to follow the instructions.  Side effects or complications from medications: none  Patient is aware that pain medications are meant to minimize the severity of the pain to allow their pain levels to improve to allow for better function. They are aware of that pain medications cannot totally remove their pain.  Due for UDT ( at least once per year) : Next visit  Patient does have irregular cycles and occasional night sweats She states pain medicine she averages anywhere between 1-1/2 and 2 per day she is currently having to work 2 jobs       Review of Systems She relates intermittent low back pain intermittent radiation down the left leg denies high fever chills sweats    Objective:   Physical Exam  She relates low back pain discomfort subjectively negative straight leg raise lungs clear heart regular      Assessment & Plan:  Social stresses with separated ex-husband difficult time patient is having but she denies being homicidal or suicidal she will work on stress relief  Chronic low back pain with intermittent sciatica pain medication no greater than 2 per day #63 prescriptions were written follow-up in 3 months time will need urine drug testing on follow-up as part of routine chronic pain treatment   If patient has ongoing perimenopausal symptoms follow-up for this. Patient was also told that if  she goes more than 1 year without bleeding then has vaginal bleeding she must get that checked right away

## 2015-11-12 ENCOUNTER — Ambulatory Visit (INDEPENDENT_AMBULATORY_CARE_PROVIDER_SITE_OTHER): Payer: 59 | Admitting: Gastroenterology

## 2015-11-12 ENCOUNTER — Encounter: Payer: Self-pay | Admitting: Gastroenterology

## 2015-11-12 DIAGNOSIS — K529 Noninfective gastroenteritis and colitis, unspecified: Secondary | ICD-10-CM | POA: Diagnosis not present

## 2015-11-12 MED ORDER — DICYCLOMINE HCL 10 MG PO CAPS: 10.0000 mg | ORAL_CAPSULE | Freq: Three times a day (TID) | ORAL | 3 refills | Status: DC

## 2015-11-12 NOTE — Patient Instructions (Signed)
On your "good days", take Bentyl twice a day before breakfast and dinner. On the "bad days", take it 4 times a day.   I will see you in 6-8 weeks.   Find out about the history of colon cancer in your brother and dad.   It was good to see you again!

## 2015-11-12 NOTE — Progress Notes (Signed)
Primary Care Physician:  Lilyan Punt, MD Primary Gastroenterologist:  Dr. Darrick Penna   Chief Complaint  Patient presents with  . Abdominal Pain    HPI:   [name] is a 45 y.o. female presenting today secondary to abdominal pain and diarrhea. Last seen in 2014 and underwent EGD with stricture at GE junction s/p dilation. Other findings of non-erosive gastritis and normal duodenum. She then underwent a cholecystectomy.   Since cholecystectomy had postprandial loose stool. If takes too much Colestid will have significant constipation. Prior to this was taking Questran. Has associated abdominal cramping. Stress affects symptoms. Has two sets of twins, works 2 jobs.   Father had prostate and colon cancer, as well as brother. Unsure where cancer actually started (she believes it may have spread to the colon). No rectal bleeding. She believes her father's originated in the prostate. She will be asking her sister-in-law about her brother's history. Has not seen any rectal bleeding recently. No unexplained weight loss.   Past Medical History:  Diagnosis Date  . Anxiety   . Arthritis    Knee  . Chronic back pain   . Chronic knee pain   . Family history of anesthesia complication    Brother and Mother woke up during surgery; Heard and felt could not speak  . GERD (gastroesophageal reflux disease)   . Headache    stress  . Menorrhagia 09/09/2012    Past Surgical History:  Procedure Laterality Date  . CESAREAN SECTION     X2   . CHOLECYSTECTOMY N/A 01/17/2013   Procedure: LAPAROSCOPIC CHOLECYSTECTOMY;  Surgeon: Marlane Hatcher, MD;  Location: [facility];  Service: General;  Laterality: N/A;  . DILITATION & CURRETTAGE/HYSTROSCOPY WITH THERMACHOICE ABLATION N/A 02/25/2013   Procedure: DILATATION & CURETTAGE/HYSTEROSCOPY WITH THERMACHOICE ABLATION;  Surgeon: [name], MD;  Location: [facility];  Service: Gynecology;  Laterality: N/A;  Total Therapy Time:8:54 minutes Temperature:87  degrees  . ENDOMETRIAL ABLATION  12/14  . ESOPHAGOGASTRODUODENOSCOPY N/A 12/27/2012   Dr. Darrick Penna: stricture at GE junction s/p dilation, non-erosive gastritis, normal duodenum.   . TOTAL KNEE ARTHROPLASTY Right 12/15/2014   Procedure: TOTAL KNEE ARTHROPLASTY;  Surgeon: Cammy Copa, MD;  Location: [facility] OR;  Service: Orthopedics;  Laterality: Right;  . TUBAL LIGATION     along with c-section    Current Outpatient Prescriptions  Medication Sig Dispense Refill  . colestipol (COLESTID) 1 G tablet TAKE 2 TABLETS BY MOUTH TWICE DAILY. 120 tablet 0  . dexlansoprazole (DEXILANT) 60 MG capsule Take 1 tablet by mouth once daily. 30 capsule 5  . DULoxetine (CYMBALTA) 60 MG capsule Take 1 capsule (60 mg total) by mouth daily. 30 capsule 2  . gabapentin (NEURONTIN) 300 MG capsule TAKE ONE CAPSULE BY MOUTH THREE TIMES DAILY. 90 capsule 5  . HYDROcodone-acetaminophen (NORCO) 10-325 MG tablet 1/2 to 1 bid prn 60 tablet 0  . dicyclomine (BENTYL) 10 MG capsule Take 1 capsule (10 mg total) by mouth 4 (four) times daily -  before meals and at bedtime. 120 capsule 3   No current facility-administered medications for this visit.     Allergies as of 11/12/2015 - Review Complete 11/12/2015  Allergen Reaction Noted  . Penicillins Nausea And Vomiting 06/28/2012  . Sulfa antibiotics Swelling 06/28/2012  . Xanax [alprazolam] Other (See Comments) 06/28/2012    Family History  Problem Relation Age of Onset  . Hypertension Mother   . Diabetes Mother   . Thyroid disease Mother   .  COPD Mother   . Arthritis Mother   . Depression Mother   . Mental illness Mother   . Stroke Mother   . Prostate cancer Father     unsure primary   . Colon cancer Father   . Diabetes Maternal Grandmother   . Diabetes Paternal Grandmother   . Cancer Sister   . Heart attack Brother   . Prostate cancer Brother     spread to colon. Patient is unsure.   . Diabetes Sister   . Depression Sister   . Mental illness Sister   .  Colon polyps Sister     Social History   Social History  . Marital status: Divorced    Spouse name: N/A  . Number of children: N/A  . Years of education: N/A   Occupational History  . Not on file.   Social History Main Topics  . Smoking status: Current Every Day Smoker    Packs/day: 1.00    Years: 24.00    Types: Cigarettes    Start date: 11/26/1983  . Smokeless tobacco: Never Used     Comment: quit 3 times for a total of 8 years since 1985  . Alcohol use No     Comment: socially just mixed drinks   . Drug use: No  . Sexual activity: Yes    Partners: Male    Birth control/ protection: Surgical   Other Topics Concern  . Not on file   Social History Narrative  . No narrative on file    Review of Systems: Gen: Denies any fever, chills, fatigue, weight loss, lack of appetite.  CV: Denies chest pain, heart palpitations, peripheral edema, syncope.  Resp: Denies shortness of breath at rest or with exertion. Denies wheezing or cough.  GI: Denies dysphagia or odynophagia. Denies jaundice, hematemesis, fecal incontinence. GU : Denies urinary burning, urinary frequency, urinary hesitancy MS: +back pain  Derm: Denies rash, itching, dry skin Psych: Denies depression, anxiety, memory loss, and confusion Heme: Denies bruising, bleeding, and enlarged lymph nodes.  Physical Exam: BP 127/85   Pulse 90   Temp 97.8 F (36.6 C) (Oral)   Ht 5\' 5"  (1.651 m)   Wt 180 lb 3.2 oz (81.7 kg)   BMI 29.99 kg/m  General:   Alert and oriented. Pleasant and cooperative. Well-nourished and well-developed.  Head:  Normocephalic and atraumatic. Eyes:  Without icterus, sclera clear and conjunctiva pink.  Ears:  Normal auditory acuity. Lungs:  Clear to auscultation bilaterally. No wheezes, rales, or rhonchi. No distress.  Heart:  S1, S2 present without murmurs appreciated.  Abdomen:  +BS, soft, non-tender and non-distended. No HSM noted. No guarding or rebound. No masses appreciated.  Rectal:   Deferred  Msk:  Symmetrical without gross deformities. Normal posture. Extremities:  Without edema. Neurologic:  Alert and  oriented x4;  grossly normal neurologically. Psych:  Alert and cooperative. Normal mood and affect.

## 2015-11-19 ENCOUNTER — Encounter: Payer: Self-pay | Admitting: Gastroenterology

## 2015-11-19 NOTE — Assessment & Plan Note (Signed)
45 year old female with history of chronic diarrhea, likely dealing with IBS and possible bile salt diarrhea as well. Will trial Bentyl prn. Some confusion regarding family history and colon cancer, which she will research and let us know at time of her next appointment. May need colonoscopy in near future if positive family history of colon cancer. Return in 6-8 weeks.

## 2015-11-22 NOTE — Progress Notes (Signed)
CC'D TO PCP °

## 2015-12-09 ENCOUNTER — Telehealth: Payer: Self-pay | Admitting: Gastroenterology

## 2015-12-09 NOTE — Telephone Encounter (Signed)
Received FMLA forms on my desk. Can we find out why these need to be filled out?

## 2015-12-10 NOTE — Telephone Encounter (Signed)
I called and spoke with Lilygrace. She said she needed FMLA forms because of her random flare ups of IBS-D. She needed them to protect her employment with [employer]. She also wanted you to know that her brother and father had colon cancer. Brother was [phone] when he died and father was [phone]. Her sister has a history of colon polyps. Patient is aware of the $29 charge to complete forms and is aware of her upcoming OV

## 2015-12-20 NOTE — Telephone Encounter (Signed)
completed

## 2016-01-07 ENCOUNTER — Encounter: Payer: Self-pay | Admitting: Nurse Practitioner

## 2016-01-07 ENCOUNTER — Encounter: Payer: Self-pay | Admitting: Gastroenterology

## 2016-01-07 ENCOUNTER — Ambulatory Visit (INDEPENDENT_AMBULATORY_CARE_PROVIDER_SITE_OTHER): Payer: 59 | Admitting: Nurse Practitioner

## 2016-01-07 ENCOUNTER — Ambulatory Visit (INDEPENDENT_AMBULATORY_CARE_PROVIDER_SITE_OTHER): Payer: 59 | Admitting: Gastroenterology

## 2016-01-07 ENCOUNTER — Ambulatory Visit: Payer: 59 | Admitting: Family Medicine

## 2016-01-07 VITALS — BP 128/84 | Ht 65.0 in | Wt 182.8 lb

## 2016-01-07 VITALS — BP 147/92 | HR 86 | Temp 98.0°F | Ht 65.0 in | Wt 179.0 lb

## 2016-01-07 DIAGNOSIS — K580 Irritable bowel syndrome with diarrhea: Secondary | ICD-10-CM | POA: Diagnosis not present

## 2016-01-07 DIAGNOSIS — M170 Bilateral primary osteoarthritis of knee: Secondary | ICD-10-CM | POA: Diagnosis not present

## 2016-01-07 DIAGNOSIS — Z79891 Long term (current) use of opiate analgesic: Secondary | ICD-10-CM

## 2016-01-07 MED ORDER — HYDROCODONE-ACETAMINOPHEN 10-325 MG PO TABS: ORAL_TABLET | ORAL | 0 refills | Status: DC

## 2016-01-07 NOTE — Patient Instructions (Signed)
Continue Bentyl and only take as needed like we discussed.   Please call when you are able to proceed with the colonoscopy.

## 2016-01-07 NOTE — Progress Notes (Signed)
Subjective: This patient was seen today for chronic pain  The medication list was reviewed and updated.   -Compliance with medication: yes  - Number patient states they take daily: 0-2  -when was the last dose patient took? This am  The patient was advised the importance of maintaining medication and not using illegal substances with these.  Refills needed: yes  The patient was educated that we can provide 3 monthly scripts for their medication, it is their responsibility to follow the instructions.  Side effects or complications from medications: none  Patient is aware that pain medications are meant to minimize the severity of the pain to allow their pain levels to improve to allow for better function. They are aware of that pain medications cannot totally remove their pain.  Due for UDT ( at least once per year) : UTD  N[facility] reviewed  Mainly needs pain med for chronic knee pain especially on days she works. Slight swelling and pain in right knee where she has had knee replacement. Left knee causes significant pain but patient does not want to do replacement at this time.   Objective: NAD. Alert, oriented. Lungs clear. Heart RRR. Mild edema right knee.   Assessment:  Problem List Items Addressed This Visit      Musculoskeletal and Integument   Arthritis of both knees (Chronic)   Relevant Medications   HYDROcodone-acetaminophen (NORCO) 10-325 MG tablet     Other   Encounter for long-term opiate analgesic use - Primary     Plan:  Meds ordered this encounter  Medications  . DISCONTD: HYDROcodone-acetaminophen (NORCO) 10-325 MG tablet    Sig: 1/2 to 1 bid prn    Dispense:  60 tablet    Refill:  0    Order Specific Question:   Supervising Provider    Answer:   Merlyn AlbertLUKING, WILLIAM S [[id number]]  . DISCONTD: HYDROcodone-acetaminophen (NORCO) 10-325 MG tablet    Sig: 1/2 to 1 bid prn    Dispense:  60 tablet    Refill:  0    May fill 30 days from 01/07/16    Order Specific  Question:   Supervising Provider    Answer:   Merlyn AlbertLUKING, WILLIAM S [[id number]]  . HYDROcodone-acetaminophen (NORCO) 10-325 MG tablet    Sig: 1/2 to 1 bid prn    Dispense:  60 tablet    Refill:  0    May fill 60 days from 01/07/16    Order Specific Question:   Supervising Provider    Answer:   Merlyn AlbertLUKING, WILLIAM S [[id number]]   Continue to use pain med sparingly on a prn basis.  Return in about 3 months (around 04/08/2016) for pain management.

## 2016-01-07 NOTE — Progress Notes (Addendum)
REVIEWED-NO ADDITIONAL RECOMMENDATIONS.  Referring Provider: Babs SciaraLuking, Scott A, MD Primary Care Physician:  Lilyan PuntScott Luking, MD  Primary GI: Dr. Darrick PennaFields   Chief Complaint  Patient presents with  . Follow-up    overall doing better since starting Bentyl. FYI-brother and father had cancer that originated in colon    HPI:   [name] Dan HumphreysWalker is a 45 y.o. female presenting today with a history of IBS, diarrhea predominant. She is here for follow-up after starting Bentyl.   Doing much better. Having normal BMs. Bentyl only prn, has to take rarely. Only abdominal discomfort with episodes. No rectal bleeding. She is a single mom with 2 sets of twins, and she is working multiple jobs. She notes that these "flares" occur with stress. She is worried about getting laid off from her job, which is not helping her anxiety and worsens IBS symptoms. Overall though, she is much improved.   Brother and father both with history of colon cancer, sister with history of colon polyps. She is asking if she may hold on high risk screening colonoscopy until the first of the year due to many upcoming things such as Christmas gifts, saving up money for deductible, and ensuring she is in a good place financially in case of a lay off.   Past Medical History:  Diagnosis Date  . Anxiety   . Arthritis    Knee  . Chronic back pain   . Chronic knee pain   . Family history of anesthesia complication    Brother and Mother woke up during surgery; Heard and felt could not speak  . GERD (gastroesophageal reflux disease)   . Headache    stress  . Menorrhagia 09/09/2012    Past Surgical History:  Procedure Laterality Date  . CESAREAN SECTION     X2   . CHOLECYSTECTOMY N/A 01/17/2013   Procedure: LAPAROSCOPIC CHOLECYSTECTOMY;  Surgeon: Marlane HatcherWilliam S Bradford, MD;  Location: [facility];  Service: General;  Laterality: N/A;  . DILITATION & CURRETTAGE/HYSTROSCOPY WITH THERMACHOICE ABLATION N/A 02/25/2013   Procedure: DILATATION &  CURETTAGE/HYSTEROSCOPY WITH THERMACHOICE ABLATION;  Surgeon: Tilda BurrowJohn V Ferguson, MD;  Location: [facility];  Service: Gynecology;  Laterality: N/A;  Total Therapy Time:8:54 minutes Temperature:87 degrees  . ENDOMETRIAL ABLATION  12/14  . ESOPHAGOGASTRODUODENOSCOPY N/A 12/27/2012   Dr. Darrick PennaFields: stricture at GE junction s/p dilation, non-erosive gastritis, normal duodenum.   . TOTAL KNEE ARTHROPLASTY Right 12/15/2014   Procedure: TOTAL KNEE ARTHROPLASTY;  Surgeon: Cammy CopaScott Gregory Dean, MD;  [facility];  Service: Orthopedics;  Laterality: Right;  . TUBAL LIGATION     along with c-section    Current Outpatient Prescriptions  Medication Sig Dispense Refill  . Aspirin-Salicylamide-Caffeine (BC HEADACHE PO) Take by mouth as needed.    . colestipol (COLESTID) 1 G tablet TAKE 2 TABLETS BY MOUTH TWICE DAILY. (Patient taking differently: TAKE 2 TABLETS BY MOUTH TWICE DAILY. Takes prn d/t causes constipation) 120 tablet 0  . dexlansoprazole (DEXILANT) 60 MG capsule Take 1 tablet by mouth once daily. 30 capsule 5  . dicyclomine (BENTYL) 10 MG capsule Take 1 capsule (10 mg total) by mouth 4 (four) times daily -  before meals and at bedtime. (Patient taking differently: Take 10 mg by mouth as needed. ) 120 capsule 3  . DULoxetine (CYMBALTA) 60 MG capsule Take 1 capsule (60 mg total) by mouth daily. 30 capsule 2  . gabapentin (NEURONTIN) 300 MG capsule TAKE ONE CAPSULE BY MOUTH THREE TIMES DAILY. (Patient taking differently: TAKE ONE CAPSULE  BY MOUTH THREE TIMES DAILY. Takes once a day and sometimes 3 times a day if needed.) 90 capsule 5  . HYDROcodone-acetaminophen (NORCO) 10-325 MG tablet 1/2 to 1 bid prn 60 tablet 0   No current facility-administered medications for this visit.     Allergies as of 01/07/2016 - Review Complete 01/07/2016  Allergen Reaction Noted  . Penicillins Nausea And Vomiting 06/28/2012  . Sulfa antibiotics Swelling 06/28/2012  . Xanax [alprazolam] Other (See Comments) 06/28/2012     Family History  Problem Relation Age of Onset  . Hypertension Mother   . Diabetes Mother   . Thyroid disease Mother   . COPD Mother   . Arthritis Mother   . Depression Mother   . Mental illness Mother   . Stroke Mother   . Prostate cancer Father     unsure primary   . Colon cancer Father   . Diabetes Maternal Grandmother   . Diabetes Paternal Grandmother   . Cancer Sister   . Heart attack Brother   . Colon cancer Brother   . Diabetes Sister   . Depression Sister   . Mental illness Sister   . Colon polyps Sister     Social History   Social History  . Marital status: Divorced    Spouse name: N/A  . Number of children: N/A  . Years of education: N/A   Social History Main Topics  . Smoking status: Current Every Day Smoker    Packs/day: 1.00    Years: 24.00    Types: Cigarettes    Start date: 11/26/1983  . Smokeless tobacco: Never Used     Comment: quit 3 times for a total of 8 years since 1985  . Alcohol use No     Comment: socially just mixed drinks   . Drug use: No  . Sexual activity: Yes    Partners: Male    Birth control/ protection: Surgical   Other Topics Concern  . None   Social History Narrative  . None    Review of Systems: As mentioned in HPI   Physical Exam: BP (!) 147/92   Pulse 86   Temp 98 F (36.7 C) (Oral)   Ht 5\' 5"  (1.651 m)   Wt 179 lb (81.2 kg)   LMP 12/27/2015 (Approximate)   BMI 29.79 kg/m  General:   Alert and oriented. No distress noted. Pleasant and cooperative.  Head:  Normocephalic and atraumatic. Eyes:  Conjuctiva clear without scleral icterus. Abdomen:  +BS, soft, non-tender and non-distended. No rebound or guarding. No HSM or masses noted. Msk:  Symmetrical without gross deformities. Normal posture. Extremities:  Without edema. Neurologic:  Alert and  oriented x4;  grossly normal neurologically. Psych:  Alert and cooperative. Normal mood and affect.

## 2016-01-07 NOTE — Assessment & Plan Note (Signed)
Doing well with Bentyl only prn. Stress aggravates symptoms as expected. Needs high risk initial screening colonoscopy due to family history of colon cancer in 2 first degree relatives; sister also has history of polyps. She would like to wait until the first of the year. No concerning lower GI or upper symptoms. Continue Bentyl prn, and she call us in January to arrange colonoscopy.

## 2016-01-07 NOTE — Progress Notes (Signed)
cc'ed to pcp °

## 2016-01-08 ENCOUNTER — Encounter: Payer: Self-pay | Admitting: Nurse Practitioner

## 2016-01-11 ENCOUNTER — Telehealth: Payer: Self-pay | Admitting: Gastroenterology

## 2016-01-11 ENCOUNTER — Emergency Department (HOSPITAL_COMMUNITY)
Admission: EM | Admit: 2016-01-11 | Discharge: 2016-01-11 | Disposition: A | Discharge status: Home / Self Care | Payer: 59 | Attending: Emergency Medicine | Admitting: Emergency Medicine

## 2016-01-11 ENCOUNTER — Telehealth: Payer: Self-pay

## 2016-01-11 ENCOUNTER — Encounter (HOSPITAL_COMMUNITY): Payer: Self-pay

## 2016-01-11 DIAGNOSIS — F17210 Nicotine dependence, cigarettes, uncomplicated: Secondary | ICD-10-CM | POA: Insufficient documentation

## 2016-01-11 DIAGNOSIS — R109 Unspecified abdominal pain: Secondary | ICD-10-CM | POA: Diagnosis present

## 2016-01-11 DIAGNOSIS — Z7982 Long term (current) use of aspirin: Secondary | ICD-10-CM | POA: Insufficient documentation

## 2016-01-11 DIAGNOSIS — Z79899 Other long term (current) drug therapy: Secondary | ICD-10-CM | POA: Diagnosis not present

## 2016-01-11 DIAGNOSIS — K580 Irritable bowel syndrome with diarrhea: Secondary | ICD-10-CM | POA: Diagnosis not present

## 2016-01-11 DIAGNOSIS — N760 Acute vaginitis: Secondary | ICD-10-CM

## 2016-01-11 DIAGNOSIS — R1084 Generalized abdominal pain: Secondary | ICD-10-CM

## 2016-01-11 DIAGNOSIS — B9689 Other specified bacterial agents as the cause of diseases classified elsewhere: Secondary | ICD-10-CM

## 2016-01-11 HISTORY — DX: Irritable bowel syndrome, unspecified: K58.9

## 2016-01-11 LAB — URINALYSIS, ROUTINE W REFLEX MICROSCOPIC
Bilirubin Urine: NEGATIVE
Glucose, UA: NEGATIVE mg/dL
Hgb urine dipstick: NEGATIVE
Ketones, ur: NEGATIVE mg/dL
Leukocytes, UA: NEGATIVE
Nitrite: NEGATIVE
Specific Gravity, Urine: 1.025 (ref 1.005–1.030)
pH: 6 (ref 5.0–8.0)

## 2016-01-11 LAB — COMPREHENSIVE METABOLIC PANEL
ALT: 17 U/L (ref 14–54)
AST: 19 U/L (ref 15–41)
Albumin: 4.6 g/dL (ref 3.5–5.0)
Alkaline Phosphatase: 62 U/L (ref 38–126)
Anion gap: 8 (ref 5–15)
BUN: 20 mg/dL (ref 6–20)
CO2: 25 mmol/L (ref 22–32)
Calcium: 9.9 mg/dL (ref 8.9–10.3)
Chloride: 108 mmol/L (ref 101–111)
Creatinine, Ser: 0.78 mg/dL (ref 0.44–1.00)
GFR calc Af Amer: >60 mL/min (ref >60)
GFR calc non Af Amer: >60 mL/min (ref >60)
Glucose, Bld: 92 mg/dL (ref 65–99)
Potassium: 3.5 mmol/L (ref 3.5–5.1)
Sodium: 141 mmol/L (ref 135–145)
Total Bilirubin: 0.6 mg/dL (ref 0.3–1.2)
Total Protein: 7.8 g/dL (ref 6.5–8.1)

## 2016-01-11 LAB — CBC WITH DIFFERENTIAL/PLATELET
Basophils Absolute: 0 10*3/uL (ref 0.0–0.1)
Basophils Relative: 0 %
Eosinophils Absolute: 0.1 10*3/uL (ref 0.0–0.7)
Eosinophils Relative: 1 %
HCT: 45.7 % (ref 36.0–46.0)
Hemoglobin: 15.4 g/dL — ABNORMAL HIGH (ref 12.0–15.0)
Lymphocytes Relative: 15 %
Lymphs Abs: 1.4 10*3/uL (ref 0.7–4.0)
MCH: 29.9 pg (ref 26.0–34.0)
MCHC: 33.7 g/dL (ref 30.0–36.0)
MCV: 88.7 fL (ref 78.0–100.0)
Monocytes Absolute: 0.6 10*3/uL (ref 0.1–1.0)
Monocytes Relative: 7 %
Neutro Abs: 7.5 10*3/uL (ref 1.7–7.7)
Neutrophils Relative %: 77 %
Platelets: 158 10*3/uL (ref 150–400)
RBC: 5.15 MIL/uL — ABNORMAL HIGH (ref 3.87–5.11)
RDW: 13.7 % (ref 11.5–15.5)
WBC: 9.6 10*3/uL (ref 4.0–10.5)

## 2016-01-11 LAB — URINE MICROSCOPIC-ADD ON

## 2016-01-11 LAB — WET PREP, GENITAL
Sperm: NONE SEEN
Trich, Wet Prep: NONE SEEN
Yeast Wet Prep HPF POC: NONE SEEN

## 2016-01-11 MED ORDER — MORPHINE SULFATE (PF) 4 MG/ML IV SOLN
INTRAVENOUS | Status: AC
  Filled 2016-01-11: qty 1

## 2016-01-11 MED ORDER — MORPHINE SULFATE (PF) 4 MG/ML IV SOLN: 4.0000 mg | Freq: Once | INTRAVENOUS | Status: DC

## 2016-01-11 MED ORDER — METRONIDAZOLE 500 MG PO TABS: 500.0000 mg | ORAL_TABLET | Freq: Two times a day (BID) | ORAL | 0 refills | Status: DC

## 2016-01-11 MED ORDER — MORPHINE SULFATE (PF) 4 MG/ML IV SOLN
4.0000 mg | Freq: Once | INTRAVENOUS | Status: AC
  Administered 2016-01-11: 4 mg via INTRAVENOUS

## 2016-01-11 MED ORDER — SODIUM CHLORIDE 0.9 % IV BOLUS (SEPSIS)
1000.0000 mL | Freq: Once | INTRAVENOUS | Status: AC
  Administered 2016-01-11: 1000 mL via INTRAVENOUS

## 2016-01-11 MED ORDER — FLUCONAZOLE 150 MG PO TABS: 150.0000 mg | ORAL_TABLET | Freq: Once | ORAL | 0 refills | Status: AC

## 2016-01-11 MED ORDER — MORPHINE SULFATE (PF) 2 MG/ML IV SOLN
INTRAVENOUS | Status: AC
  Filled 2016-01-11: qty 1

## 2016-01-11 MED ORDER — MORPHINE SULFATE (PF) 2 MG/ML IV SOLN
2.0000 mg | Freq: Once | INTRAVENOUS | Status: AC
  Administered 2016-01-11: 2 mg via INTRAVENOUS

## 2016-01-11 MED ORDER — ONDANSETRON HCL 4 MG/2ML IJ SOLN
4.0000 mg | Freq: Once | INTRAMUSCULAR | Status: AC
  Administered 2016-01-11: 4 mg via INTRAVENOUS
  Filled 2016-01-11: qty 2

## 2016-01-11 NOTE — Discharge Instructions (Signed)
You were seen today for your abdominal pain. You likely have a flare up of your IBS. We recommend you see your GI doctor ASAP. We also found some bacteria in your vagina which which have given you an antibiotic for. This is not an STD. Take care!

## 2016-01-11 NOTE — Telephone Encounter (Signed)
Spoke with patient in ED. She is being evaluated currently. I have asked her to call us when she is completed.

## 2016-01-11 NOTE — Telephone Encounter (Signed)
Can we find out what patient's portion of colonoscopy would be? She would like to go ahead and be scheduled for a colonoscopy with Dr. Darrick PennaFields due to family history of colon cancer in two first-degree relatives. It would likely need to be done with Propofol. At time of her visit last week, she wanted to hold off but now she is concerned.   Do we know what deductible or out of pocket expense would be? She would like to be contacted regarding this.

## 2016-01-11 NOTE — ED Provider Notes (Signed)
[facility] DE[facility] Provider Note   CSN: [id number] Arrival date & time: 01/11/16  1057   History   Chief Complaint Chief Complaint  Patient presents with  . Abdominal Pain   HPI   [name] is a 45 y.o. female with a past medical history of diarrhea predominant IBS who presents to the ED today complaining of abdominal pain and diarrhea. She has had cramping and abdominal pain with multiple episodes of diarrhea since Saturday. Of note, she takes Bentyl for IBS, which usually controls her symptoms, but this time it is not working. She has had around 10 episodes of diarrhea in the last 24 hours. The pain generally occurs around the time of her bowel movements and resolved once she passes stool, however she has had constant pain since yesterday. She sees [facility], with her last visit in 01/07/16. She needs a colonoscopy (due to strong family hx of colon cancer) but told GI she would like to wait until January due to financial reasons.   Past Medical History:  Diagnosis Date  . Anxiety   . Arthritis    Knee  . Chronic back pain   . Chronic knee pain   . Family history of anesthesia complication    Brother and Mother woke up during surgery; Heard and felt could not speak  . GERD (gastroesophageal reflux disease)   . Headache    stress  . IBS (irritable bowel syndrome)   . Menorrhagia 09/09/2012    Patient Active Problem List   Diagnosis Date Noted  . Chronic diarrhea 11/12/2015  . Irritable bowel syndrome with diarrhea 06/19/2015  . Arthritis of knee, degenerative 12/15/2014  . Anxiety as acute reaction to exceptional stress 11/28/2014  . Chondromalacia of right knee 11/28/2014  . Rebound headache 11/06/2014  . Stressful life event affecting family 11/06/2014  . Encounter for long-term opiate analgesic use 09/15/2014  . Tinea versicolor 09/15/2014  . Chronic back pain 06/16/2014  . Arthritis of both knees 06/16/2014  . DUB (dysfunctional  uterine bleeding) 02/17/2013  . Family history of colon cancer 12/19/2012  . Dyspepsia 12/17/2012  . GERD (gastroesophageal reflux disease) 11/03/2012  . Analgesic overuse headache 11/03/2012  . Low back pain 09/09/2012  . Menorrhagia 09/09/2012  . Arthritis 07/24/2012    Past Surgical History:  Procedure Laterality Date  . CESAREAN SECTION     X2   . CHOLECYSTECTOMY N/A 01/17/2013   Procedure: LAPAROSCOPIC CHOLECYSTECTOMY;  Surgeon: Marlane Hatcher, MD;  Location: [facility] ORS;  Service: General;  Laterality: N/A;  . DILITATION & CURRETTAGE/HYSTROSCOPY WITH THERMACHOICE ABLATION N/A 02/25/2013   Procedure: DILATATION & CURETTAGE/HYSTEROSCOPY WITH THERMACHOICE ABLATION;  Surgeon: [name], MD;  Location: [facility] ORS;  Service: Gynecology;  Laterality: N/A;  Total Therapy Time:8:54 minutes Temperature:87 degrees  . ENDOMETRIAL ABLATION  12/14  . ESOPHAGOGASTRODUODENOSCOPY N/A 12/27/2012   Dr. Darrick Penna: stricture at GE junction s/p dilation, non-erosive gastritis, normal duodenum.   . TOTAL KNEE ARTHROPLASTY Right 12/15/2014   Procedure: TOTAL KNEE ARTHROPLASTY;  Surgeon: Cammy Copa, MD;  Location: [facility] OR;  Service: Orthopedics;  Laterality: Right;  . TUBAL LIGATION     along with c-section    OB History    Gravida Para Term Preterm AB Living   2 2       4    SAB TAB Ectopic Multiple Live Births         2         Home Medications    Prior to  Admission medications   Medication Sig Start Date End Date Taking? Authorizing Provider  Aspirin-Salicylamide-Caffeine (BC HEADACHE PO) Take by mouth as needed.   Yes Historical Provider, MD  colestipol (COLESTID) 1 g tablet Take 1 g by mouth 2 (two) times daily as needed (constipation).   Yes Historical Provider, MD  dexlansoprazole (DEXILANT) 60 MG capsule Take 1 tablet by mouth once daily. Patient taking differently: Take 60 mg by mouth daily as needed (acid reflux). Take 1 tablet by mouth once daily. 07/16/15  Yes Babs SciaraScott A Luking,  MD  dicyclomine (BENTYL) 10 MG capsule Take 1 capsule (10 mg total) by mouth 4 (four) times daily -  before meals and at bedtime. Patient taking differently: Take 10 mg by mouth daily as needed (flare up).  11/12/15  Yes Gelene MinkAnna W Boone, NP  gabapentin (NEURONTIN) 300 MG capsule Take 300 mg by mouth 3 (three) times daily as needed (pain).   Yes Historical Provider, MD  HYDROcodone-acetaminophen [facility](NORCO) 10-325 MG tablet 1/2 to 1 bid prn 01/07/16  Yes Campbell Richesarolyn C Hoskins, NP  DULoxetine (CYMBALTA) 60 MG capsule Take 1 capsule (60 mg total) by mouth daily. Patient not taking: Reported on 01/11/2016 06/18/15   Campbell Richesarolyn C Hoskins, NP  fluconazole (DIFLUCAN) 150 MG tablet Take 1 tablet (150 mg total) by mouth once. 01/11/16 01/11/16  Beaulah Dinninghristina M Gambino, MD  metroNIDAZOLE (FLAGYL) 500 MG tablet Take 1 tablet (500 mg total) by mouth 2 (two) times daily. 01/11/16   Beaulah Dinninghristina M Gambino, MD    Family History Family History  Problem Relation Age of Onset  . Hypertension Mother   . Diabetes Mother   . Thyroid disease Mother   . COPD Mother   . Arthritis Mother   . Depression Mother   . Mental illness Mother   . Stroke Mother   . Prostate cancer Father     unsure primary   . Colon cancer Father   . Diabetes Maternal Grandmother   . Diabetes Paternal Grandmother   . Cancer Sister   . Heart attack Brother   . Colon cancer Brother   . Diabetes Sister   . Depression Sister   . Mental illness Sister   . Colon polyps Sister     Social History Social History  Substance Use Topics  . Smoking status: Current Every Day Smoker    Packs/day: 1.00    Years: 24.00    Types: Cigarettes    Start date: 11/26/1983  . Smokeless tobacco: Never Used     Comment: quit 3 times for a total of 8 years since 1985  . Alcohol use No     Comment: socially just mixed drinks      Allergies   Doxycycline; Penicillins; Sulfa antibiotics; and Xanax [alprazolam]   Review of Systems Review of Systems  10 Systems  reviewed and are negative for acute change except as noted in the HPI.  Physical Exam Updated Vital Signs BP 131/90   Pulse 69   Temp 98.3 F (36.8 C)   Resp 18   Ht 5\' 5"  (1.651 m)   Wt 80.7 kg   LMP 12/27/2015 (Approximate)   SpO2 100%   BMI 29.62 kg/m   Physical Exam  Constitutional: She is oriented to person, place, and time. She appears well-developed and well-nourished.  HENT:  Head: Normocephalic and atraumatic.  Right Ear: External ear normal.  Left Ear: External ear normal.  Nose: Nose normal.  Mouth/Throat: Oropharynx is clear and moist.  Eyes: Conjunctivae and EOM  are normal. Pupils are equal, round, and reactive to light.  Neck: Normal range of motion. Neck supple.  Cardiovascular: Normal rate, regular rhythm, normal heart sounds and intact distal pulses.   Pulmonary/Chest: Effort normal and breath sounds normal. No respiratory distress. She has no wheezes.  Abdominal: Soft. Bowel sounds are normal. She exhibits no distension. There is no hepatosplenomegaly. There is generalized tenderness.  Genitourinary:  Genitourinary Comments: External genitalia within normal limits.  Vaginal mucosa pink, moist, normal rugae.  Nonfriable cervix without lesions, no bleeding noted on speculum exam. Small amount of white creamy discharge seen in vaginal vault. Bimanual exam revealed normal, nongravid uterus.  No cervical motion tenderness. No adnexal masses bilaterally.     Neurological: She is alert and oriented to person, place, and time. No sensory deficit. She exhibits normal muscle tone.  Skin: Skin is warm and dry. No rash noted.  Psychiatric:  Tearful affect     ED Treatments / Results  Labs (all labs ordered are listed, but only abnormal results are displayed) Labs Reviewed  WET PREP, GENITAL - Abnormal; Notable for the following:       Result Value   Clue Cells Wet Prep HPF POC PRESENT (*)    WBC, Wet Prep HPF POC MANY (*)    All other components within normal  limits  URINALYSIS, ROUTINE W REFLEX MICROSCOPIC (NOT AT [facility]) - Abnormal; Notable for the following:    APPearance HAZY (*)    Protein, ur TRACE (*)    All other components within normal limits  CBC WITH DIFFERENTIAL/PLATELET - Abnormal; Notable for the following:    RBC 5.15 (*)    Hemoglobin 15.4 (*)    All other components within normal limits  URINE MICROSCOPIC-ADD ON - Abnormal; Notable for the following:    Squamous Epithelial / LPF 6-30 (*)    Bacteria, UA FEW (*)    All other components within normal limits  COMPREHENSIVE METABOLIC PANEL  GC/CHLAMYDIA PROBE AMP ([place]) NOT AT [facility]    EKG  EKG Interpretation None       Radiology No results found.  Procedures Procedures (including critical care time)  Medications Ordered in ED Medications  sodium chloride 0.9 % bolus 1,000 mL (0 mLs Intravenous Stopped 01/11/16 1306)  morphine 4 MG/ML injection 4 mg (4 mg Intravenous Given 01/11/16 1203)  ondansetron (ZOFRAN) injection 4 mg (4 mg Intravenous Given 01/11/16 1203)  morphine 2 MG/ML injection 2 mg (2 mg Intravenous Given 01/11/16 1329)     Initial Impression / Assessment and Plan / ED Course  I have reviewed the triage vital signs and the nursing notes.  Pertinent labs & imaging results that were available during my care of the patient were reviewed by me and considered in my medical decision making (see chart for details).  Clinical Course    [name] is a 45 y.o. female with a past medical history of diarrhea predominant IBS who presents to the ED today complaining of abdominal pain and diarrhea. Vital signs within normal limits and patient afebrile. Physical exam notable for Patient was given Morphine 4 mg x 1, zofran 4 mg x 1, and 1L bolus of NS. CBC and BMP unremarkable. UA dirty catch. Pelvic exam was done which was unremarkable, GC/Chlamydia and wet prep collected. FOBT not collected as patient did not want rectal exam and she did not have any  bowel movement.   Wet prep showed clue cells and many bacteria indicating BV. Prescription for Flagyl  given for BV and Diflucan as patient states she gets yeast infections with any antibiotics. Unlikely PID based on non tender pelvic exam.  Pain likely from IBS flare up. Patient had no bowel movements or emesis while in ED and pain was controlled with morphine. Unlikely diverticulitis or appendicitis as there is no leukocytosis, patient afebrile, and had an appetite in ED. Advised close follow up with GI for imminent colonoscopy.   Final Clinical Impressions(s) / ED Diagnoses   Final diagnoses:  Generalized abdominal pain  Irritable bowel syndrome with diarrhea  Bacterial vaginosis    New Prescriptions Discharge Medication List as of 01/11/2016  3:32 PM    START taking these medications   Details  fluconazole (DIFLUCAN) 150 MG tablet Take 1 tablet (150 mg total) by mouth once., Starting Tue 01/11/2016, Print    metroNIDAZOLE (FLAGYL) 500 MG tablet Take 1 tablet (500 mg total) by mouth 2 (two) times daily., Starting Tue 01/11/2016, Print         Beaulah Dinninghristina M Gambino, MD 01/11/16 1601    Raeford RazorStephen Kohut, MD 01/15/16 1036

## 2016-01-11 NOTE — ED Triage Notes (Signed)
Pt reports has IBSD and reports has had abd pain and diarrhea Saturday night. Has been taking bentyl without relief.

## 2016-01-11 NOTE — Telephone Encounter (Signed)
PT called back and said she has left work and is headed to the ED. Said the pain continues to worsen and she is sweaty and shaky, so she is on way now to ED.

## 2016-01-11 NOTE — Telephone Encounter (Signed)
Pt called and said she is really having a lot of pain this morning with her stomach.  It is hurting in the left lower quad and cramping all over.  She is at work running a machine and she has taken a Bentyl at 5:00 am this morning and again at 9:30 am.  She wants to know if she can take another one and I told her I would ask Lewie LoronAnna Boone, NP, who is at the hospital at this time and will be in a little later.   She rates the pain at 7-8 now and said if it gets any worse she will have to leave work.  I told her if it gets worse than an 8 to go to the ED.  Her call back number is [phone].   Please advise!

## 2016-01-12 ENCOUNTER — Other Ambulatory Visit: Payer: Self-pay

## 2016-01-12 DIAGNOSIS — K529 Noninfective gastroenteritis and colitis, unspecified: Secondary | ICD-10-CM

## 2016-01-12 DIAGNOSIS — Z800 Family history of malignant neoplasm of digestive organs: Secondary | ICD-10-CM

## 2016-01-12 LAB — GC/CHLAMYDIA PROBE AMP (~~LOC~~) NOT AT ARMC
Chlamydia: NEGATIVE
Neisseria Gonorrhea: NEGATIVE

## 2016-01-12 MED ORDER — NA SULFATE-K SULFATE-MG SULF 17.5-3.13-1.6 GM/177ML PO SOLN: 1.0000 | ORAL | 0 refills | Status: DC

## 2016-01-12 NOTE — Telephone Encounter (Signed)
Pt called office and she spoke to her insurance company and she wants to schedule colonoscopy. She said she returned to work today. She is still having some stomach pain, but it is better today than yesterday. Not having as much BM today. Advised her I would get with [facility] for orders and call her back to schedule procedure.  Tobi Bastosnna, is it ok to schedule TCS w/Propofol with [facility] due to family history of colon cancer? Any additional diagnosis?

## 2016-01-12 NOTE — Telephone Encounter (Signed)
Called pt and advised her to call phone number on the back of her insurance card to find out cost for colonoscopy. Gave pt CPT code for TCS. Pt will call back if she wants it scheduled.

## 2016-01-12 NOTE — Telephone Encounter (Signed)
LMOVM and informed pt of pre-op appt 01/28/16 at 11:00am.

## 2016-01-12 NOTE — Telephone Encounter (Signed)
Yes, may schedule TCS with [facility] with Propofol. Diagnosis: family history of colon cancer and chronic diarrhea.

## 2016-01-12 NOTE — Telephone Encounter (Signed)
Spoke to pt. TCS set-up for 02/01/16 at 11:45am. Instructions mailed.

## 2016-01-12 NOTE — Patient Instructions (Signed)
PA for TCS: [id number]

## 2016-01-25 NOTE — Patient Instructions (Signed)
[name]  01/25/2016     @PREFPERIOPPHARMACY @   Your procedure is scheduled on   02/01/2016   Report [facility] at  945  A.M.  Call this number if you have problems the morning of surgery:  [phone]   Remember:  Do not eat food or drink liquids after midnight.  Take these medicines the morning of surgery with A SIP OF WATER  Dexilant, bentyl, neurontin, hydrocodone.   Do not wear jewelry, make-up or nail polish.  Do not wear lotions, powders, or perfumes, or deoderant.  Do not shave 48 hours prior to surgery.  Men may shave face and neck.  Do not bring valuables to the hospital.  [facility] is not responsible for any belongings or valuables.  Contacts, dentures or bridgework may not be worn into surgery.  Leave your suitcase in the car.  After surgery it may be brought to your room.  For patients admitted to the hospital, discharge time will be determined by your treatment team.  Patients discharged the day of surgery will not be allowed to drive home.   Name and phone number of your driver:   family Special instructions:  Follow the diet and prep instructions given to you by Dr Evelina DunField's office.  Please read over the following fact sheets that you were given. Anesthesia Post-op Instructions and Care and Recovery After Surgery       Colonoscopy, Adult A colonoscopy is an exam to look at the entire large intestine. During the exam, a lubricated, bendable tube is inserted into the anus and then passed into the rectum, colon, and other parts of the large intestine. A colonoscopy is often done as a part of normal colorectal screening or in response to certain symptoms, such as anemia, persistent diarrhea, abdominal pain, and blood in the stool. The exam can help screen for and diagnose medical problems, including:  Tumors.  Polyps.  Inflammation.  Areas of bleeding. Tell a health care provider about:  Any allergies you have.  All medicines  you are taking, including vitamins, herbs, eye drops, creams, and over-the-counter medicines.  Any problems you or family members have had with anesthetic medicines.  Any blood disorders you have.  Any surgeries you have had.  Any medical conditions you have.  Any problems you have had passing stool. What are the risks? Generally, this is a safe procedure. However, problems may occur, including:  Bleeding.  A tear in the intestine.  A reaction to medicines given during the exam.  Infection (rare). What happens before the procedure? Eating and drinking restrictions  Follow instructions from your health care provider about eating and drinking, which may include:  A few days before the procedure - follow a low-fiber diet. Avoid nuts, seeds, dried fruit, raw fruits, and vegetables.  1-3 days before the procedure - follow a clear liquid diet. Drink only clear liquids, such as clear broth or bouillon, black coffee or tea, clear juice, clear soft drinks or sports drinks, gelatin desert, and popsicles. Avoid any liquids that contain red or purple dye.  On the day of the procedure - do not eat or drink anything during the 2 hours before the procedure, or within the time period that your health care provider recommends. Bowel prep  If you were prescribed an oral bowel prep to clean out your colon:  Take it as told by your health care provider. Starting the day before your  procedure, you will need to drink a large amount of medicated liquid. The liquid will cause you to have multiple loose stools until your stool is almost clear or light green.  If your skin or anus gets irritated from diarrhea, you may use these to relieve the irritation:  Medicated wipes, such as adult wet wipes with aloe and vitamin E.  A skin soothing-product like petroleum jelly.  If you vomit while drinking the bowel prep, take a break for up to 60 minutes and then begin the bowel prep again. If vomiting continues  and you cannot take the bowel prep without vomiting, call your health care provider. General instructions  Ask your health care provider about changing or stopping your regular medicines. This is especially important if you are taking diabetes medicines or blood thinners.  Plan to have someone take you home from the hospital or clinic. What happens during the procedure?  An IV tube may be inserted into one of your veins.  You will be given medicine to help you relax (sedative).  To reduce your risk of infection:  Your health care team will wash or sanitize their hands.  Your anal area will be washed with soap.  You will be asked to lie on your side with your knees bent.  Your health care provider will lubricate a long, thin, flexible tube. The tube will have a camera and a light on the end.  The tube will be inserted into your anus.  The tube will be gently eased through your rectum and colon.  Air will be delivered into your colon to keep it open. You may feel some pressure or cramping.  The camera will be used to take images during the procedure.  A small tissue sample may be removed from your body to be examined under a microscope (biopsy). If any potential problems are found, the tissue will be sent to a lab for testing.  If small polyps are found, your health care provider may remove them and have them checked for cancer cells.  The tube that was inserted into your anus will be slowly removed. The procedure may vary among health care providers and hospitals. What happens after the procedure?  Your blood pressure, heart rate, breathing rate, and blood oxygen level will be monitored until the medicines you were given have worn off.  Do not drive for 24 hours after the exam.  You may have a small amount of blood in your stool.  You may pass gas and have mild abdominal cramping or bloating due to the air that was used to inflate your colon during the exam.  It is up to  you to get the results of your procedure. Ask your health care provider, or the department performing the procedure, when your results will be ready. This information is not intended to replace advice given to you by your health care provider. Make sure you discuss any questions you have with your health care provider. Document Released: 02/11/2000 Document Revised: 09/03/2015 Document Reviewed: 04/27/2015 Elsevier Interactive Patient Education  2017 [employer]  Colonoscopy, Adult, Care After This sheet gives you information about how to care for yourself after your procedure. Your health care provider may also give you more specific instructions. If you have problems or questions, contact your health care provider. What can I expect after the procedure? After the procedure, it is common to have:  A small amount of blood in your stool for 24 hours after the procedure.  Some gas.  Mild abdominal cramping or bloating. Follow these instructions at home: General instructions  For the first 24 hours after the procedure:  Do not drive or use machinery.  Do not sign important documents.  Do not drink alcohol.  Do your regular daily activities at a slower pace than normal.  Eat soft, easy-to-digest foods.  Rest often.  Take over-the-counter or prescription medicines only as told by your health care provider.  It is up to you to get the results of your procedure. Ask your health care provider, or the department performing the procedure, when your results will be ready. Relieving cramping and bloating  Try walking around when you have cramps or feel bloated.  Apply heat to your abdomen as told by your health care provider. Use a heat source that your health care provider recommends, such as a moist heat pack or a heating pad.  Place a towel between your skin and the heat source.  Leave the heat on for 20-30 minutes.  Remove the heat if your skin turns bright red. This is  especially important if you are unable to feel pain, heat, or cold. You may have a greater risk of getting burned. Eating and drinking  Drink enough fluid to keep your urine clear or pale yellow.  Resume your normal diet as instructed by your health care provider. Avoid heavy or fried foods that are hard to digest.  Avoid drinking alcohol for as long as instructed by your health care provider. Contact a health care provider if:  You have blood in your stool 2-3 days after the procedure. Get help right away if:  You have more than a small spotting of blood in your stool.  You pass large blood clots in your stool.  Your abdomen is swollen.  You have nausea or vomiting.  You have a fever.  You have increasing abdominal pain that is not relieved with medicine. This information is not intended to replace advice given to you by your health care provider. Make sure you discuss any questions you have with your health care provider. Document Released: 09/28/2003 Document Revised: 11/08/2015 Document Reviewed: 04/27/2015 Elsevier Interactive Patient Education  2017 [employer]  Monitored Anesthesia Care Anesthesia is a term that refers to techniques, procedures, and medicines that help a person stay safe and comfortable during a medical procedure. Monitored anesthesia care, or sedation, is one type of anesthesia. Your anesthesia specialist may recommend sedation if you will be having a procedure that does not require you to be unconscious, such as:  Cataract surgery.  A dental procedure.  A biopsy.  A colonoscopy. During the procedure, you may receive a medicine to help you relax (sedative). There are three levels of sedation:  Mild sedation. At this level, you may feel awake and relaxed. You will be able to follow directions.  Moderate sedation. At this level, you will be sleepy. You may not remember the procedure.  Deep sedation. At this level, you will be asleep. You will not  remember the procedure. The more medicine you are given, the deeper your level of sedation will be. Depending on how you respond to the procedure, the anesthesia specialist may change your level of sedation or the type of anesthesia to fit your needs. An anesthesia specialist will monitor you closely during the procedure. Let your health care provider know about:  Any allergies you have.  All medicines you are taking, including vitamins, herbs, eye drops, creams, and over-the-counter medicines.  Any  use of steroids (by mouth or as a cream).  Any problems you or family members have had with sedatives and anesthetic medicines.  Any blood disorders you have.  Any surgeries you have had.  Any medical conditions you have, such as sleep apnea.  Whether you are pregnant or may be pregnant.  Any use of cigarettes, alcohol, or street drugs. What are the risks? Generally, this is a safe procedure. However, problems may occur, including:  Getting too much medicine (oversedation).  Nausea.  Allergic reaction to medicines.  Trouble breathing. If this happens, a breathing tube may be used to help with breathing. It will be removed when you are awake and breathing on your own.  Heart trouble.  Lung trouble. Before the procedure Staying hydrated  Follow instructions from your health care provider about hydration, which may include:  Up to 2 hours before the procedure - you may continue to drink clear liquids, such as water, clear fruit juice, black coffee, and plain tea. Eating and drinking restrictions  Follow instructions from your health care provider about eating and drinking, which may include:  8 hours before the procedure - stop eating heavy meals or foods such as meat, fried foods, or fatty foods.  6 hours before the procedure - stop eating light meals or foods, such as toast or cereal.  6 hours before the procedure - stop drinking milk or drinks that contain milk.  2 hours  before the procedure - stop drinking clear liquids. Medicines  Ask your health care provider about:  Changing or stopping your regular medicines. This is especially important if you are taking diabetes medicines or blood thinners.  Taking medicines such as aspirin and ibuprofen. These medicines can thin your blood. Do not take these medicines before your procedure if your health care provider instructs you not to. Tests and exams  You will have a physical exam.  You may have blood tests done to show:  How well your kidneys and liver are working.  How well your blood can clot.  General instructions  Plan to have someone take you home from the hospital or clinic.  If you will be going home right after the procedure, plan to have someone with you for 24 hours. What happens during the procedure?  Your blood pressure, heart rate, breathing, level of pain and overall condition will be monitored.  An IV tube will be inserted into one of your veins.  Your anesthesia specialist will give you medicines as needed to keep you comfortable during the procedure. This may mean changing the level of sedation.  The procedure will be performed. After the procedure  Your blood pressure, heart rate, breathing rate, and blood oxygen level will be monitored until the medicines you were given have worn off.  Do not drive for 24 hours if you received a sedative.  You may:  Feel sleepy, clumsy, or nauseous.  Feel forgetful about what happened after the procedure.  Have a sore throat if you had a breathing tube during the procedure.  Vomit. This information is not intended to replace advice given to you by your health care provider. Make sure you discuss any questions you have with your health care provider. Document Released: 11/09/2004 Document Revised: 07/23/2015 Document Reviewed: 06/06/2015 Elsevier Interactive Patient Education  2017 [employer] PATIENT  INSTRUCTIONS POST-ANESTHESIA  IMMEDIATELY FOLLOWING SURGERY:  Do not drive or operate machinery for the first twenty four hours after surgery.  Do not make any important decisions for  twenty four hours after surgery or while taking narcotic pain medications or sedatives.  If you develop intractable nausea and vomiting or a severe headache please notify your doctor immediately.  FOLLOW-UP:  Please make an appointment with your surgeon as instructed. You do not need to follow up with anesthesia unless specifically instructed to do so.  WOUND CARE INSTRUCTIONS (if applicable):  Keep a dry clean dressing on the anesthesia/puncture wound site if there is drainage.  Once the wound has quit draining you may leave it open to air.  Generally you should leave the bandage intact for twenty four hours unless there is drainage.  If the epidural site drains for more than 36-48 hours please call the anesthesia department.  QUESTIONS?:  Please feel free to call your physician or the hospital operator if you have any questions, and they will be happy to assist you.

## 2016-01-28 ENCOUNTER — Encounter (HOSPITAL_COMMUNITY): Payer: Self-pay

## 2016-01-28 ENCOUNTER — Other Ambulatory Visit: Payer: Self-pay

## 2016-01-28 ENCOUNTER — Encounter (HOSPITAL_COMMUNITY)
Admission: RE | Admit: 2016-01-28 | Discharge: 2016-01-28 | Disposition: A | Discharge status: Home / Self Care | Payer: 59 | Source: Ambulatory Visit | Attending: Gastroenterology | Admitting: Gastroenterology

## 2016-01-28 DIAGNOSIS — K529 Noninfective gastroenteritis and colitis, unspecified: Secondary | ICD-10-CM | POA: Diagnosis not present

## 2016-01-28 DIAGNOSIS — Z800 Family history of malignant neoplasm of digestive organs: Secondary | ICD-10-CM | POA: Diagnosis not present

## 2016-01-28 DIAGNOSIS — Z01818 Encounter for other preprocedural examination: Secondary | ICD-10-CM | POA: Insufficient documentation

## 2016-02-01 ENCOUNTER — Ambulatory Visit (HOSPITAL_COMMUNITY): Payer: 59 | Admitting: Anesthesiology

## 2016-02-01 ENCOUNTER — Encounter (HOSPITAL_COMMUNITY): Payer: Self-pay | Admitting: *Deleted

## 2016-02-01 ENCOUNTER — Ambulatory Visit (HOSPITAL_COMMUNITY)
Admission: RE | Admit: 2016-02-01 | Discharge: 2016-02-01 | Disposition: A | Discharge status: Home / Self Care | Payer: 59 | Source: Ambulatory Visit | Attending: Gastroenterology | Admitting: Gastroenterology

## 2016-02-01 ENCOUNTER — Encounter (HOSPITAL_COMMUNITY)
Admission: RE | Disposition: A | Discharge status: Home / Self Care | Payer: Self-pay | Source: Ambulatory Visit | Attending: Gastroenterology

## 2016-02-01 DIAGNOSIS — Z8261 Family history of arthritis: Secondary | ICD-10-CM | POA: Insufficient documentation

## 2016-02-01 DIAGNOSIS — F17210 Nicotine dependence, cigarettes, uncomplicated: Secondary | ICD-10-CM | POA: Insufficient documentation

## 2016-02-01 DIAGNOSIS — M549 Dorsalgia, unspecified: Secondary | ICD-10-CM | POA: Insufficient documentation

## 2016-02-01 DIAGNOSIS — K648 Other hemorrhoids: Secondary | ICD-10-CM | POA: Diagnosis not present

## 2016-02-01 DIAGNOSIS — F419 Anxiety disorder, unspecified: Secondary | ICD-10-CM | POA: Insufficient documentation

## 2016-02-01 DIAGNOSIS — Z881 Allergy status to other antibiotic agents status: Secondary | ICD-10-CM | POA: Insufficient documentation

## 2016-02-01 DIAGNOSIS — Z800 Family history of malignant neoplasm of digestive organs: Secondary | ICD-10-CM | POA: Diagnosis not present

## 2016-02-01 DIAGNOSIS — Z882 Allergy status to sulfonamides status: Secondary | ICD-10-CM | POA: Insufficient documentation

## 2016-02-01 DIAGNOSIS — Z818 Family history of other mental and behavioral disorders: Secondary | ICD-10-CM | POA: Diagnosis not present

## 2016-02-01 DIAGNOSIS — Z880 Allergy status to penicillin: Secondary | ICD-10-CM | POA: Insufficient documentation

## 2016-02-01 DIAGNOSIS — Z833 Family history of diabetes mellitus: Secondary | ICD-10-CM | POA: Diagnosis not present

## 2016-02-01 DIAGNOSIS — Z7982 Long term (current) use of aspirin: Secondary | ICD-10-CM | POA: Diagnosis not present

## 2016-02-01 DIAGNOSIS — R197 Diarrhea, unspecified: Secondary | ICD-10-CM

## 2016-02-01 DIAGNOSIS — M25569 Pain in unspecified knee: Secondary | ICD-10-CM | POA: Diagnosis not present

## 2016-02-01 DIAGNOSIS — Z8249 Family history of ischemic heart disease and other diseases of the circulatory system: Secondary | ICD-10-CM | POA: Diagnosis not present

## 2016-02-01 DIAGNOSIS — Z888 Allergy status to other drugs, medicaments and biological substances status: Secondary | ICD-10-CM | POA: Insufficient documentation

## 2016-02-01 DIAGNOSIS — Z9049 Acquired absence of other specified parts of digestive tract: Secondary | ICD-10-CM | POA: Insufficient documentation

## 2016-02-01 DIAGNOSIS — K529 Noninfective gastroenteritis and colitis, unspecified: Secondary | ICD-10-CM | POA: Insufficient documentation

## 2016-02-01 DIAGNOSIS — Z8371 Family history of colonic polyps: Secondary | ICD-10-CM | POA: Insufficient documentation

## 2016-02-01 DIAGNOSIS — G8929 Other chronic pain: Secondary | ICD-10-CM | POA: Diagnosis not present

## 2016-02-01 DIAGNOSIS — Z9851 Tubal ligation status: Secondary | ICD-10-CM | POA: Insufficient documentation

## 2016-02-01 DIAGNOSIS — K589 Irritable bowel syndrome without diarrhea: Secondary | ICD-10-CM | POA: Insufficient documentation

## 2016-02-01 DIAGNOSIS — K5730 Diverticulosis of large intestine without perforation or abscess without bleeding: Secondary | ICD-10-CM

## 2016-02-01 DIAGNOSIS — K219 Gastro-esophageal reflux disease without esophagitis: Secondary | ICD-10-CM | POA: Insufficient documentation

## 2016-02-01 DIAGNOSIS — Z825 Family history of asthma and other chronic lower respiratory diseases: Secondary | ICD-10-CM | POA: Insufficient documentation

## 2016-02-01 DIAGNOSIS — Z823 Family history of stroke: Secondary | ICD-10-CM | POA: Diagnosis not present

## 2016-02-01 HISTORY — PX: COLONOSCOPY WITH PROPOFOL: SHX5780

## 2016-02-01 HISTORY — PX: BIOPSY: SHX5522

## 2016-02-01 SURGERY — COLONOSCOPY WITH PROPOFOL
Anesthesia: Monitor Anesthesia Care

## 2016-02-01 MED ORDER — GLYCOPYRROLATE 0.2 MG/ML IJ SOLN
0.2000 mg | Freq: Once | INTRAMUSCULAR | Status: AC | PRN
  Administered 2016-02-01: 0.2 mg via INTRAVENOUS
  Filled 2016-02-01: qty 1

## 2016-02-01 MED ORDER — LACTATED RINGERS IV SOLN
INTRAVENOUS | Status: DC
  Administered 2016-02-01: 1000 mL via INTRAVENOUS

## 2016-02-01 MED ORDER — FENTANYL CITRATE (PF) 100 MCG/2ML IJ SOLN
25.0000 ug | INTRAMUSCULAR | Status: AC | PRN
  Administered 2016-02-01 (×2): 25 ug via INTRAVENOUS

## 2016-02-01 MED ORDER — LIDOCAINE HCL (CARDIAC) 10 MG/ML IV SOLN
INTRAVENOUS | Status: DC | PRN
  Administered 2016-02-01: 50 mg via INTRAVENOUS

## 2016-02-01 MED ORDER — MIDAZOLAM HCL 5 MG/5ML IJ SOLN
INTRAMUSCULAR | Status: DC | PRN
  Administered 2016-02-01: 2 mg via INTRAVENOUS

## 2016-02-01 MED ORDER — MIDAZOLAM HCL 2 MG/2ML IJ SOLN
INTRAMUSCULAR | Status: AC
  Filled 2016-02-01: qty 2

## 2016-02-01 MED ORDER — PROPOFOL 500 MG/50ML IV EMUL
INTRAVENOUS | Status: DC | PRN
  Administered 2016-02-01: 75 ug/kg/min via INTRAVENOUS

## 2016-02-01 MED ORDER — LIDOCAINE HCL (PF) 1 % IJ SOLN
INTRAMUSCULAR | Status: AC
  Filled 2016-02-01: qty 5

## 2016-02-01 MED ORDER — PROPOFOL 10 MG/ML IV BOLUS
INTRAVENOUS | Status: DC | PRN
  Administered 2016-02-01: 20 mg via INTRAVENOUS

## 2016-02-01 MED ORDER — MIDAZOLAM HCL 2 MG/2ML IJ SOLN
1.0000 mg | INTRAMUSCULAR | Status: DC | PRN
  Administered 2016-02-01: 2 mg via INTRAVENOUS

## 2016-02-01 MED ORDER — PAROXETINE HCL 20 MG PO TABS: ORAL_TABLET | ORAL | 11 refills | Status: DC

## 2016-02-01 MED ORDER — CHLORHEXIDINE GLUCONATE CLOTH 2 % EX PADS: 6.0000 | MEDICATED_PAD | Freq: Once | CUTANEOUS | Status: DC

## 2016-02-01 MED ORDER — FENTANYL CITRATE (PF) 100 MCG/2ML IJ SOLN
INTRAMUSCULAR | Status: AC
  Filled 2016-02-01: qty 2

## 2016-02-01 NOTE — Anesthesia Postprocedure Evaluation (Signed)
Anesthesia Post Note  Patient: Clent Jacksammy M Dutko  Procedure(s) Performed: Procedure(s) (LRB): COLONOSCOPY WITH PROPOFOL (N/A) BIOPSY  Patient location during evaluation: PACU Anesthesia Type: MAC Level of consciousness: awake Pain management: satisfactory to patient Vital Signs Assessment: post-procedure vital signs reviewed and stable Respiratory status: spontaneous breathing Cardiovascular status: stable Anesthetic complications: no    Last Vitals:  Vitals:   02/01/16 1130 02/01/16 1135  BP: 110/76 125/79  Pulse:    Resp: 13 16  Temp:      Last Pain:  Vitals:   02/01/16 1015  TempSrc: Oral                 Fajr Fife

## 2016-02-01 NOTE — Op Note (Signed)
[facility] Patient Name: [name] Procedure Date: 02/01/2016 11:46 AM MRN: [id number] Date of Birth: 15-May-1970 Attending MD: Jonette EvaSandi Delona Clasby , MD CSN: [id number] Age: [age] Admit Type: Outpatient Procedure:                Colonoscopy with RANDOM COLD FORCEPS BIOPSY Indications:              Chronic diarrhea-CHANGE IN BOWEL HABITS-DIARRHEA.                            STRESS ON JOB. TROUBLE WITH LOOSE STOOL SINCE GB                            REMOVED 3 YRS AGO. Brother HAD colon cancer AGE <                            60. FATHER HAD COLON CANCER AGE > 60. WEIGHT DOWN 4                            LBS SINCE FEB 2016: BMI 29.7. Providers:                Jonette EvaSandi Tritia Endo, MD, Criselda PeachesLurae B. Patsy LagerAlbert RN, RN, Birder Robsonebra                            [place], Technician Referring MD:             Jonna CoupScott A. Luking Medicines:                Propofol per Anesthesia Complications:            No immediate complications. Estimated Blood Loss:     Estimated blood loss was minimal. Procedure:                Pre-Anesthesia Assessment:                           - Prior to the procedure, a History and Physical                            was performed, and patient medications and                            allergies were reviewed. The patient's tolerance of                            previous anesthesia was also reviewed. The risks                            and benefits of the procedure and the sedation                            options and risks were discussed with the patient.                            All questions were answered, and informed consent  was obtained. Prior Anticoagulants: The patient has                            taken aspirin, last dose was 1 day prior to                            procedure. ASA Grade Assessment: II - A patient                            with mild systemic disease. After reviewing the                            risks and benefits, the patient was deemed  in                            satisfactory condition to undergo the procedure.                            After obtaining informed consent, the colonoscope                            was passed under direct vision. Throughout the                            procedure, the patient's blood pressure, pulse, and                            oxygen saturations were monitored continuously. The                            Colonoscope was introduced through the anus and                            advanced to the 10 cm into the ileum. The terminal                            ileum, ileocecal valve, appendiceal orifice, and                            rectum were photographed. The colonoscopy was                            somewhat difficult due to a tortuous colon.                            Successful completion of the procedure was aided by                            COLOWRAP. The patient tolerated the procedure well.                            The quality of the bowel preparation was excellent. Scope In: 11:54:39 AM Scope Out: 12:09:05 PM Scope Withdrawal  Time: 0 hours 10 minutes 54 seconds  Total Procedure Duration: 0 hours 14 minutes 26 seconds  Findings:      The terminal ileum appeared normal.      The recto-sigmoid colon was mildly redundant. This was biopsied with a       cold forceps for evaluation of microscopic colitis.      A few small-mouthed diverticula were found in the sigmoid colon.      Internal hemorrhoids were found during retroflexion. The hemorrhoids       were small. Impression:               - The examined portion of the ileum was normal.                           - SLIGHTLY Redundant LEFT colon.                           - Mild diverticulosis in the sigmoid colon.                           - Internal hemorrhoids. Moderate Sedation:      Per Anesthesia Care Recommendation:           - Low fat diet.                           - Continue present medications.                            - Await pathology results.                           - Repeat colonoscopy in 5 years for surveillance.                           - Return to GI office in 4 months.                           - ADD PAXIL 10 MG DAILY FOR 2 WEEKS AND INCREASE TO                            20 M DAILY IF DIARRHEA PERSISTS. STOP BENTYL. USE                            COLESTID PRN.                           - Patient has a contact number available for                            emergencies. The signs and symptoms of potential                            delayed complications were discussed with the                            patient. Return to normal  activities tomorrow.                            Written discharge instructions were provided to the                            patient. Procedure Code(s):        --- Professional ---                           [phone], Colonoscopy, flexible; with biopsy, single                            or multiple Diagnosis Code(s):        --- Professional ---                           K64.8, Other hemorrhoids                           K52.9, Noninfective gastroenteritis and colitis,                            unspecified                           K57.30, Diverticulosis of large intestine without                            perforation or abscess without bleeding                           Q43.8, Other specified congenital malformations of                            intestine CPT copyright 2016 American Medical Association. All rights reserved. The codes documented in this report are preliminary and upon coder review may  be revised to meet current compliance requirements. Jonette EvaSandi Bennett Ram, MD Jonette EvaSandi Gisella Alwine, MD 02/01/2016 12:31:04 PM This report has been signed electronically. Number of Addenda: 0

## 2016-02-01 NOTE — Anesthesia Preprocedure Evaluation (Signed)
Anesthesia Evaluation  Patient identified by MRN, date of birth, ID band Patient awake    Reviewed: Allergy & Precautions, NPO status , Patient's Chart, lab work & pertinent test results  History of Anesthesia Complications (+) Family history of anesthesia reaction  Airway Mallampati: I  TM Distance: >3 FB     Dental  (+) Teeth Intact   Pulmonary Current Smoker,    Pulmonary exam normal        Cardiovascular Normal cardiovascular exam     Neuro/Psych  Headaches, PSYCHIATRIC DISORDERS Anxiety    GI/Hepatic Neg liver ROS, GERD  Medicated and Controlled,  Endo/Other  negative endocrine ROS  Renal/GU negative Renal ROS     Musculoskeletal  (+) Arthritis ,   Abdominal   Peds  Hematology negative hematology ROS (+)   Anesthesia Other Findings   Reproductive/Obstetrics                             Anesthesia Physical Anesthesia Plan  ASA: II  Anesthesia Plan: MAC   Post-op Pain Management:    Induction: Intravenous  Airway Management Planned: Simple Face Mask  Additional Equipment:   Intra-op Plan:   Post-operative Plan:   Informed Consent: I have reviewed the patients History and Physical, chart, labs and discussed the procedure including the risks, benefits and alternatives for the proposed anesthesia with the patient or authorized representative who has indicated his/her understanding and acceptance.     Plan Discussed with:   Anesthesia Plan Comments:         Anesthesia Quick Evaluation

## 2016-02-01 NOTE — H&P (Addendum)
Primary Care Physician:  Sallee Lange, MD Primary Gastroenterologist:  Dr. Oneida Alar  Pre-Procedure History & Physical: HPI:  [name] is a 45 y.o. female here for CHANGE IN BOWEL HABITS-DIARRHEA. STRESS ON JOB. TROUBLE WITH LOOSE STOOL SINCE GB REMOVED. Brother HAD colon cancer AGE < 20. FATHER HAD COLON CANCER AGE > 60.  Past Medical History:  Diagnosis Date  . Anxiety   . Arthritis    Knee  . Chronic back pain   . Chronic knee pain   . Family history of anesthesia complication    Brother and Mother woke up during surgery; Heard and felt could not speak  . GERD (gastroesophageal reflux disease)   . Headache    stress  . IBS (irritable bowel syndrome)   . Menorrhagia 09/09/2012    Past Surgical History:  Procedure Laterality Date  . CESAREAN SECTION     X2   . CHOLECYSTECTOMY N/A 01/17/2013   Procedure: LAPAROSCOPIC CHOLECYSTECTOMY;  Surgeon: Scherry Ran, MD;  Location: [facility];  Service: General;  Laterality: N/A;  . DILITATION & CURRETTAGE/HYSTROSCOPY WITH THERMACHOICE ABLATION N/A 02/25/2013   Procedure: DILATATION & CURETTAGE/HYSTEROSCOPY WITH THERMACHOICE ABLATION;  Surgeon: Jonnie Kind, MD;  Location: [facility];  Service: Gynecology;  Laterality: N/A;  Total Therapy Time:8:54 minutes Temperature:87 degrees  . ENDOMETRIAL ABLATION  12/14  . ESOPHAGOGASTRODUODENOSCOPY N/A 12/27/2012   Dr. Oneida Alar: stricture at GE junction s/p dilation, non-erosive gastritis, normal duodenum.   . TOTAL KNEE ARTHROPLASTY Right 12/15/2014   Procedure: TOTAL KNEE ARTHROPLASTY;  Surgeon: Meredith Pel, MD;  Location: [place];  Service: Orthopedics;  Laterality: Right;  . TUBAL LIGATION     along with c-section    Prior to Admission medications   Medication Sig Start Date End Date Taking? Authorizing Provider  Aspirin-Salicylamide-Caffeine (BC HEADACHE PO) Take 1 packet by mouth as needed (headaches).    Yes Historical Provider, MD  colestipol (COLESTID) 1 g tablet Take 1 g by  mouth 2 (two) times daily as needed (ibsd).    Yes Historical Provider, MD  dexlansoprazole (DEXILANT) 60 MG capsule Take 1 tablet by mouth once daily. Patient taking differently: Take 60 mg by mouth daily as needed (acid reflux). Take 1 tablet by mouth once daily. 07/16/15  Yes Kathyrn Drown, MD  dicyclomine (BENTYL) 10 MG capsule Take 1 capsule (10 mg total) by mouth 4 (four) times daily -  before meals and at bedtime. Patient taking differently: Take 10 mg by mouth 4 (four) times daily as needed (flare up).  11/12/15  Yes Annitta Needs, NP  gabapentin (NEURONTIN) 300 MG capsule Take 300 mg by mouth 3 (three) times daily as needed (pain).   Yes Historical Provider, MD  HYDROcodone-acetaminophen (NORCO) 10-325 MG tablet 1/2 to 1 bid prn Patient taking differently: Take 0.5-1 tablets by mouth 2 (two) times daily as needed for moderate pain or severe pain.  01/07/16  Yes Nilda Simmer, NP  Na Sulfate-K Sulfate-Mg Sulf (SUPREP BOWEL PREP KIT) 17.5-3.13-1.6 GM/180ML SOLN Take 1 kit by mouth as directed. 01/12/16  Yes Danie Binder, MD  metroNIDAZOLE (FLAGYL) 500 MG tablet Take 1 tablet (500 mg total) by mouth 2 (two) times daily. Patient not taking: Reported on 01/24/2016 01/11/16   Carlyle Dolly, MD    Allergies as of 01/12/2016 - Review Complete 01/11/2016  Allergen Reaction Noted  . Doxycycline Nausea And Vomiting 01/11/2016  . Penicillins Nausea And Vomiting 06/28/2012  . Sulfa antibiotics Swelling 06/28/2012  . Xanax [  alprazolam] Other (See Comments) 06/28/2012    Family History  Problem Relation Age of Onset  . Hypertension Mother   . Diabetes Mother   . Thyroid disease Mother   . COPD Mother   . Arthritis Mother   . Depression Mother   . Mental illness Mother   . Stroke Mother   . Prostate cancer Father     unsure primary   . Colon cancer Father   . Diabetes Maternal Grandmother   . Diabetes Paternal Grandmother   . Cancer Sister   . Heart attack Brother   . Colon  cancer Brother   . Diabetes Sister   . Depression Sister   . Mental illness Sister   . Colon polyps Sister     Social History   Social History  . Marital status: Divorced    Spouse name: N/A  . Number of children: N/A  . Years of education: N/A   Occupational History  . Not on file.   Social History Main Topics  . Smoking status: Current Every Day Smoker    Packs/day: 1.00    Years: 24.00    Types: Cigarettes    Start date: 11/26/1983  . Smokeless tobacco: Never Used     Comment: quit 3 times for a total of 8 years since 1985  . Alcohol use No     Comment: socially just mixed drinks   . Drug use: No  . Sexual activity: Yes    Partners: Male    Birth control/ protection: Surgical   Other Topics Concern  . Not on file   Social History Narrative  . No narrative on file    Review of Systems: See HPI, otherwise negative ROS   Physical Exam: BP 100/64   Pulse 86   Temp 98.1 F (36.7 C) (Oral)   Resp 14   Ht '5\' 5"'  (1.651 m)   Wt 180 lb (81.6 kg)   LMP 01/23/2016   SpO2 95%   BMI 29.95 kg/m  General:   Alert,  pleasant and cooperative in NAD Head:  Normocephalic and atraumatic. Neck:  Supple; Lungs:  Clear throughout to auscultation.    Heart:  Regular rate and rhythm. Abdomen:  Soft, nontender and nondistended. Normal bowel sounds, without guarding, and without rebound.   Neurologic:  Alert and  oriented x4;  grossly normal neurologically.  Impression/Plan:     CHANGE IN BOWEL HABITS-DIARRHEA. STRESS ON JOB. TROUBLE WITH LOOSE STOOL SINCE GB REMOVED. Brother HAD colon cancer AGE < 62. FATHER HAD COLON CANCER AGE > 60.    Plan:  1. TCS TODAy. DISCUSSED PROCEDURE, BENEFITS, & RISKS: < 1% chance of medication reaction, bleeding, perforation, or rupture of spleen/liver.

## 2016-02-01 NOTE — Anesthesia Procedure Notes (Signed)
Procedure Name: MAC Date/Time: 02/01/2016 11:37 AM Performed by: Franco NonesYATES, Kamia Insalaco S Pre-anesthesia Checklist: Patient identified, Emergency Drugs available, Suction available, Timeout performed and Patient being monitored Patient Re-evaluated:Patient Re-evaluated prior to inductionOxygen Delivery Method: Non-rebreather mask

## 2016-02-01 NOTE — Transfer of Care (Signed)
Immediate Anesthesia Transfer of Care Note  Patient: [name]  Procedure(s) Performed: Procedure(s) with comments: COLONOSCOPY WITH PROPOFOL (N/A) - 11:45 am BIOPSY - random colon biopsy  Patient Location: PACU  Anesthesia Type:MAC  Level of Consciousness: patient cooperative  Airway & Oxygen Therapy: Patient Spontanous Breathing  Post-op Assessment: Report given to RN and Post -op Vital signs reviewed and stable  Post vital signs: Reviewed and stable  Last Vitals:  Vitals:   02/01/16 1130 02/01/16 1135  BP: 110/76 125/79  Pulse:    Resp: 13 16  Temp:      Last Pain:  Vitals:   02/01/16 1015  TempSrc: Oral      Patients Stated Pain Goal: 7 (02/01/16 1015)  Complications: No apparent anesthesia complications

## 2016-02-01 NOTE — Discharge Instructions (Signed)
THE LAST PART OF YOUR SMALL BOWEL IS NORMAL. YOUR LOOSE STOOLS ARE MOST LIKELY DUE TO NOT HAVING A GALLBladder AND/OR or IBS. You have  SMALL internal hemorrhoids and diverticulosis IN YOUR LEFT COLON. I BIOPSIED YOUR COLON.   DRINK WATER TO KEEP YOUR URINE LIGHT YELLOW.  STRICTLY FOLLOW A HIGH FIBER/LOW FAT DIET. AVOID ITEMS THAT CAUSE BLOATING. SEE INFO BELOW.  STOP THE BENTYL.  START PAXIL 10 MG DAILY. IF DIARRHEA CONTINUES AFTER 2 WEEKS, INCREASE TO 20 MG DAILY. IT MAY CAUSE CONSTIPATION. PLEASE CALL WITH QUESTIONS OR CONCERNS.  IF NEEDED, CHEW ONE TUMS WITH MEALS THREE TIMES A DAY TO PREVENT DIARRJHEA FATER EATING.  YOUR BIOPSY RESULTS WILL BE AVAILABLE IN MY CHART AFTER DEC 8 AND MY OFFICE WILL CONTACT YOU IN 10-14 DAYS WITH YOUR RESULTS.   FOLLOW UP IN 4 MOS.   Next colonoscopy in 5 years.  Colonoscopy Care After Read the instructions outlined below and refer to this sheet in the next week. These discharge instructions provide you with general information on caring for yourself after you leave the hospital. While your treatment has been planned according to the most current medical practices available, unavoidable complications occasionally occur. If you have any problems or questions after discharge, call DR. Laurette Villescas, [phone].  ACTIVITY  You may resume your regular activity, but move at a slower pace for the next 24 hours.   Take frequent rest periods for the next 24 hours.   Walking will help get rid of the air and reduce the bloated feeling in your belly (abdomen).   No driving for 24 hours (because of the medicine (anesthesia) used during the test).   You may shower.   Do not sign any important legal documents or operate any machinery for 24 hours (because of the anesthesia used during the test).    NUTRITION  Drink plenty of fluids.   You may resume your normal diet as instructed by your doctor.   Begin with a light meal and progress to your normal diet.  Heavy or fried foods are harder to digest and may make you feel sick to your stomach (nauseated).   Avoid alcoholic beverages for 24 hours or as instructed.    MEDICATIONS  You may resume your normal medications.   WHAT YOU CAN EXPECT TODAY  Some feelings of bloating in the abdomen.   Passage of more gas than usual.   Spotting of blood in your stool or on the toilet paper  .  IF YOU HAD POLYPS REMOVED DURING THE COLONOSCOPY:  Eat a soft diet IF YOU HAVE NAUSEA, BLOATING, ABDOMINAL PAIN, OR VOMITING.    FINDING OUT THE RESULTS OF YOUR TEST Not all test results are available during your visit. DR. Darrick PennaFIELDS WILL CALL YOU WITHIN 14 DAYS OF YOUR PROCEDUE WITH YOUR RESULTS. Do not assume everything is normal if you have not heard from DR. Jaleia Hanke, CALL HER OFFICE AT [phone].  SEEK IMMEDIATE MEDICAL ATTENTION AND CALL THE OFFICE: [phone] IF:  You have more than a spotting of blood in your stool.   Your belly is swollen (abdominal distention).   You are nauseated or vomiting.   You have a temperature over 101F.   You have abdominal pain or discomfort that is severe or gets worse throughout the day.  Polyps, Colon  A polyp is extra tissue that grows inside your body. Colon polyps grow in the large intestine. The large intestine, also called the colon, is part of your digestive system.  It is a long, hollow tube at the end of your digestive tract where your body makes and stores stool. Most polyps are not dangerous. They are benign. This means they are not cancerous. But over time, some types of polyps can turn into cancer. Polyps that are smaller than a pea are usually not harmful. But larger polyps could someday become or may already be cancerous. To be safe, doctors remove all polyps and test them.   PREVENTION There is not one sure way to prevent polyps. You might be able to lower your risk of getting them if you:  Eat more fruits and vegetables and less fatty food.    Do not smoke.   Avoid alcohol.   Exercise every day.   Lose weight if you are overweight.   Eating more calcium and folate can also lower your risk of getting polyps. Some foods that are rich in calcium are milk, cheese, and broccoli. Some foods that are rich in folate are chickpeas, kidney beans, and spinach.   High-Fiber Diet A high-fiber diet changes your normal diet to include more whole grains, legumes, fruits, and vegetables. Changes in the diet involve replacing refined carbohydrates with unrefined foods. The calorie level of the diet is essentially unchanged. The Dietary Reference Intake (recommended amount) for adult males is 38 grams per day. For adult females, it is 25 grams per day. Pregnant and lactating women should consume 28 grams of fiber per day. Fiber is the intact part of a plant that is not broken down during digestion. Functional fiber is fiber that has been isolated from the plant to provide a beneficial effect in the body. PURPOSE  Increase stool bulk.   Ease and regulate bowel movements.   Lower cholesterol.   REDUCE RISK OF COLON CANCER'  INDICATIONS THAT YOU NEED MORE FIBER  Constipation and hemorrhoids.   Uncomplicated diverticulosis (intestine condition) and irritable bowel syndrome.   Weight management.   As a protective measure against hardening of the arteries (atherosclerosis), diabetes, and cancer.   GUIDELINES FOR INCREASING FIBER IN THE DIET  Start adding fiber to the diet slowly. A gradual increase of about 5 more grams (2 slices of whole-wheat bread, 2 servings of most fruits or vegetables, or 1 bowl of high-fiber cereal) per day is best. Too rapid an increase in fiber may result in constipation, flatulence, and bloating.   Drink enough water and fluids to keep your urine clear or pale yellow. Water, juice, or caffeine-free drinks are recommended. Not drinking enough fluid may cause constipation.   Eat a variety of high-fiber foods  rather than one type of fiber.   Try to increase your intake of fiber through using high-fiber foods rather than fiber pills or supplements that contain small amounts of fiber.   The goal is to change the types of food eaten. Do not supplement your present diet with high-fiber foods, but replace foods in your present diet.   INCLUDE A VARIETY OF FIBER SOURCES  Replace refined and processed grains with whole grains, canned fruits with fresh fruits, and incorporate other fiber sources. White rice, white breads, and most bakery goods contain little or no fiber.   Brown whole-grain rice, buckwheat oats, and many fruits and vegetables are all good sources of fiber. These include: broccoli, Brussels sprouts, cabbage, cauliflower, beets, sweet potatoes, white potatoes (skin on), carrots, tomatoes, eggplant, squash, berries, fresh fruits, and dried fruits.   Cereals appear to be the richest source of fiber. Cereal fiber is  found in whole grains and bran. Bran is the fiber-rich outer coat of cereal grain, which is largely removed in refining. In whole-grain cereals, the bran remains. In breakfast cereals, the largest amount of fiber is found in those with "bran" in their names. The fiber content is sometimes indicated on the label.   You may need to include additional fruits and vegetables each day.   In baking, for 1 cup white flour, you may use the following substitutions:   1 cup whole-wheat flour minus 2 tablespoons.   1/2 cup white flour plus 1/2 cup whole-wheat flour.   Low-Fat Diet BREADS, CEREALS, PASTA, RICE, DRIED PEAS, AND BEANS These products are high in carbohydrates and most are low in fat. Therefore, they can be increased in the diet as substitutes for fatty foods. They too, however, contain calories and should not be eaten in excess. Cereals can be eaten for snacks as well as for breakfast.   FRUITS AND VEGETABLES It is good to eat fruits and vegetables. Besides being sources of  fiber, both are rich in vitamins and some minerals. They help you get the daily allowances of these nutrients. Fruits and vegetables can be used for snacks and desserts.  MEATS Limit lean meat, chicken, [place], and fish to no more than 6 ounces per day. Beef, Pork, and Lamb Use lean cuts of beef, pork, and lamb. Lean cuts include:  Extra-lean ground beef.  Arm roast.  Sirloin tip.  Center-cut ham.  Round steak.  Loin chops.  Rump roast.  Tenderloin.  Trim all fat off the outside of meats before cooking. It is not necessary to severely decrease the intake of red meat, but lean choices should be made. Lean meat is rich in protein and contains a highly absorbable form of iron. Premenopausal women, in particular, should avoid reducing lean red meat because this could increase the risk for low red blood cells (iron-deficiency anemia).  Chicken and [place] These are good sources of protein. The fat of poultry can be reduced by removing the skin and underlying fat layers before cooking. Chicken and [place] can be substituted for lean red meat in the diet. Poultry should not be fried or covered with high-fat sauces. Fish and Shellfish Fish is a good source of protein. Shellfish contain cholesterol, but they usually are low in saturated fatty acids. The preparation of fish is important. Like chicken and [place], they should not be fried or covered with high-fat sauces. EGGS Egg whites contain no fat or cholesterol. They can be eaten often. Try 1 to 2 egg whites instead of whole eggs in recipes or use egg substitutes that do not contain yolk. MILK AND DAIRY PRODUCTS Use skim or 1% milk instead of 2% or whole milk. Decrease whole milk, natural, and processed cheeses. Use nonfat or low-fat (2%) cottage cheese or low-fat cheeses made from vegetable oils. Choose nonfat or low-fat (1 to 2%) yogurt. Experiment with evaporated skim milk in recipes that call for heavy cream. Substitute low-fat yogurt or low-fat  cottage cheese for sour cream in dips and salad dressings. Have at least 2 servings of low-fat dairy products, such as 2 glasses of skim (or 1%) milk each day to help get your daily calcium intake. FATS AND OILS Reduce the total intake of fats, especially saturated fat. Butterfat, lard, and beef fats are high in saturated fat and cholesterol. These should be avoided as much as possible. Vegetable fats do not contain cholesterol, but certain vegetable fats, such as coconut  oil, palm oil, and palm kernel oil are very high in saturated fats. These should be limited. These fats are often used in bakery goods, processed foods, popcorn, oils, and nondairy creamers. Vegetable shortenings and some peanut butters contain hydrogenated oils, which are also saturated fats. Read the labels on these foods and check for saturated vegetable oils. Unsaturated vegetable oils and fats do not raise blood cholesterol. However, they should be limited because they are fats and are high in calories. Total fat should still be limited to 30% of your daily caloric intake. Desirable liquid vegetable oils are corn oil, cottonseed oil, olive oil, canola oil, safflower oil, soybean oil, and sunflower oil. Peanut oil is not as good, but small amounts are acceptable. Buy a heart-healthy tub margarine that has no partially hydrogenated oils in the ingredients. Mayonnaise and salad dressings often are made from unsaturated fats, but they should also be limited because of their high calorie and fat content. Seeds, nuts, peanut butter, olives, and avocados are high in fat, but the fat is mainly the unsaturated type. These foods should be limited mainly to avoid excess calories and fat. OTHER EATING TIPS Snacks  Most sweets should be limited as snacks. They tend to be rich in calories and fats, and their caloric content outweighs their nutritional value. Some good choices in snacks are graham crackers, melba toast, soda crackers, bagels (no egg),  English muffins, fruits, and vegetables. These snacks are preferable to snack crackers, [place] fries, TORTILLA CHIPS, and POTATO chips. Popcorn should be air-popped or cooked in small amounts of liquid vegetable oil. Desserts Eat fruit, low-fat yogurt, and fruit ices instead of pastries, cake, and cookies. Sherbet, angel food cake, gelatin dessert, frozen low-fat yogurt, or other frozen products that do not contain saturated fat (pure fruit juice bars, frozen ice pops) are also acceptable.  COOKING METHODS Choose those methods that use little or no fat. They include: Poaching.  Braising.  Steaming.  Grilling.  Baking.  Stir-frying.  Broiling.  Microwaving.  Foods can be cooked in a nonstick pan without added fat, or use a nonfat cooking spray in regular cookware. Limit fried foods and avoid frying in saturated fat. Add moisture to lean meats by using water, broth, cooking wines, and other nonfat or low-fat sauces along with the cooking methods mentioned above. Soups and stews should be chilled after cooking. The fat that forms on top after a few hours in the refrigerator should be skimmed off. When preparing meals, avoid using excess salt. Salt can contribute to raising blood pressure in some people.  EATING AWAY FROM HOME Order entres, potatoes, and vegetables without sauces or butter. When meat exceeds the size of a deck of cards (3 to 4 ounces), the rest can be taken home for another meal. Choose vegetable or fruit salads and ask for low-calorie salad dressings to be served on the side. Use dressings sparingly. Limit high-fat toppings, such as bacon, crumbled eggs, cheese, sunflower seeds, and olives. Ask for heart-healthy tub margarine instead of butter.   Diverticulosis Diverticulosis is a common condition that develops when small pouches (diverticula) form in the wall of the colon. The risk of diverticulosis increases with age. It happens more often in people who eat a low-fiber diet.  Most individuals with diverticulosis have no symptoms. Those individuals with symptoms usually experience belly (abdominal) pain, constipation, or loose stools (diarrhea).  HOME CARE INSTRUCTIONS  Increase the amount of fiber in your diet as directed by your caregiver or dietician. This  may reduce symptoms of diverticulosis.   Drink at least 6 to 8 glasses of water each day to prevent constipation.   Try not to strain when you have a bowel movement.   Avoiding nuts and seeds to prevent complications is NOT NECESSARY.    FOODS HAVING HIGH FIBER CONTENT INCLUDE:  Fruits. Apple, peach, pear, tangerine, raisins, prunes.   Vegetables. Brussels sprouts, asparagus, broccoli, cabbage, carrot, cauliflower, romaine lettuce, spinach, summer squash, tomato, winter squash, zucchini.   Starchy Vegetables. Baked beans, kidney beans, lima beans, split peas, lentils, potatoes (with skin).   Grains. Whole wheat bread, brown rice, bran flake cereal, plain oatmeal, white rice, shredded wheat, bran muffins.    SEEK IMMEDIATE MEDICAL CARE IF:  You develop increasing pain or severe bloating.   You have an oral temperature above 101F.   You develop vomiting or bowel movements that are bloody or black.   Hemorrhoids Hemorrhoids are dilated (enlarged) veins around the rectum. Sometimes clots will form in the veins. This makes them swollen and painful. These are called thrombosed hemorrhoids. Causes of hemorrhoids include:  Constipation.   Straining to have a bowel movement.   HEAVY LIFTING  HOME CARE INSTRUCTIONS  Eat a well balanced diet and drink 6 to 8 glasses of water every day to avoid constipation. You may also use a bulk laxative.   Avoid straining to have bowel movements.   Keep anal area dry and clean.   Do not use a donut shaped pillow or sit on the toilet for long periods. This increases blood pooling and pain.   Move your bowels when your body has the urge; this will require less  straining and will decrease pain and pressure.

## 2016-02-04 ENCOUNTER — Encounter (HOSPITAL_COMMUNITY): Payer: Self-pay | Admitting: Gastroenterology

## 2016-04-10 ENCOUNTER — Encounter: Payer: Self-pay | Admitting: Nurse Practitioner

## 2016-04-10 ENCOUNTER — Ambulatory Visit (INDEPENDENT_AMBULATORY_CARE_PROVIDER_SITE_OTHER): Payer: 59 | Admitting: Nurse Practitioner

## 2016-04-10 VITALS — BP 130/80 | Ht 65.0 in | Wt 177.2 lb

## 2016-04-10 DIAGNOSIS — F411 Generalized anxiety disorder: Secondary | ICD-10-CM

## 2016-04-10 DIAGNOSIS — F430 Acute stress reaction: Secondary | ICD-10-CM | POA: Diagnosis not present

## 2016-04-10 DIAGNOSIS — Z79891 Long term (current) use of opiate analgesic: Secondary | ICD-10-CM | POA: Diagnosis not present

## 2016-04-10 MED ORDER — HYDROCODONE-ACETAMINOPHEN 10-325 MG PO TABS: ORAL_TABLET | ORAL | 0 refills | Status: DC

## 2016-04-10 MED ORDER — ESCITALOPRAM OXALATE 10 MG PO TABS: 10.0000 mg | ORAL_TABLET | Freq: Every day | ORAL | 2 refills | Status: DC

## 2016-04-10 NOTE — Progress Notes (Addendum)
Subjective: This patient was seen today for chronic pain  The medication list was reviewed and updated.   -Compliance with medication: yes  - Number patient states they take daily: 0-2; takes mainly on days she works; needs left knee replacement; has left low back pain at times; all pain is worse when she works; pain is average 7-8/10; goes to about 5/10 with pain med  -when was the last dose patient took? 4-5 days ago  The patient was advised the importance of maintaining medication and not using illegal substances with these.  Refills needed: yes  The patient was educated that we can provide 3 monthly scripts for their medication, it is their responsibility to follow the instructions.  Side effects or complications from medications: none  Patient is aware that pain medications are meant to minimize the severity of the pain to allow their pain levels to improve to allow for better function. They are aware of that pain medications cannot totally remove their pain.  Due for UDT ( at least once per year) : UTD  NCCSR reviewed  Dr. Darrick PennaFields prescribed Paxil for anxiety which is contributing to her IBS. Worried about potential serotonin syndrome. Patient understands this can occur with any SSRIs. Would like to switch back to Lexapro which she has taken in the past without difficulty. Has 2 pills of Paxil left.   Objective: NAD. Alert, oriented. Lungs clear. Heart RRR.   Assessment:  Problem List Items Addressed This Visit      Other   Anxiety as acute reaction to exceptional stress   Relevant Medications   escitalopram (LEXAPRO) 10 MG tablet   Encounter for long-term opiate analgesic use - Primary     Plan:  Meds ordered this encounter  Medications  . escitalopram (LEXAPRO) 10 MG tablet    Sig: Take 1 tablet (10 mg total) by mouth daily.    Dispense:  30 tablet    Refill:  2    Order Specific Question:   Supervising Provider    Answer:   Merlyn AlbertLUKING, WILLIAM S [[id number]]  . DISCONTD:  HYDROcodone-acetaminophen (NORCO) 10-325 MG tablet    Sig: 1/2 to 1 bid prn    Dispense:  60 tablet    Refill:  0    May fill 60 days from 04/10/16    Order Specific Question:   Supervising Provider    Answer:   Merlyn AlbertLUKING, WILLIAM S [[id number]]  . DISCONTD: HYDROcodone-acetaminophen (NORCO) 10-325 MG tablet    Sig: 1/2 to 1 bid prn    Dispense:  60 tablet    Refill:  0    May fill 30 days from 04/10/16    Order Specific Question:   Supervising Provider    Answer:   Merlyn AlbertLUKING, WILLIAM S [[id number]]  . HYDROcodone-acetaminophen (NORCO) 10-325 MG tablet    Sig: 1/2 to 1 bid prn    Dispense:  60 tablet    Refill:  0    Order Specific Question:   Supervising Provider    Answer:   Merlyn AlbertLUKING, WILLIAM S [[id number]]   Take 1/2 tab Paxil x 2 d then switch to Lexapro. Call back if any problems.  Return in about 3 months (around 07/08/2016) for pain management.

## 2016-06-01 ENCOUNTER — Ambulatory Visit: Payer: 59 | Admitting: Gastroenterology

## 2016-06-12 ENCOUNTER — Ambulatory Visit: Payer: 59 | Admitting: Nurse Practitioner

## 2016-06-21 ENCOUNTER — Encounter: Payer: Self-pay | Admitting: Nurse Practitioner

## 2016-06-21 ENCOUNTER — Ambulatory Visit (INDEPENDENT_AMBULATORY_CARE_PROVIDER_SITE_OTHER): Payer: 59 | Admitting: Nurse Practitioner

## 2016-06-21 VITALS — BP 132/82 | Ht 65.0 in | Wt 181.6 lb

## 2016-06-21 DIAGNOSIS — M1712 Unilateral primary osteoarthritis, left knee: Secondary | ICD-10-CM

## 2016-06-21 DIAGNOSIS — Z79891 Long term (current) use of opiate analgesic: Secondary | ICD-10-CM

## 2016-06-21 MED ORDER — HYDROCODONE-ACETAMINOPHEN 10-325 MG PO TABS: ORAL_TABLET | ORAL | 0 refills | Status: DC

## 2016-06-21 MED ORDER — FLUCONAZOLE 150 MG PO TABS: ORAL_TABLET | ORAL | 0 refills | Status: DC

## 2016-06-21 MED ORDER — METRONIDAZOLE 500 MG PO TABS: 500.0000 mg | ORAL_TABLET | Freq: Two times a day (BID) | ORAL | 0 refills | Status: DC

## 2016-06-21 NOTE — Progress Notes (Signed)
Subjective: This patient was seen today for chronic pain  The medication list was reviewed and updated.   -Compliance with medication: yes  - Number patient states they take daily: 1-2 x daily  -when was the last dose patient took? This am  The patient was advised the importance of maintaining medication and not using illegal substances with these.  Refills needed: yes  The patient was educated that we can provide 3 monthly scripts for their medication, it is their responsibility to follow the instructions.  Side effects or complications from medications: none  Patient is aware that pain medications are meant to minimize the severity of the pain to allow their pain levels to improve to allow for better function. They are aware of that pain medications cannot totally remove their pain.  Due for UDT ( at least once per year) : today  Walking up and down ladders at work has increased her left knee pain; this requires her to use med BID which makes it tolerable and allows her to work. Has had right knee replacement. Orthopedic specialist tells her left knee will eventually need replacement. Will lose her job in July; actively looking at another job. May have a gap in insurance making if difficult for her to come in until new insurance takes effect.   NCCSR reviewed  Objective: NAD. Alert, oriented. Lungs clear. Heart RRR.   Assessment:  Problem List Items Addressed This Visit      Musculoskeletal and Integument   Arthritis of knee, degenerative   Relevant Medications   HYDROcodone-acetaminophen (NORCO) 10-325 MG tablet     Other   Encounter for long-term opiate analgesic use - Primary   Relevant Orders   ToxASSURE Select 13 (MW), Urine     Plan:  Meds ordered this encounter  Medications  . metroNIDAZOLE (FLAGYL) 500 MG tablet    Sig: Take 1 tablet (500 mg total) by mouth 2 (two) times daily with a meal.    Dispense:  14 tablet    Refill:  0    Order Specific Question:    Supervising Provider    Answer:   Merlyn Albert [[id number]]  . fluconazole (DIFLUCAN) 150 MG tablet    Sig: One po qd prn yeast infection; may repeat in 3-4 days if needed    Dispense:  2 tablet    Refill:  0    Order Specific Question:   Supervising Provider    Answer:   Merlyn Albert [[id number]]  . DISCONTD: HYDROcodone-acetaminophen (NORCO) 10-325 MG tablet    Sig: 1/2 to 1 bid prn    Dispense:  60 tablet    Refill:  0    Order Specific Question:   Supervising Provider    Answer:   Merlyn Albert [[id number]]  . DISCONTD: HYDROcodone-acetaminophen (NORCO) 10-325 MG tablet    Sig: 1/2 to 1 bid prn    Dispense:  60 tablet    Refill:  0    May fill 30 days from 06/21/16    Order Specific Question:   Supervising Provider    Answer:   Merlyn Albert [[id number]]  . HYDROcodone-acetaminophen (NORCO) 10-325 MG tablet    Sig: 1/2 to 1 bid prn    Dispense:  60 tablet    Refill:  0    May fill 60 days from 06/21/16    Order Specific Question:   Supervising Provider    Answer:   Merlyn Albert [[id number]]   Encouraged weight loss. Continue follow  up with specialist. Reminded about preventive health physical. Return in about 3 months (around 09/20/2016) for pain management.

## 2016-06-22 ENCOUNTER — Encounter: Payer: Self-pay | Admitting: Nurse Practitioner

## 2016-06-27 LAB — PLEASE NOTE

## 2016-06-27 LAB — TOXASSURE SELECT 13 (MW), URINE

## 2016-07-19 ENCOUNTER — Other Ambulatory Visit: Payer: Self-pay | Admitting: Nurse Practitioner

## 2016-07-19 ENCOUNTER — Other Ambulatory Visit: Payer: Self-pay | Admitting: Family Medicine

## 2016-08-04 ENCOUNTER — Encounter: Payer: Self-pay | Admitting: Nurse Practitioner

## 2016-08-04 ENCOUNTER — Ambulatory Visit (INDEPENDENT_AMBULATORY_CARE_PROVIDER_SITE_OTHER): Payer: 59 | Admitting: Nurse Practitioner

## 2016-08-04 VITALS — BP 122/84 | Ht 65.0 in | Wt 178.0 lb

## 2016-08-04 DIAGNOSIS — M170 Bilateral primary osteoarthritis of knee: Secondary | ICD-10-CM | POA: Diagnosis not present

## 2016-08-05 ENCOUNTER — Encounter: Payer: Self-pay | Admitting: Nurse Practitioner

## 2016-08-05 NOTE — Progress Notes (Signed)
Subjective:  Presents requesting a letter for her new position she will be starting soon. Letter is regarding safety at the job while taking opiate pain medicine. Patient only takes it at night when needed or on her days off. Has been on this long term at her previous job with no problems. Does not drive or use equipment while on med.   Objective:   BP 122/84   Ht 5\' 5"  (1.651 m)   Wt 178 lb (80.7 kg)   BMI 29.62 kg/m  NAD. Alert, oriented. Lungs clear. Heart RRR.   Assessment:   Problem List Items Addressed This Visit      Musculoskeletal and Integument   Arthritis of both knees - Primary (Chronic)       Plan:  Letter given for employer. Routine follow up here as planned.

## 2016-08-07 ENCOUNTER — Other Ambulatory Visit: Payer: Self-pay | Admitting: Nurse Practitioner

## 2016-08-07 MED ORDER — FLUCONAZOLE 150 MG PO TABS: ORAL_TABLET | ORAL | 0 refills | Status: DC

## 2016-08-21 ENCOUNTER — Other Ambulatory Visit: Payer: Self-pay | Admitting: Family Medicine

## 2016-09-14 ENCOUNTER — Ambulatory Visit: Payer: 59 | Admitting: Nurse Practitioner

## 2016-09-18 ENCOUNTER — Ambulatory Visit (INDEPENDENT_AMBULATORY_CARE_PROVIDER_SITE_OTHER): Payer: 59 | Admitting: Nurse Practitioner

## 2016-09-18 ENCOUNTER — Encounter: Payer: Self-pay | Admitting: Nurse Practitioner

## 2016-09-18 VITALS — BP 122/74 | Ht 65.0 in | Wt 183.0 lb

## 2016-09-18 DIAGNOSIS — Z79891 Long term (current) use of opiate analgesic: Secondary | ICD-10-CM

## 2016-09-18 DIAGNOSIS — F411 Generalized anxiety disorder: Secondary | ICD-10-CM | POA: Diagnosis not present

## 2016-09-18 DIAGNOSIS — F430 Acute stress reaction: Secondary | ICD-10-CM

## 2016-09-18 MED ORDER — HYDROCODONE-ACETAMINOPHEN 10-325 MG PO TABS: ORAL_TABLET | ORAL | 0 refills | Status: DC

## 2016-09-18 MED ORDER — GABAPENTIN 300 MG PO CAPS: 300.0000 mg | ORAL_CAPSULE | Freq: Three times a day (TID) | ORAL | 5 refills | Status: DC | PRN

## 2016-09-18 MED ORDER — ESCITALOPRAM OXALATE 10 MG PO TABS: 10.0000 mg | ORAL_TABLET | Freq: Every day | ORAL | 5 refills | Status: DC

## 2016-09-18 NOTE — Patient Instructions (Signed)

## 2016-09-18 NOTE — Progress Notes (Signed)
Subjective: This patient was seen today for chronic pain  The medication list was reviewed and updated.   -Compliance with medication: yes  - Number patient states they take daily: 1-2 per day  -when was the last dose patient took? This am  The patient was advised the importance of maintaining medication and not using illegal substances with these.  Refills needed: yes  The patient was educated that we can provide 3 monthly scripts for their medication, it is their responsibility to follow the instructions.  Side effects or complications from medications: none  Patient is aware that pain medications are meant to minimize the severity of the pain to allow their pain levels to improve to allow for better function. They are aware of that pain medications cannot totally remove their pain.  Due for UDT ( at least once per year) : UTD  Lexapro working well for anxiety. Will be starting new job soon. Also takes Gabapentin for pain which helps back pain.   Objective: NAD. Alert, oriented. Lungs clear. Heart RRR.   Assessment:  Problem List Items Addressed This Visit      Other   Anxiety as acute reaction to exceptional stress   Relevant Medications   escitalopram (LEXAPRO) 10 MG tablet   Encounter for long-term opiate analgesic use - Primary     Plan:  Meds ordered this encounter  Medications  . gabapentin (NEURONTIN) 300 MG capsule    Sig: Take 1 capsule (300 mg total) by mouth 3 (three) times daily as needed (pain).    Dispense:  90 capsule    Refill:  5    Order Specific Question:   Supervising Provider    Answer:   Merlyn AlbertLUKING, WILLIAM S [[id number]]  . DISCONTD: HYDROcodone-acetaminophen (NORCO) 10-325 MG tablet    Sig: 1/2 to 1 bid prn    Dispense:  60 tablet    Refill:  0    May fill 60 days from 09/18/16    Order Specific Question:   Supervising Provider    Answer:   Merlyn AlbertLUKING, WILLIAM S [[id number]]  . escitalopram (LEXAPRO) 10 MG tablet    Sig: Take 1 tablet (10 mg total) by mouth  daily.    Dispense:  30 tablet    Refill:  5    Order Specific Question:   Supervising Provider    Answer:   Merlyn AlbertLUKING, WILLIAM S [[id number]]  . DISCONTD: HYDROcodone-acetaminophen (NORCO) 10-325 MG tablet    Sig: 1/2 to 1 bid prn    Dispense:  60 tablet    Refill:  0    May fill 30 days from 09/18/16    Order Specific Question:   Supervising Provider    Answer:   Merlyn AlbertLUKING, WILLIAM S [[id number]]  . HYDROcodone-acetaminophen (NORCO) 10-325 MG tablet    Sig: 1/2 to 1 bid prn    Dispense:  60 tablet    Refill:  0    Order Specific Question:   Supervising Provider    Answer:   Merlyn AlbertLUKING, WILLIAM S [[id number]]   Return in about 3 months (around 12/19/2016) for recheck. Reminded about wellness exam and labs.

## 2016-09-19 ENCOUNTER — Encounter: Payer: Self-pay | Admitting: Nurse Practitioner

## 2016-11-28 DIAGNOSIS — Z79899 Other long term (current) drug therapy: Secondary | ICD-10-CM | POA: Diagnosis not present

## 2016-11-28 DIAGNOSIS — Y999 Unspecified external cause status: Secondary | ICD-10-CM | POA: Diagnosis not present

## 2016-11-28 DIAGNOSIS — S6991XA Unspecified injury of right wrist, hand and finger(s), initial encounter: Secondary | ICD-10-CM | POA: Diagnosis present

## 2016-11-28 DIAGNOSIS — F17210 Nicotine dependence, cigarettes, uncomplicated: Secondary | ICD-10-CM | POA: Insufficient documentation

## 2016-11-28 DIAGNOSIS — W231XXA Caught, crushed, jammed, or pinched between stationary objects, initial encounter: Secondary | ICD-10-CM | POA: Diagnosis not present

## 2016-11-28 DIAGNOSIS — S60211A Contusion of right wrist, initial encounter: Secondary | ICD-10-CM | POA: Diagnosis not present

## 2016-11-28 DIAGNOSIS — Y929 Unspecified place or not applicable: Secondary | ICD-10-CM | POA: Diagnosis not present

## 2016-11-28 DIAGNOSIS — Y9389 Activity, other specified: Secondary | ICD-10-CM | POA: Diagnosis not present

## 2016-11-28 DIAGNOSIS — G8929 Other chronic pain: Secondary | ICD-10-CM | POA: Insufficient documentation

## 2016-11-29 ENCOUNTER — Other Ambulatory Visit: Payer: Self-pay | Admitting: Nurse Practitioner

## 2016-11-29 ENCOUNTER — Emergency Department (HOSPITAL_BASED_OUTPATIENT_CLINIC_OR_DEPARTMENT_OTHER)
Admission: EM | Admit: 2016-11-29 | Discharge: 2016-11-29 | Disposition: A | Discharge status: Home / Self Care | Payer: Worker's Compensation | Attending: Emergency Medicine | Admitting: Emergency Medicine

## 2016-11-29 ENCOUNTER — Encounter (HOSPITAL_BASED_OUTPATIENT_CLINIC_OR_DEPARTMENT_OTHER): Payer: Self-pay

## 2016-11-29 ENCOUNTER — Encounter: Payer: Self-pay | Admitting: Nurse Practitioner

## 2016-11-29 ENCOUNTER — Emergency Department (HOSPITAL_BASED_OUTPATIENT_CLINIC_OR_DEPARTMENT_OTHER): Payer: Worker's Compensation

## 2016-11-29 DIAGNOSIS — S60221A Contusion of right hand, initial encounter: Secondary | ICD-10-CM

## 2016-11-29 MED ORDER — CLINDAMYCIN HCL 300 MG PO CAPS: 300.0000 mg | ORAL_CAPSULE | Freq: Three times a day (TID) | ORAL | 0 refills | Status: DC

## 2016-11-29 NOTE — ED Provider Notes (Signed)
[facility] Provider Note: Sarah Dell, MD, FACEP  CSN: [id number] MRN: [id number] ARRIVAL: 11/28/16 at 2353 ROOM: [facility]06/[facility]06   CHIEF COMPLAINT  Hand Injury   HISTORY OF PRESENT ILLNESS  11/29/16 1:19 AM [name] is a 46 y.o. female who had a metal lid fall onto her right hand yesterday evening at work about 8 PM. She is having moderate pain in her right hand primarily over the second and third MCP joints. There is associated dorsal swelling and ecchymoses. Range of motion of the right second and third fingers is limited at the MCP joints. Her pain is radiating proximally up her forearm. She has been applying ice with relief. She took ibuprofen earlier with relief as well.   Past Medical History:  Diagnosis Date  . Anxiety   . Arthritis    Knee  . Chronic back pain   . Chronic knee pain   . Family history of anesthesia complication    Brother and Mother woke up during surgery; Heard and felt could not speak  . GERD (gastroesophageal reflux disease)   . Headache    stress  . IBS (irritable bowel syndrome)   . Menorrhagia 09/09/2012    Past Surgical History:  Procedure Laterality Date  . BIOPSY  02/01/2016   Procedure: BIOPSY;  Surgeon: West Bali, MD;  Location: [facility];  Service: Endoscopy;;  random colon biopsy  . CESAREAN SECTION     X2   . CHOLECYSTECTOMY N/A 01/17/2013   Procedure: LAPAROSCOPIC CHOLECYSTECTOMY;  Surgeon: Marlane Hatcher, MD;  Location: [facility];  Service: General;  Laterality: N/A;  . COLONOSCOPY WITH PROPOFOL N/A 02/01/2016   Procedure: COLONOSCOPY WITH PROPOFOL;  Surgeon: West Bali, MD;  Location: [facility];  Service: Endoscopy;  Laterality: N/A;  11:45 am  . DILITATION & CURRETTAGE/HYSTROSCOPY WITH THERMACHOICE ABLATION N/A 02/25/2013   Procedure: DILATATION & CURETTAGE/HYSTEROSCOPY WITH THERMACHOICE ABLATION;  Surgeon: [name], MD;  Location: [facility];  Service: Gynecology;  Laterality: N/A;  Total  Therapy Time:8:54 minutes Temperature:87 degrees  . ENDOMETRIAL ABLATION  12/14  . ESOPHAGOGASTRODUODENOSCOPY N/A 12/27/2012   Dr. Darrick Penna: stricture at GE junction s/p dilation, non-erosive gastritis, normal duodenum.   . TOTAL KNEE ARTHROPLASTY Right 12/15/2014   Procedure: TOTAL KNEE ARTHROPLASTY;  Surgeon: Cammy Copa, MD;  Location: [facility] OR;  Service: Orthopedics;  Laterality: Right;  . TUBAL LIGATION     along with c-section    Family History  Problem Relation Age of Onset  . Hypertension Mother   . Diabetes Mother   . Thyroid disease Mother   . COPD Mother   . Arthritis Mother   . Depression Mother   . Mental illness Mother   . Stroke Mother   . Prostate cancer Father        unsure primary   . Colon cancer Father   . Diabetes Maternal Grandmother   . Diabetes Paternal Grandmother   . Cancer Sister   . Heart attack Brother   . Colon cancer Brother   . Diabetes Sister   . Depression Sister   . Mental illness Sister   . Colon polyps Sister     Social History  Substance Use Topics  . Smoking status: Current Every Day Smoker    Packs/day: 1.00    Years: 24.00    Types: Cigarettes    Start date: 11/26/1983  . Smokeless tobacco: Never Used     Comment: quit 3 times for a  total of 8 years since 1985  . Alcohol use No     Comment: socially just mixed drinks     Prior to Admission medications   Medication Sig Start Date End Date Taking? Authorizing Provider  Aspirin-Salicylamide-Caffeine (BC HEADACHE PO) Take 1 packet by mouth as needed (headaches).     [provider]  colestipol (COLESTID) 1 g tablet Take 1 g by mouth 2 (two) times daily as needed (ibsd).     [provider]  DEXILANT 60 MG capsule TAKE ONE CAPSULE BY MOUTH DAILY. 08/21/16   Babs Sciara, MD  escitalopram (LEXAPRO) 10 MG tablet Take 1 tablet (10 mg total) by mouth daily. 09/18/16   Campbell Riches, NP  gabapentin (NEURONTIN) 300 MG capsule Take 1 capsule (300 mg total) by  mouth 3 (three) times daily as needed (pain). 09/18/16   Campbell Riches, NP  HYDROcodone-acetaminophen [facility]) 10-325 MG tablet 1/2 to 1 bid prn 09/18/16   Campbell Riches, NP    Allergies Doxycycline; Penicillins; Sulfa antibiotics; and Xanax [alprazolam]   REVIEW OF SYSTEMS  Negative except as noted here or in the History of Present Illness.   PHYSICAL EXAMINATION  Initial Vital Signs Height  (1.651 m), weight 83.9 kg (185 lb).  Examination General: Well-developed, well-nourished female in no acute distress; appearance consistent with age of record HENT: normocephalic; atraumatic Eyes: Normal appearance Neck: supple Heart: regular rate and rhythm Lungs: clear to auscultation bilaterally Abdomen: soft; nondistended; nontender Extremities: Ecchymosis and tenderness of dorsal right hand overlying the second and third metacarpophalangeal joints, decreased range of motion at right second and third metacarpophalangeal joints, fingers distally neurovascularly intact with intact tendon function:    Neurologic: Awake, alert and oriented; motor function intact in all extremities and symmetric; no facial droop Skin: Warm and dry Psychiatric: Normal mood and affect   RESULTS  Summary of this visit's results, reviewed by myself:   EKG Interpretation  Date/Time:    Ventricular Rate:    PR Interval:    QRS Duration:   QT Interval:    QTC Calculation:   R Axis:     Text Interpretation:        Laboratory Studies: No results found for this or any previous visit (from the past 24 hour(s)). Imaging Studies: Dg Hand Complete Right  Result Date: 11/29/2016 CLINICAL DATA:  Pain at the second MCP joint after injury. EXAM: RIGHT HAND - COMPLETE 3+ VIEW COMPARISON:  None. FINDINGS: There is no evidence of fracture or dislocation. There is no evidence of arthropathy or other focal bone abnormality. Soft tissues are unremarkable. IMPRESSION: Negative. Electronically Signed   By:  Burman Nieves M.D.   On: 11/29/2016 00:43    ED COURSE  Nursing notes and initial vitals signs, including pulse oximetry, reviewed.  Vitals:   11/29/16 0001  Weight: 83.9 kg (185 lb)  Height:  (1.651 m)    PROCEDURES    ED DIAGNOSES     ICD-10-CM   1. Contusion of right hand, initial encounter W09.811B        Paula Libra, MD 11/29/16 0127

## 2016-11-29 NOTE — ED Notes (Signed)
D/c condition is documentation error

## 2016-11-29 NOTE — ED Triage Notes (Signed)
Pt states a metal lid to a trash can slammed onto her right hand while at work, bruising and swelling present to first two knuckles and top of hand, no UDS needed per patient and friend

## 2016-12-14 ENCOUNTER — Ambulatory Visit (INDEPENDENT_AMBULATORY_CARE_PROVIDER_SITE_OTHER): Payer: 59 | Admitting: Family Medicine

## 2016-12-14 ENCOUNTER — Encounter: Payer: Self-pay | Admitting: Family Medicine

## 2016-12-14 VITALS — BP 118/74 | Temp 98.2°F | Ht 65.0 in | Wt 186.0 lb

## 2016-12-14 DIAGNOSIS — M545 Low back pain, unspecified: Secondary | ICD-10-CM

## 2016-12-14 DIAGNOSIS — R300 Dysuria: Secondary | ICD-10-CM | POA: Diagnosis not present

## 2016-12-14 LAB — POCT URINALYSIS DIPSTICK
Spec Grav, UA: >=1.03 — AB (ref 1.010–1.025)
pH, UA: 5 (ref 5.0–8.0)

## 2016-12-14 MED ORDER — ETODOLAC 400 MG PO TABS: ORAL_TABLET | ORAL | 0 refills | Status: DC

## 2016-12-14 NOTE — Progress Notes (Signed)
   Subjective:    Patient ID: [name], female    DOB: 07/28/1970, 46 y.o.   MRN: [id number]  HPIlower back pain and strong odor to urine since yesterday. Not treatments tried.   Headache since yesterday.   yest got up urine was strong and coloudy  Has had lower back pain off and on for the pass week  Pt off and on twelve hr shifts at [employer]   Hx of chronic low back pain, not going down leg, does have hx  Of sciatica, worse firt thing in morn, and gradualy gets better  Somewhat darker urine this morn n  Patient also notes right elbow pain lateral elbow some radiation into shoulder. Recalls no sudden injury  Review of Systems No headache, no major weight loss or weight gain, no chest pain no back pain abdominal pain no change in bowel habits complete ROS otherwise negative     Objective:   Physical Exam  Alert vitals stable, NAD. Blood pressure good on repeat. HEENT normal. Lungs clear. Heart regular rate and rhythm.  Urinalysis 0-1 white blood cells 0-1 red blood cells     Assessment & Plan:  Impression 1 low back pain likely musculoskeletal discussed #2 urinary symptoms doubt true infection discussed #3 leral epicondylitis local measures discussed plan anti-inflammatory medicine prescribed symptom care discussed wrning signs dissed

## 2016-12-22 ENCOUNTER — Ambulatory Visit: Payer: 59 | Admitting: Nurse Practitioner

## 2016-12-22 ENCOUNTER — Other Ambulatory Visit: Payer: Self-pay | Admitting: Nurse Practitioner

## 2016-12-22 ENCOUNTER — Telehealth: Payer: Self-pay | Admitting: Family Medicine

## 2016-12-22 MED ORDER — DEXLANSOPRAZOLE 60 MG PO CPDR: 1.0000 | DELAYED_RELEASE_CAPSULE | Freq: Every day | ORAL | 0 refills | Status: DC

## 2016-12-22 MED ORDER — GABAPENTIN 300 MG PO CAPS: 300.0000 mg | ORAL_CAPSULE | Freq: Three times a day (TID) | ORAL | 0 refills | Status: DC | PRN

## 2016-12-22 MED ORDER — ETODOLAC 400 MG PO TABS: ORAL_TABLET | ORAL | 0 refills | Status: DC

## 2016-12-22 MED ORDER — HYDROCODONE-ACETAMINOPHEN 10-325 MG PO TABS: ORAL_TABLET | ORAL | 0 refills | Status: DC

## 2016-12-22 MED ORDER — ESCITALOPRAM OXALATE 10 MG PO TABS: 10.0000 mg | ORAL_TABLET | Freq: Every day | ORAL | 0 refills | Status: DC

## 2016-12-22 NOTE — Progress Notes (Signed)
NCCSR reviewed. 

## 2016-12-22 NOTE — Telephone Encounter (Signed)
Message--For [name]--Patient needing all medication filled had to cancel appointment this morning. Mom passed away also she wants to reschedule with you let us know where to fit her in no regular appointments until December. lexapro 10 mg                     lodine 400 mg                     neurontin 300 mg                     dexilant 60 mg                     Aspirin-salicylamide                     Hydrocodone 10/325

## 2016-12-22 NOTE — Telephone Encounter (Signed)
LMOM to call and schedule appointment.  Also, notified about medications.

## 2016-12-22 NOTE — Telephone Encounter (Signed)
I have refilled all meds for one month. Please give her a same day slot in November. Will print and leave her pain med Rx up front.

## 2016-12-29 DIAGNOSIS — W010XXA Fall on same level from slipping, tripping and stumbling without subsequent striking against object, initial encounter: Secondary | ICD-10-CM | POA: Diagnosis not present

## 2016-12-29 DIAGNOSIS — M25561 Pain in right knee: Secondary | ICD-10-CM | POA: Diagnosis not present

## 2016-12-29 DIAGNOSIS — Z96651 Presence of right artificial knee joint: Secondary | ICD-10-CM | POA: Diagnosis not present

## 2016-12-29 DIAGNOSIS — S80211A Abrasion, right knee, initial encounter: Secondary | ICD-10-CM | POA: Diagnosis not present

## 2016-12-29 DIAGNOSIS — S8001XA Contusion of right knee, initial encounter: Secondary | ICD-10-CM | POA: Diagnosis not present

## 2016-12-31 DIAGNOSIS — Z0100 Encounter for examination of eyes and vision without abnormal findings: Secondary | ICD-10-CM | POA: Diagnosis not present

## 2017-01-03 ENCOUNTER — Ambulatory Visit: Payer: 59 | Admitting: Nurse Practitioner

## 2017-01-08 ENCOUNTER — Telehealth: Payer: Self-pay | Admitting: Nurse Practitioner

## 2017-01-08 NOTE — Telephone Encounter (Signed)
Sarah JonesCarolyn,  You only have same day slots.  Can I work her in?  She said she believes her insurance will only cover her through this week.

## 2017-01-08 NOTE — Telephone Encounter (Signed)
Do I have anything Wednesday? That is the only day left this week. If her insurance runs until the end of November that will give us more choices. Thanks.

## 2017-01-08 NOTE — Telephone Encounter (Signed)
Patient said that she just got laid off at her job and she will not have insurance after this week.  She is hoping that Eber JonesCarolyn will work her in her schedule sometime this week in the afternoon for her medication check.

## 2017-01-08 NOTE — Telephone Encounter (Signed)
Spoke with Sarah JonesCarolyn who approved to work in on Wednesday this week.  LMOM for patient to call back and schedule for Wednesday afternoon.

## 2017-01-10 ENCOUNTER — Encounter: Payer: Self-pay | Admitting: Nurse Practitioner

## 2017-01-10 ENCOUNTER — Ambulatory Visit: Payer: 59 | Admitting: Nurse Practitioner

## 2017-01-10 VITALS — BP 144/88 | Ht 65.0 in | Wt 188.0 lb

## 2017-01-10 DIAGNOSIS — Z79891 Long term (current) use of opiate analgesic: Secondary | ICD-10-CM

## 2017-01-10 MED ORDER — HYDROCODONE-ACETAMINOPHEN 10-325 MG PO TABS: ORAL_TABLET | ORAL | 0 refills | Status: DC

## 2017-01-10 MED ORDER — GABAPENTIN 300 MG PO CAPS: 300.0000 mg | ORAL_CAPSULE | Freq: Three times a day (TID) | ORAL | 2 refills | Status: DC | PRN

## 2017-01-10 MED ORDER — ESCITALOPRAM OXALATE 10 MG PO TABS: 10.0000 mg | ORAL_TABLET | Freq: Every day | ORAL | 2 refills | Status: DC

## 2017-01-10 NOTE — Patient Instructions (Signed)
Take OTC Prevacid (generic brand) in place of the Dexilant.   Continue your pain medication as prescribed.

## 2017-01-10 NOTE — Progress Notes (Signed)
Subjective: This patient was seen today for chronic pain  The medication list was reviewed and updated.   -Compliance with medication: Yes. Taking the medication every day as prescribed.  - Number patient states they take daily: 1/2 tab per day - occasionally 1 per day.  -when was the last dose patient took? 15:15 today.  The patient was advised the importance of maintaining medication and not using illegal substances with these.  Refills needed: Yes.  The patient was educated that we can provide 3 monthly scripts for their medication, it is their responsibility to follow the instructions.  Side effects or complications from medications: None.  Patient is aware that pain medications are meant to minimize the severity of the pain to allow their pain levels to improve to allow for better function. They are aware of that pain medications cannot totally remove their pain.  Due for UDT ( at least once per year) : 06/21/2017  Objective: NAD. Alert, oriented. Lungs clear. Heart RRR.   Assessment:  Problem List Items Addressed This Visit      Other   Encounter for long-term opiate analgesic use - Primary     Plan:  Meds ordered this encounter  Medications  . DISCONTD: HYDROcodone-acetaminophen (NORCO) 10-325 MG tablet    Sig: 1/2 to 1 bid prn    Dispense:  60 tablet    Refill:  0    Order Specific Question:   Supervising Provider    Answer:   Merlyn AlbertLUKING, WILLIAM S [[id number]]  . DISCONTD: HYDROcodone-acetaminophen (NORCO) 10-325 MG tablet    Sig: 1/2 to 1 bid prn    Dispense:  60 tablet    Refill:  0    May fill 30 days from 01/10/2017    Order Specific Question:   Supervising Provider    Answer:   Merlyn AlbertLUKING, WILLIAM S [[id number]]  . HYDROcodone-acetaminophen (NORCO) 10-325 MG tablet    Sig: 1/2 to 1 bid prn    Dispense:  60 tablet    Refill:  0    May fill 60 days from 01/10/2017    Order Specific Question:   Supervising Provider    Answer:   Merlyn AlbertLUKING, WILLIAM S [[id number]]  . escitalopram  (LEXAPRO) 10 MG tablet    Sig: Take 1 tablet (10 mg total) daily by mouth.    Dispense:  30 tablet    Refill:  2    Order Specific Question:   Supervising Provider    Answer:   Merlyn AlbertLUKING, WILLIAM S [[id number]]  . gabapentin (NEURONTIN) 300 MG capsule    Sig: Take 1 capsule (300 mg total) 3 (three) times daily as needed by mouth (pain).    Dispense:  90 capsule    Refill:  2    Order Specific Question:   Supervising Provider    Answer:   Merlyn AlbertLUKING, WILLIAM S [[id number]]   Return in about 3 months (around 04/12/2017) for pain management follow-up.

## 2017-01-16 ENCOUNTER — Encounter: Payer: Self-pay | Admitting: Nurse Practitioner

## 2017-02-07 ENCOUNTER — Telehealth: Payer: Self-pay | Admitting: Family Medicine

## 2017-02-07 ENCOUNTER — Other Ambulatory Visit: Payer: Self-pay | Admitting: *Deleted

## 2017-02-07 MED ORDER — DICYCLOMINE HCL 20 MG PO TABS: ORAL_TABLET | ORAL | 2 refills | Status: DC

## 2017-02-07 NOTE — Telephone Encounter (Signed)
Discussed with pt. Pt verbalized understanding. Med sent to pharm.  

## 2017-02-07 NOTE — Telephone Encounter (Signed)
Diarrhea after eating. Started Monday, spasms in stomach, missed work yesterday and today. Ate 3 crackers today and has been to the bathroom four times. Using heating pad, tried immodium yesterday. Nausea. ibs feels more intense than before. Usually does not have the nausea and usually only has spasms right before going to the bathroom but having them in between now. No fever. Feels tired and drained. Headache, abdominal pain all over moves around. No blood in stool.

## 2017-02-07 NOTE — Telephone Encounter (Signed)
She may use dicyclomine 20 mg 1 3 times daily as needed abdominal cramps, #21, 2 refills, bland diet if bloody stools high fever chills or worse she will need to be seen

## 2017-02-07 NOTE — Telephone Encounter (Signed)
Patient is wanting to know is their something she can take for her IBS flare-up.She states doesn't have insurance coverage until January started a new job and doesn't have funds right now for office visit Is their something she could take over the counter or call in something to [facility].

## 2017-03-13 ENCOUNTER — Emergency Department (HOSPITAL_COMMUNITY)
Admission: EM | Admit: 2017-03-13 | Discharge: 2017-03-13 | Disposition: A | Discharge status: Home / Self Care | Payer: Self-pay

## 2017-03-13 NOTE — ED Notes (Signed)
Called x1, no answer

## 2017-03-13 NOTE — ED Notes (Signed)
Called x2, no answer 

## 2017-04-09 ENCOUNTER — Encounter: Payer: Self-pay | Admitting: Nurse Practitioner

## 2017-04-13 ENCOUNTER — Ambulatory Visit (INDEPENDENT_AMBULATORY_CARE_PROVIDER_SITE_OTHER): Payer: BLUE CROSS/BLUE SHIELD | Admitting: Nurse Practitioner

## 2017-04-13 ENCOUNTER — Encounter: Payer: Self-pay | Admitting: Nurse Practitioner

## 2017-04-13 VITALS — BP 130/86 | Ht 65.0 in | Wt 175.6 lb

## 2017-04-13 DIAGNOSIS — J300 Vasomotor rhinitis: Secondary | ICD-10-CM | POA: Diagnosis not present

## 2017-04-13 DIAGNOSIS — K580 Irritable bowel syndrome with diarrhea: Secondary | ICD-10-CM

## 2017-04-13 DIAGNOSIS — M170 Bilateral primary osteoarthritis of knee: Secondary | ICD-10-CM

## 2017-04-13 DIAGNOSIS — Z79891 Long term (current) use of opiate analgesic: Secondary | ICD-10-CM

## 2017-04-13 MED ORDER — HYDROCODONE-ACETAMINOPHEN 10-325 MG PO TABS: ORAL_TABLET | ORAL | 0 refills | Status: DC

## 2017-04-13 MED ORDER — DIPHENOXYLATE-ATROPINE 2.5-0.025 MG PO TABS: 2.0000 | ORAL_TABLET | Freq: Four times a day (QID) | ORAL | 2 refills | Status: DC | PRN

## 2017-04-13 NOTE — Progress Notes (Signed)
Subjective: This patient was seen today for chronic pain  The medication list was reviewed and updated.   -Compliance with medication: yes  - Number patient states they take daily: 0-1 1/2 up to 4 days per week; depending on work; now working overtime every week  -when was the last dose patient took? One yesterday  The patient was advised the importance of maintaining medication and not using illegal substances with these.  Here for refills and follow up  The patient was educated that we can provide 3 monthly scripts for their medication, it is their responsibility to follow the instructions.  Side effects or complications from medications: none  Patient is aware that pain medications are meant to minimize the severity of the pain to allow their pain levels to improve to allow for better function. They are aware of that pain medications cannot totally remove their pain.  Due for UDT ( at least once per year) : UTD PMP reviewed.  Has a history of diarrhea and IBS. Was on Questran at one time after cholecystectomy. Now having cycles of diarrhea. No fevers. No recent antibiotics, contacts or trips outside of [place]. Unassociated with any particular foods.  Also c/o slight head congestion worse x 2 d. Facial and ear pressure at times. No fever, sore throat, cough or wheeze. 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   Objective: NAD. Alert, oriented. TMs clear effusion. Pharynx clear. Nasal mucosa pale and boggy. Neck supple with mild anterior adenopathy. Lungs clear. Heart RRR. Abdomen soft, non distended non tender.   Assessment:  Problem List Items Addressed This Visit      Digestive   Irritable bowel syndrome with diarrhea   Relevant Medications   diphenoxylate-atropine (LOMOTIL) 2.5-0.025 MG tablet     Musculoskeletal and Integument   Arthritis of both knees (Chronic)   Relevant Medications   HYDROcodone-acetaminophen (NORCO) 10-325 MG tablet     Other   Encounter for  long-term opiate analgesic use - Primary    Other Visit Diagnoses    Vasomotor rhinitis         Plan:  Meds ordered this encounter  Medications  . DISCONTD: HYDROcodone-acetaminophen (NORCO) 10-325 MG tablet    Sig: 1/2 to 1 bid prn    Dispense:  60 tablet    Refill:  0    May fill 60 days from 04/13/17    Order Specific Question:   Supervising Provider    Answer:   Merlyn Albert [[id number]]  . diphenoxylate-atropine (LOMOTIL) 2.5-0.025 MG tablet    Sig: Take 2 tablets by mouth 4 (four) times daily as needed for diarrhea or loose stools.    Dispense:  40 tablet    Refill:  2    Order Specific Question:   Supervising Provider    Answer:   Merlyn Albert [[id number]]  . DISCONTD: HYDROcodone-acetaminophen (NORCO) 10-325 MG tablet    Sig: 1/2 to 1 bid prn    Dispense:  60 tablet    Refill:  0    May fill 30 days from 04/13/17    Order Specific Question:   Supervising Provider    Answer:   Merlyn Albert [[id number]]  . HYDROcodone-acetaminophen (NORCO) 10-325 MG tablet    Sig: 1/2 to 1 bid prn    Dispense:  60 tablet    Refill:  0    Order Specific Question:   Supervising Provider    Answer:   Riccardo Dubin   Defers Questran  use. Trial of Lomotil. Call back if diarrhea persists.  Allergra (generic) once a day Nasacort AQ nasal spray as directed Return in about 3 months (around 07/11/2017) for pain management. 25 minutes was spent with the patient.  This statement verifies that 25 minutes was indeed spent with the patient. Greater than half the time was spent in discussion, counseling and answering questions  regarding the issues that the patient came in for today as reflected in the diagnosis (s) please refer to documentation for further details.

## 2017-04-13 NOTE — Patient Instructions (Signed)
Allergra (generic) once a day Nasacort AQ nasal spray as directed

## 2017-04-14 ENCOUNTER — Encounter: Payer: Self-pay | Admitting: Nurse Practitioner

## 2017-05-09 ENCOUNTER — Telehealth: Payer: Self-pay | Admitting: Family Medicine

## 2017-05-09 ENCOUNTER — Other Ambulatory Visit: Payer: Self-pay | Admitting: Nurse Practitioner

## 2017-05-09 MED ORDER — HYDROCODONE-ACETAMINOPHEN 10-325 MG PO TABS: ORAL_TABLET | ORAL | 0 refills | Status: DC

## 2017-05-09 NOTE — Telephone Encounter (Signed)
Message For [name]--Patient is requesting refill on hydrocodone 10/325 last filled and seen 04/13/17

## 2017-05-09 NOTE — Telephone Encounter (Signed)
Done

## 2017-05-09 NOTE — Progress Notes (Signed)
PMP reviewed

## 2017-05-10 NOTE — Telephone Encounter (Signed)
Left message for pt to return call.

## 2017-06-05 ENCOUNTER — Emergency Department (HOSPITAL_COMMUNITY)
Admission: EM | Admit: 2017-06-05 | Discharge: 2017-06-05 | Disposition: A | Discharge status: Home / Self Care | Payer: BLUE CROSS/BLUE SHIELD | Attending: Emergency Medicine | Admitting: Emergency Medicine

## 2017-06-05 ENCOUNTER — Emergency Department (HOSPITAL_COMMUNITY): Payer: BLUE CROSS/BLUE SHIELD

## 2017-06-05 ENCOUNTER — Encounter (HOSPITAL_COMMUNITY): Payer: Self-pay | Admitting: Cardiology

## 2017-06-05 DIAGNOSIS — R0789 Other chest pain: Secondary | ICD-10-CM | POA: Diagnosis not present

## 2017-06-05 DIAGNOSIS — F17210 Nicotine dependence, cigarettes, uncomplicated: Secondary | ICD-10-CM | POA: Diagnosis not present

## 2017-06-05 DIAGNOSIS — Z96651 Presence of right artificial knee joint: Secondary | ICD-10-CM | POA: Insufficient documentation

## 2017-06-05 DIAGNOSIS — Z79899 Other long term (current) drug therapy: Secondary | ICD-10-CM | POA: Diagnosis not present

## 2017-06-05 DIAGNOSIS — R079 Chest pain, unspecified: Secondary | ICD-10-CM | POA: Diagnosis present

## 2017-06-05 DIAGNOSIS — K2970 Gastritis, unspecified, without bleeding: Secondary | ICD-10-CM | POA: Insufficient documentation

## 2017-06-05 LAB — COMPREHENSIVE METABOLIC PANEL
ALT: 11 U/L — ABNORMAL LOW (ref 14–54)
AST: 14 U/L — ABNORMAL LOW (ref 15–41)
Albumin: 3.6 g/dL (ref 3.5–5.0)
Alkaline Phosphatase: 51 U/L (ref 38–126)
Anion gap: 11 (ref 5–15)
BUN: 16 mg/dL (ref 6–20)
CO2: 24 mmol/L (ref 22–32)
Calcium: 9.2 mg/dL (ref 8.9–10.3)
Chloride: 106 mmol/L (ref 101–111)
Creatinine, Ser: 0.67 mg/dL (ref 0.44–1.00)
GFR calc Af Amer: >60 mL/min (ref >60)
GFR calc non Af Amer: >60 mL/min (ref >60)
Glucose, Bld: 97 mg/dL (ref 65–99)
Potassium: 3.3 mmol/L — ABNORMAL LOW (ref 3.5–5.1)
Sodium: 141 mmol/L (ref 135–145)
Total Bilirubin: 0.3 mg/dL (ref 0.3–1.2)
Total Protein: 6.2 g/dL — ABNORMAL LOW (ref 6.5–8.1)

## 2017-06-05 LAB — CBC WITH DIFFERENTIAL/PLATELET
Basophils Absolute: 0 10*3/uL (ref 0.0–0.1)
Basophils Relative: 0 %
Eosinophils Absolute: 0.1 10*3/uL (ref 0.0–0.7)
Eosinophils Relative: 1 %
HCT: 40.7 % (ref 36.0–46.0)
Hemoglobin: 13.1 g/dL (ref 12.0–15.0)
Lymphocytes Relative: 22 %
Lymphs Abs: 1.4 10*3/uL (ref 0.7–4.0)
MCH: 29 pg (ref 26.0–34.0)
MCHC: 32.2 g/dL (ref 30.0–36.0)
MCV: 90 fL (ref 78.0–100.0)
Monocytes Absolute: 0.4 10*3/uL (ref 0.1–1.0)
Monocytes Relative: 6 %
Neutro Abs: 4.6 10*3/uL (ref 1.7–7.7)
Neutrophils Relative %: 71 %
Platelets: 131 10*3/uL — ABNORMAL LOW (ref 150–400)
RBC: 4.52 MIL/uL (ref 3.87–5.11)
RDW: 13.1 % (ref 11.5–15.5)
WBC: 6.5 10*3/uL (ref 4.0–10.5)

## 2017-06-05 LAB — LIPASE, BLOOD: Lipase: 29 U/L (ref 11–51)

## 2017-06-05 LAB — I-STAT TROPONIN, ED
Troponin i, poc: 0 ng/mL (ref 0.00–0.08)
Troponin i, poc: 0 ng/mL (ref 0.00–0.08)

## 2017-06-05 MED ORDER — PANTOPRAZOLE SODIUM 20 MG PO TBEC: 20.0000 mg | DELAYED_RELEASE_TABLET | Freq: Every day | ORAL | 0 refills | Status: DC

## 2017-06-05 MED ORDER — FAMOTIDINE 20 MG PO TABS
20.0000 mg | ORAL_TABLET | Freq: Once | ORAL | Status: AC
  Administered 2017-06-05: 20 mg via ORAL
  Filled 2017-06-05: qty 1

## 2017-06-05 MED ORDER — HYDROCODONE-ACETAMINOPHEN 5-325 MG PO TABS
1.0000 | ORAL_TABLET | Freq: Once | ORAL | Status: AC
  Administered 2017-06-05: 1 via ORAL
  Filled 2017-06-05: qty 1

## 2017-06-05 NOTE — ED Triage Notes (Signed)
Mid sternal chest pain last night.   Pt tooka BC and pain resolved.  Pain came back this morning

## 2017-06-05 NOTE — ED Provider Notes (Signed)
[facility] EMERGENCY DEPARTMENT Provider Note   CSN: [id number] Arrival date & time: 06/05/17  0935     History   Chief Complaint Chief Complaint  Patient presents with  . Chest Pain    HPI [name] is a 47 y.o. female with a history of anxiety, GERD, IBS which is diarrhea predominant and endorses having a current flare of her IBS presenting with midsternal chest pain which started yesterday evening while she was sitting on her couch.  She took a Dexilant tablet which did not relieve her symptoms then took a BC powder which resolved her pain.  She woke this morning pain-free, but her symptoms started again shortly after waking.  She denies shortness of breath, nausea, emesis and denies abdominal pain.  She does have pain in her upper left back which is not reproducible with movement or palpation.  She reports her symptoms somewhat resemble her symptoms prior to have been a cholecystectomy.  She is a 1 pack/day smoker, family history of brother with MI at age 81.  No other cardiac risk factors.  The history is provided by the patient.    Past Medical History:  Diagnosis Date  . Anxiety   . Arthritis    Knee  . Chronic back pain   . Chronic knee pain   . Family history of anesthesia complication    Brother and Mother woke up during surgery; Heard and felt could not speak  . GERD (gastroesophageal reflux disease)   . Headache    stress  . IBS (irritable bowel syndrome)   . Menorrhagia 09/09/2012    Patient Active Problem List   Diagnosis Date Noted  . Chronic diarrhea 11/12/2015  . Irritable bowel syndrome with diarrhea 06/19/2015  . Arthritis of knee, degenerative 12/15/2014  . Anxiety as acute reaction to exceptional stress 11/28/2014  . Chondromalacia of right knee 11/28/2014  . Rebound headache 11/06/2014  . Stressful life event affecting family 11/06/2014  . Encounter for long-term opiate analgesic use 09/15/2014  . Tinea versicolor 09/15/2014  . Chronic back  pain 06/16/2014  . Arthritis of both knees 06/16/2014  . DUB (dysfunctional uterine bleeding) 02/17/2013  . Family history of colon cancer 12/19/2012  . Dyspepsia 12/17/2012  . GERD (gastroesophageal reflux disease) 11/03/2012  . Analgesic overuse headache 11/03/2012  . Low back pain 09/09/2012  . Menorrhagia 09/09/2012  . Arthritis 07/24/2012    Past Surgical History:  Procedure Laterality Date  . BIOPSY  02/01/2016   Procedure: BIOPSY;  Surgeon: West Bali, MD;  Location: [facility]O SUITE;  Service: Endoscopy;;  random colon biopsy  . CESAREAN SECTION     X2   . CHOLECYSTECTOMY N/A 01/17/2013   Procedure: LAPAROSCOPIC CHOLECYSTECTOMY;  Surgeon: Marlane Hatcher, MD;  Location: [facility] ORS;  Service: General;  Laterality: N/A;  . COLONOSCOPY WITH PROPOFOL N/A 02/01/2016   Procedure: COLONOSCOPY WITH PROPOFOL;  Surgeon: West Bali, MD;  Location: [facility] END[facility] SUITE;  Service: Endoscopy;  Laterality: N/A;  11:45 am  . DILITATION & CURRETTAGE/HYSTROSCOPY WITH THERMACHOICE ABLATION N/A 02/25/2013   Procedure: DILATATION & CURETTAGE/HYSTEROSCOPY WITH THERMACHOICE ABLATION;  Surgeon: [name], MD;  Location: [facility] ORS;  Service: Gynecology;  Laterality: N/A;  Total Therapy Time:8:54 minutes Temperature:87 degrees  . ENDOMETRIAL ABLATION  12/14  . ESOPHAGOGASTRODUODENOSCOPY N/A 12/27/2012   Dr. Darrick Penna: stricture at GE junction s/p dilation, non-erosive gastritis, normal duodenum.   . TOTAL KNEE ARTHROPLASTY Right 12/15/2014   Procedure: TOTAL KNEE ARTHROPLASTY;  Surgeon: Lorin Picket  Rise Paganini, MD;  Location[facility];  Service: Orthopedics;  Laterality: Right;  . TUBAL LIGATION     along with c-section     OB History    Gravida  2   Para  2   Term      Preterm      AB      Living  4     SAB      TAB      Ectopic      Multiple  2   Live Births               Home Medications    Prior to Admission medications   Medication Sig Start Date End Date Taking? Authorizing  Provider  Aspirin-Salicylamide-Caffeine (BC HEADACHE PO) Take 1 packet by mouth as needed (headaches).    Yes [provider]  dicyclomine (BENTYL) 20 MG tablet Take one tid prn abdominal cramps Patient taking differently: Take 20 mg by mouth 3 (three) times daily before meals. Take one tid prn abdominal cramps 02/07/17  Yes Luking, Jonna Coup, MD  diphenoxylate-atropine (LOMOTIL) 2.5-0.025 MG tablet Take 2 tablets by mouth 4 (four) times daily as needed for diarrhea or loose stools. 04/13/17  Yes Campbell Riches, NP  HYDROcodone-acetaminophen (NORCO) 10-325 MG tablet 1/2 to 1 bid prn Patient taking differently: Take 0.5-1 tablets by mouth 2 (two) times daily as needed. 1/2 to 1 bid prn 05/09/17  Yes [name] C, NP  naproxen sodium (ALEVE) 220 MG tablet Take 220 mg by mouth daily as needed.   Yes [provider]  pantoprazole (PROTONIX) 20 MG tablet Take 1 tablet (20 mg total) by mouth daily. 06/05/17   Burgess Amor, PA-C    Family History Family History  Problem Relation Age of Onset  . Hypertension Mother   . Diabetes Mother   . Thyroid disease Mother   . COPD Mother   . Arthritis Mother   . Depression Mother   . Mental illness Mother   . Stroke Mother   . Prostate cancer Father        unsure primary   . Colon cancer Father   . Diabetes Maternal Grandmother   . Diabetes Paternal Grandmother   . Cancer Sister   . Heart attack Brother   . Colon cancer Brother   . Diabetes Sister   . Depression Sister   . Mental illness Sister   . Colon polyps Sister     Social History Social History   Tobacco Use  . Smoking status: Current Every Day Smoker    Packs/day: 1.00    Years: 24.00    Pack years: 24.00    Types: Cigarettes    Start date: 11/26/1983  . Smokeless tobacco: Never Used  . Tobacco comment: quit 3 times for a total of 8 years since 1985  Substance Use Topics  . Alcohol use: No    Comment: socially just mixed drinks   . Drug use: No      Allergies   Doxycycline; Penicillins; Sulfa antibiotics; and Xanax [alprazolam]   Review of Systems Review of Systems  Constitutional: Negative for fever.  HENT: Negative for congestion and sore throat.   Eyes: Negative.   Respiratory: Negative for chest tightness and shortness of breath.   Cardiovascular: Positive for chest pain. Negative for palpitations and leg swelling.  Gastrointestinal: Negative for abdominal pain, nausea and vomiting.  Genitourinary: Negative.   Musculoskeletal: Negative for arthralgias, joint swelling and neck pain.  Skin: Negative.  Negative for rash and wound.  Neurological: Negative for dizziness, weakness, light-headedness, numbness and headaches.  Psychiatric/Behavioral: Negative.      Physical Exam Updated Vital Signs BP (!) 156/98   Pulse (!) 57   Temp 98.1 F (36.7 C) (Oral)   Resp 18   Ht 5\' 5"  (1.651 m)   Wt 79.4 kg (175 lb)   SpO2 94%   BMI 29.12 kg/m   Physical Exam  Constitutional: She appears well-developed and well-nourished.  HENT:  Head: Normocephalic and atraumatic.  Eyes: Conjunctivae are normal.  Neck: Normal range of motion.  Cardiovascular: Normal rate, regular rhythm, normal heart sounds and intact distal pulses.  No murmur heard. Pulmonary/Chest: Effort normal and breath sounds normal. No respiratory distress. She has no decreased breath sounds. She has no wheezes. She has no rhonchi. She has no rales.  Abdominal: Soft. Bowel sounds are normal. There is no tenderness.  Musculoskeletal: Normal range of motion.       Right lower leg: She exhibits no edema.       Left lower leg: She exhibits no edema.  Neurological: She is alert.  Skin: Skin is warm and dry.  Psychiatric: She has a normal mood and affect.  Nursing note and vitals reviewed.    ED Treatments / Results  Labs (all labs ordered are listed, but only abnormal results are displayed) Labs Reviewed  COMPREHENSIVE METABOLIC PANEL - Abnormal; Notable  for the following components:      Result Value   Potassium 3.3 (*)    Total Protein 6.2 (*)    AST 14 (*)    ALT 11 (*)    All other components within normal limits  CBC WITH DIFFERENTIAL/PLATELET - Abnormal; Notable for the following components:   Platelets 131 (*)    All other components within normal limits  LIPASE, BLOOD  I-STAT TROPONIN, ED  I-STAT TROPONIN, ED    EKG EKG Interpretation  Date/Time:  Tuesday June 05 2017 09:43:47 EDT Ventricular Rate:  71 PR Interval:    QRS Duration: 87 QT Interval:  391 QTC Calculation: 425 R Axis:   25 Text Interpretation:  Sinus rhythm Low voltage, precordial leads No acute changes Nonspecific ST and T wave abnormality Confirmed by Derwood KaplanNanavati, Ankit [phone]([id number]) on 06/05/2017 1:21:44 PM   Radiology Dg Chest 2 View  Result Date: 06/05/2017 CLINICAL DATA:  Mid-sternal chest pain for several hours EXAM: CHEST - 2 VIEW COMPARISON:  11/04/2014 FINDINGS: The heart size and mediastinal contours are within normal limits. Both lungs are clear. The visualized skeletal structures are unremarkable. IMPRESSION: No active cardiopulmonary disease. Electronically Signed   By: Alcide CleverMark  Lukens M.D.   On: 06/05/2017 10:41    Procedures Procedures (including critical care time)  Medications Ordered in ED Medications  famotidine (PEPCID) tablet 20 mg (20 mg Oral Given 06/05/17 1047)  HYDROcodone-acetaminophen (NORCO/VICODIN) 5-325 MG per tablet 1 tablet (1 tablet Oral Given 06/05/17 1305)     Initial Impression / Assessment and Plan / ED Course  I have reviewed the triage vital signs and the nursing notes.  Pertinent labs & imaging results that were available during my care of the patient were reviewed by me and considered in my medical decision making (see chart for details).     Pt took bc powder prior to arrival, no further ASA given.  pepcid PO without improved sx.  Hydrocodone tab given with improved pain from 6 to 4/10.  Delta troponins negative, ekg  normal, doubt ACS  as source of sx, favoring gastritis as possible source of pain.  She was placed on protonix, reporting she was taking this in the past and was helpful.  Referral to pcp for a recheck and referral to Cardiology also for ov for  Consideration of outpt testing given cardiac risk factors.    The patient appears reasonably screened and/or stabilized for discharge and I doubt any other medical condition or other [facility] requiring further screening, evaluation, or treatment in the ED at this time prior to discharge.   Pt was seen by Dr. Rhunette Croft prior to dc home.  Final Clinical Impressions(s) / ED Diagnoses   Final diagnoses:  Atypical chest pain  Gastritis without bleeding, unspecified chronicity, unspecified gastritis type    ED Discharge Orders        Ordered    pantoprazole (PROTONIX) 20 MG tablet  Daily     06/05/17 1446       Burgess Amor, PA-C 06/05/17 1713    Derwood Kaplan, MD 06/05/17 Paulo Fruit

## 2017-06-06 ENCOUNTER — Encounter: Payer: Self-pay | Admitting: Nurse Practitioner

## 2017-06-06 ENCOUNTER — Ambulatory Visit (INDEPENDENT_AMBULATORY_CARE_PROVIDER_SITE_OTHER): Payer: BLUE CROSS/BLUE SHIELD | Admitting: Nurse Practitioner

## 2017-06-06 VITALS — BP 122/80 | Ht 65.0 in | Wt 173.0 lb

## 2017-06-06 DIAGNOSIS — K2100 Gastro-esophageal reflux disease with esophagitis, without bleeding: Secondary | ICD-10-CM

## 2017-06-06 DIAGNOSIS — F419 Anxiety disorder, unspecified: Secondary | ICD-10-CM

## 2017-06-06 DIAGNOSIS — K580 Irritable bowel syndrome with diarrhea: Secondary | ICD-10-CM | POA: Diagnosis not present

## 2017-06-06 MED ORDER — HYDROCODONE-ACETAMINOPHEN 10-325 MG PO TABS: ORAL_TABLET | ORAL | 0 refills | Status: DC

## 2017-06-06 MED ORDER — CITALOPRAM HYDROBROMIDE 20 MG PO TABS: ORAL_TABLET | ORAL | 0 refills | Status: DC

## 2017-06-06 MED ORDER — DIPHENOXYLATE-ATROPINE 2.5-0.025 MG PO TABS: 2.0000 | ORAL_TABLET | Freq: Four times a day (QID) | ORAL | 2 refills | Status: DC | PRN

## 2017-06-06 MED ORDER — POTASSIUM CHLORIDE CRYS ER 10 MEQ PO TBCR: 10.0000 meq | EXTENDED_RELEASE_TABLET | Freq: Every day | ORAL | 2 refills | Status: DC

## 2017-06-06 MED ORDER — DICYCLOMINE HCL 20 MG PO TABS: ORAL_TABLET | ORAL | 2 refills | Status: DC

## 2017-06-07 ENCOUNTER — Encounter: Payer: Self-pay | Admitting: Nurse Practitioner

## 2017-06-07 NOTE — Progress Notes (Signed)
Subjective:  Presents for recheck after ED visit on 06/05/17. Better today. Describes as a sharp pain localized to the upper epigastric area into lower sternal area. Continuous. Takes Prilosec OTC as needed for acid reflux. Has also had a flare up of the IBS diarrhea for the past couple of days. Has noticed problems especially when under more stress. No edema. No SOB. Takes occasional Aleve. Continues to take pain med, usually 1/2 -1 when her knee pain is bad.  Requesting a refill on her pain medication today, states 60 pills were normally last her about 40 days.  Has an appointment scheduled next month for pain management. GAD 7 : Generalized Anxiety Score 06/06/2017  Nervous, Anxious, on Edge 2  Control/stop worrying 2  Worry too much - different things 2  Trouble relaxing 3  Restless 3  Easily annoyed or irritable 3  Afraid - awful might happen 0  Total GAD 7 Score 15  Anxiety Difficulty Extremely difficult   Having significant difficulty with sleep; trouble going to sleep with frequent awakenings.  Also having difficulty getting along with others.  Getting upset easily over simple things.  Objective: NAD. Alert, oriented. Lungs clear. Heart RRR. Abdomen soft, non distended with mild upper epigastric area tenderness.  Thoughts logical coherent and relevant.  Mildly anxious affect.  Crying a few times during office visit.  Dressed appropriately.  Making good eye contact.  Cardiac workup at ED visit was normal.  Assessment:  Problem List Items Addressed This Visit      Digestive   GERD (gastroesophageal reflux disease) - Primary   Relevant Medications   dicyclomine (BENTYL) 20 MG tablet   diphenoxylate-atropine (LOMOTIL) 2.5-0.025 MG tablet   Irritable bowel syndrome with diarrhea   Relevant Medications   dicyclomine (BENTYL) 20 MG tablet   diphenoxylate-atropine (LOMOTIL) 2.5-0.025 MG tablet     Other   Anxiety as acute reaction to exceptional stress   Relevant Medications   citalopram  (CELEXA) 20 MG tablet     Plan:  Meds ordered this encounter  Medications  . potassium chloride (K-DUR,KLOR-CON) 10 MEQ tablet    Sig: Take 1 tablet (10 mEq total) by mouth daily.    Dispense:  30 tablet    Refill:  2    Order Specific Question:   Supervising Provider    Answer:   Merlyn AlbertLUKING, WILLIAM S [[id number]]  . citalopram (CELEXA) 20 MG tablet    Sig: Take 1/2 tab po qhs x 6 d then one po qd    Dispense:  30 tablet    Refill:  0    Order Specific Question:   Supervising Provider    Answer:   Merlyn AlbertLUKING, WILLIAM S [[id number]]  . dicyclomine (BENTYL) 20 MG tablet    Sig: Take one tid prn abdominal cramps    Dispense:  21 tablet    Refill:  2    Order Specific Question:   Supervising Provider    Answer:   Merlyn AlbertLUKING, WILLIAM S [[id number]]  . diphenoxylate-atropine (LOMOTIL) 2.5-0.025 MG tablet    Sig: Take 2 tablets by mouth 4 (four) times daily as needed for diarrhea or loose stools.    Dispense:  40 tablet    Refill:  2    Order Specific Question:   Supervising Provider    Answer:   Merlyn AlbertLUKING, WILLIAM S [[id number]]  . HYDROcodone-acetaminophen (NORCO) 10-325 MG tablet    Sig: 1/2 to 1 bid prn    Dispense:  60 tablet    Refill:  0    Order Specific Question:   Supervising Provider    Answer:   Merlyn Albert [[id number]]   Her diarrhea has been well controlled with occasional use of Bentyl and Lomotil.  These meds were refilled.  Use pain medication sparingly.  Discussed options at length regarding treating her anxiety.  Advised patient that we will not be adding benzodiazepines to her pain medication, patient wants to avoid these anyway due to her mother's history of dependency on these medications.  She agrees to take daily SSRI since this is a daily persistent problem.  Reviewed potential adverse effects of Celexa.  DC med and contact office if any problems. Return for follow up next month as planned. 25 minutes was spent with the patient.  This statement verifies that 25 minutes was indeed spent with the  patient. Greater than half the time was spent in discussion, counseling and answering questions  regarding the issues that the patient came in for today as reflected in the diagnosis (s) please refer to documentation for further details.

## 2017-07-03 ENCOUNTER — Telehealth: Payer: Self-pay | Admitting: Family Medicine

## 2017-07-03 NOTE — Telephone Encounter (Signed)
Patient is requesting refill on hydrocodone 10/325 last filled 06/06/17.

## 2017-07-03 NOTE — Telephone Encounter (Signed)
Last seen 04/13/17 for pain management

## 2017-07-05 ENCOUNTER — Telehealth: Payer: Self-pay | Admitting: Family Medicine

## 2017-07-05 ENCOUNTER — Other Ambulatory Visit: Payer: Self-pay | Admitting: Nurse Practitioner

## 2017-07-05 MED ORDER — HYDROCODONE-ACETAMINOPHEN 10-325 MG PO TABS: ORAL_TABLET | ORAL | 0 refills | Status: DC

## 2017-07-05 NOTE — Telephone Encounter (Signed)
Patient called about this message from a couple days ago. I told her nothing was up front yet.  She wanted to know if Eber Jones could just e-script to [place] since she is back in the office today.

## 2017-07-05 NOTE — Telephone Encounter (Signed)
Left message to return call 

## 2017-07-05 NOTE — Telephone Encounter (Signed)
Message sent to Dr. Lorin Picket on 07/03/17 regarding hydrocodone.

## 2017-07-05 NOTE — Telephone Encounter (Signed)
Sent in one month supply. Needs office visit before further refills. Thanks.

## 2017-07-06 ENCOUNTER — Encounter: Payer: Self-pay | Admitting: Nurse Practitioner

## 2017-07-06 ENCOUNTER — Ambulatory Visit (INDEPENDENT_AMBULATORY_CARE_PROVIDER_SITE_OTHER): Payer: BLUE CROSS/BLUE SHIELD | Admitting: Nurse Practitioner

## 2017-07-06 VITALS — BP 122/80 | Ht 65.0 in | Wt 176.0 lb

## 2017-07-06 DIAGNOSIS — E876 Hypokalemia: Secondary | ICD-10-CM | POA: Diagnosis not present

## 2017-07-06 DIAGNOSIS — F411 Generalized anxiety disorder: Secondary | ICD-10-CM

## 2017-07-06 DIAGNOSIS — K2100 Gastro-esophageal reflux disease with esophagitis, without bleeding: Secondary | ICD-10-CM

## 2017-07-06 DIAGNOSIS — F430 Acute stress reaction: Secondary | ICD-10-CM | POA: Diagnosis not present

## 2017-07-06 NOTE — Telephone Encounter (Signed)
Hold for visit today. Will refill then.

## 2017-07-06 NOTE — Telephone Encounter (Signed)
Pt has appt with Eber Jones today-according to drug registry her last hydrocodone prescription was filled on June 06, 2017

## 2017-07-07 ENCOUNTER — Encounter: Payer: Self-pay | Admitting: Nurse Practitioner

## 2017-07-07 LAB — POTASSIUM: Potassium: 4 mmol/L (ref 3.5–5.2)

## 2017-07-07 NOTE — Progress Notes (Signed)
Subjective: Presents for routine follow-up.  Her boyfriend and daughter are present today per her request.  Celexa is working very well for her anxiety.  Had come off her pantoprazole for 4 to 5 days, forgot to pick up her prescription.  Woke up one night with trouble swallowing and laryngeal spasm, this went away fairly quickly.  Symptoms have completely resolved since restarting her pantoprazole.  No abdominal pain.  No overt reflux symptoms.  Also had some hypokalemia on a previous lab.  Takes her potassium most days.  Has had some mild leg cramps.  Objective:   BP 122/80   Ht  (1.651 m)   Wt 176 lb (79.8 kg)   BMI 29.29 kg/m  NAD.  Alert, oriented.  Cheerful affect.  Calm.  Thoughts logical coherent and relevant.  Dressed appropriately.  Making good eye contact.  Lungs clear.  Heart regular rate and rhythm.  Abdomen soft nondistended nontender.  Assessment:   Problem List Items Addressed This Visit      Digestive   GERD (gastroesophageal reflux disease)     Other   Anxiety as acute reaction to exceptional stress    Other Visit Diagnoses    Hypokalemia    -  Primary   Relevant Orders   Potassium (Completed)       Plan: Patient states the pharmacy overlooked her refill on her pain medicine, this was sent in earlier.  PMP reviewed which verified patient's statement.  Continue Celexa as directed.  Continue pantoprazole for now, discussed potential risk associated with chronic PPI use.  Follow-up as planned for pain management.  Recheck potassium level.

## 2017-07-09 NOTE — Telephone Encounter (Signed)
Pt.notified

## 2017-07-18 ENCOUNTER — Other Ambulatory Visit: Payer: Self-pay | Admitting: Nurse Practitioner

## 2017-07-30 ENCOUNTER — Other Ambulatory Visit: Payer: Self-pay | Admitting: Nurse Practitioner

## 2017-08-24 ENCOUNTER — Encounter: Payer: Self-pay | Admitting: Nurse Practitioner

## 2017-08-24 ENCOUNTER — Other Ambulatory Visit: Payer: Self-pay | Admitting: Nurse Practitioner

## 2017-08-24 NOTE — Telephone Encounter (Signed)
PMP reviewed. Will send message to nurses. Needs an appointment specifically for pain management before further refills.

## 2017-09-14 ENCOUNTER — Ambulatory Visit: Payer: BLUE CROSS/BLUE SHIELD | Admitting: Nurse Practitioner

## 2017-09-25 ENCOUNTER — Other Ambulatory Visit: Payer: Self-pay | Admitting: *Deleted

## 2017-09-25 ENCOUNTER — Telehealth: Payer: Self-pay | Admitting: *Deleted

## 2017-09-25 MED ORDER — DIPHENOXYLATE-ATROPINE 2.5-0.025 MG PO TABS: 2.0000 | ORAL_TABLET | Freq: Four times a day (QID) | ORAL | 2 refills | Status: DC | PRN

## 2017-09-25 NOTE — Telephone Encounter (Signed)
Left message to return call. Received PA for dexilant 60mg . I do not see med on pt's med list. Called pt to clarify if she is taking med.

## 2017-09-26 ENCOUNTER — Encounter: Payer: Self-pay | Admitting: Nurse Practitioner

## 2017-09-26 ENCOUNTER — Ambulatory Visit: Payer: BLUE CROSS/BLUE SHIELD | Admitting: Nurse Practitioner

## 2017-09-26 VITALS — BP 122/80 | Ht 65.0 in | Wt 177.0 lb

## 2017-09-26 DIAGNOSIS — Z79891 Long term (current) use of opiate analgesic: Secondary | ICD-10-CM

## 2017-09-26 DIAGNOSIS — M170 Bilateral primary osteoarthritis of knee: Secondary | ICD-10-CM | POA: Diagnosis not present

## 2017-09-26 MED ORDER — HYDROCODONE-ACETAMINOPHEN 10-325 MG PO TABS: ORAL_TABLET | ORAL | 0 refills | Status: DC

## 2017-09-26 NOTE — Progress Notes (Signed)
Subjective: This patient was seen today for chronic pain  The medication list was reviewed and updated.   -Compliance with medication: yes  - Number patient states they take daily: 1-2 x per day; only takes as needed.  -when was the last dose patient took? 3-4 days ago  The patient was advised the importance of maintaining medication and not using illegal substances with these.  Here for refills and follow up  The patient was educated that we can provide 3 monthly scripts for their medication, it is their responsibility to follow the instructions.  Side effects or complications from medications: None  Patient is aware that pain medications are meant to minimize the severity of the pain to allow their pain levels to improve to allow for better function. They are aware of that pain medications cannot totally remove their pain.  Due for UDT ( at least once per year) : Hold on this today since patient has not taken medication in at least 3 days.  Plan to do testing at next visit. PMP reviewed. Patient hit right knee during vacation; took more pain med after injury; better now. Left knee worse.  May eventually need surgery in this knee as well.  Objective: NAD.  Alert, oriented.  Lungs clear.  Heart regular rate and rhythm.  Assessment: Problem List Items Addressed This Visit      Musculoskeletal and Integument   Arthritis of both knees (Chronic)   Relevant Medications   HYDROcodone-acetaminophen (NORCO) 10-325 MG tablet     Other   Encounter for long-term opiate analgesic use - Primary     Plan: Meds ordered this encounter  Medications  . DISCONTD: HYDROcodone-acetaminophen (NORCO) 10-325 MG tablet    Sig: TAKE 1/2 TO 1 TABLET BY MOUTH 2 TIMES A DAY AS NEEDED.    Dispense:  60 tablet    Refill:  0    Order Specific Question:   Supervising Provider    Answer:   Merlyn AlbertLUKING, WILLIAM S [[id number]]  . DISCONTD: HYDROcodone-acetaminophen (NORCO) 10-325 MG tablet    Sig: TAKE 1/2 TO 1  TABLET BY MOUTH 2 TIMES A DAY AS NEEDED.    Dispense:  60 tablet    Refill:  0    May fill 30 days from 09/26/17    Order Specific Question:   Supervising Provider    Answer:   Merlyn AlbertLUKING, WILLIAM S [[id number]]  . HYDROcodone-acetaminophen (NORCO) 10-325 MG tablet    Sig: TAKE 1/2 TO 1 TABLET BY MOUTH 2 TIMES A DAY AS NEEDED.    Dispense:  60 tablet    Refill:  0    May fill 60 days from 09/26/17    Order Specific Question:   Supervising Provider    Answer:   Merlyn AlbertLUKING, WILLIAM S [[id number]]   Return in about 3 months (around 12/27/2017) for pain management.

## 2017-11-23 ENCOUNTER — Encounter: Payer: Self-pay | Admitting: Family Medicine

## 2017-11-23 ENCOUNTER — Ambulatory Visit: Payer: BLUE CROSS/BLUE SHIELD | Admitting: Family Medicine

## 2017-11-23 VITALS — BP 138/86 | Temp 97.8°F | Ht 65.0 in | Wt 177.2 lb

## 2017-11-23 DIAGNOSIS — K580 Irritable bowel syndrome with diarrhea: Secondary | ICD-10-CM

## 2017-11-23 MED ORDER — DICYCLOMINE HCL 20 MG PO TABS: ORAL_TABLET | ORAL | 5 refills | Status: DC

## 2017-11-23 MED ORDER — CHOLESTYRAMINE 4 G PO PACK: PACK | ORAL | 5 refills | Status: DC

## 2017-11-23 NOTE — Progress Notes (Signed)
Subjective:    Patient ID: [name], female    DOB: [birth year], 47 y.o.   MRN: [id number]  HPI  Pt here today due to IBS, reports having this since gallbladder removed about 4-5 years ago . Pt has had this for years. Pt is on Bentyl but has been unable to get it. Bentyl does help somewhat, takes about 2-3 times per day when having a flare up.  Reports having 2-3 flares a month for the last year, where she is out for about 3 days d/t diarrhea.  States seems unrelated to what she's eating.  Has also been using Lomotil. Pt states she has been missing work with this. Reports having urgent need for diarrhea.  Denies fever. Reports she gets nauseated and then she sneezes, and then that relieves it; this has been happening for about a week. Lomotil helps with the diarrhea, reports intermittent cramping abdominal pain with this as well. Heating pads and bentyl somewhat helpful. Denies blood in stool, states stool sometimes looks greasy.  Reports stools are loose.   Reports she has seen GI Dr. Darrick Penna for this before but it's been several years ago.   Headache everyday ongoing for a long time per pt- frontal h/a, throbbing, denies vision changes, once had some nausea, denies sensitivity to light or sounds, denies pain waking up from sleep. Takes BCs for this, 2-3 everyday, which relieves symptoms.   Review of Systems  Constitutional: Negative for fever and unexpected weight change.  Gastrointestinal: Positive for abdominal pain, diarrhea and nausea. Negative for blood in stool.  Neurological: Positive for headaches.       Objective:   Physical Exam  Constitutional: She is oriented to person, place, and time. She appears well-developed and well-nourished. No distress.  HENT:  Head: Normocephalic and atraumatic.  Eyes: Right eye exhibits no discharge. Left eye exhibits no discharge.  Neck: Neck supple.  Cardiovascular: Normal rate, regular rhythm and normal heart sounds.  No murmur  heard. Pulmonary/Chest: Effort normal and breath sounds normal. No respiratory distress.  Abdominal: Soft. Bowel sounds are normal. She exhibits no distension and no mass. There is tenderness (bilateral lower quadrant tenderness).  Neurological: She is alert and oriented to person, place, and time.  Skin: Skin is warm and dry.  Psychiatric: She has a normal mood and affect.  Nursing note and vitals reviewed.     Assessment & Plan:  1. Irritable bowel syndrome with diarrhea  Encouraged patient to continue with current medication regimen, refills given on bentyl, but add 1/2 a pack of the questran on days she is having a flare up.  She will f/u with [place] in the next week or so and if symptoms are not improved we will need to get labs on her.  Warning signs discussed. She has a f/u appt scheduled in October for refills on her chronic meds.  Discussed the effect of rebound headaches with continued BC use and encouraged her to wean off of using the [facility] powders, also offered to see her for this at a separate f/u appt if she has struggles with this.   - cholestyramine (QUESTRAN) 4 g packet; Take 1 pack bid prn as directed  Dispense: 60 each; Refill: 5 - dicyclomine (BENTYL) 20 MG tablet; TAKE (1) TABLET BY MOUTH THREE TIMES DAILY AS NEEDED FOR ABDOMINAL CRAMPS.  Dispense: 40 tablet; Refill: 5  As attending physician to this patient visit, this patient was seen in conjunction with the nurse practitioner.  The history,physical  and treatment plan was reviewed with the nurse practitioner and pertinent findings were verified with the patient.  Also the treatment plan was reviewed with the patient while they were present. SAL

## 2017-12-24 ENCOUNTER — Ambulatory Visit (INDEPENDENT_AMBULATORY_CARE_PROVIDER_SITE_OTHER): Payer: BLUE CROSS/BLUE SHIELD | Admitting: Family Medicine

## 2017-12-24 ENCOUNTER — Encounter: Payer: Self-pay | Admitting: Family Medicine

## 2017-12-24 VITALS — BP 122/84 | Ht 65.0 in | Wt 175.4 lb

## 2017-12-24 DIAGNOSIS — F419 Anxiety disorder, unspecified: Secondary | ICD-10-CM

## 2017-12-24 DIAGNOSIS — M170 Bilateral primary osteoarthritis of knee: Secondary | ICD-10-CM

## 2017-12-24 DIAGNOSIS — M79605 Pain in left leg: Secondary | ICD-10-CM | POA: Diagnosis not present

## 2017-12-24 DIAGNOSIS — Z79891 Long term (current) use of opiate analgesic: Secondary | ICD-10-CM | POA: Diagnosis not present

## 2017-12-24 MED ORDER — HYDROCODONE-ACETAMINOPHEN 10-325 MG PO TABS: ORAL_TABLET | ORAL | 0 refills | Status: DC

## 2017-12-24 MED ORDER — CITALOPRAM HYDROBROMIDE 20 MG PO TABS: ORAL_TABLET | ORAL | 5 refills | Status: DC

## 2017-12-24 NOTE — Progress Notes (Signed)
Subjective:    Patient ID: [name], female    DOB: 1970/10/11, 47 y.o.   MRN: [id number]  HPI This patient was seen today for chronic pain  The medication list was reviewed and updated.   -Compliance with medication: yes  - Number patient states they take daily: 1-2 if having flare otherwise has not taken. Takes up to 2 daily, some weeks takes everyday, some weeks doesn't need any.   -when was the last dose patient took? 10/24  The patient was advised the importance of maintaining medication and not using illegal substances with these.  Here for refills and follow up  The patient was educated that we can provide 3 monthly scripts for their medication, it is their responsibility to follow the instructions.  Side effects or complications from medications: none  Patient is aware that pain medications are meant to minimize the severity of the pain to allow their pain levels to improve to allow for better function. They are aware of that pain medications cannot totally remove their pain.  Due for UDT ( at least once per year) : have not taken but a couple pills in last several weeks. Will obtain UDT today since she has not had one in over a year in order to be compliant with [place] law.   Has been weaning off of BC powders, now taking 2 per day as opposed to previously taking 4-5 per day.   Only takes medication when she is not working or at home, never when driving. Denies any illegal drug use. States pain medication helps relieve knee pain enough to tolerate and function better.   Intermittent throbbing pain to left lateral lower leg x 5 days. Lasts only a few seconds and then resolves. No aggravating factors noted.   Anxiety: Doing well on celexa. Compliant with medication. Reports it helps relieve her anxiety and improve her mood. Denies adverse effects. Denies SI/HI. Would like to continue.  Review of Systems  Respiratory: Negative for shortness of breath.   Cardiovascular:  Negative for chest pain.  Musculoskeletal: Positive for arthralgias.  Psychiatric/Behavioral: Negative for suicidal ideas.       Objective:   Physical Exam  Constitutional: She is oriented to person, place, and time. She appears well-developed and well-nourished. No distress.  HENT:  Head: Normocephalic and atraumatic.  Neck: Neck supple.  Cardiovascular: Normal rate, regular rhythm and normal heart sounds.  No murmur heard. Pulmonary/Chest: Effort normal and breath sounds normal. No respiratory distress.  Musculoskeletal:  LLE without edema, non-tender to touch, no redness, heat or bruising noted. L4, L5, S1 strength intact.   Neurological: She is alert and oriented to person, place, and time.  Skin: Skin is warm and dry.  Psychiatric: She has a normal mood and affect.  Nursing note and vitals reviewed.     Assessment & Plan:  1. Long-term current use of opiate analgesic - Plan: ToxASSURE Select 13 (MW), Urine The patient was seen in followup for chronic pain. A review over at their current pain status was discussed. Drug registry was checked. Prescriptions were given. Discussion was held regarding the importance of compliance with medication as well as pain medication contract.  Time for questions regarding pain management plan occurred. Importance of regular followup visits was discussed. Patient was informed that medication may cause drowsiness and should not be combined  with other medications/alcohol or street drugs. Patient was cautioned that medication could cause drowsiness. If the patient feels medication is causing altered alertness  then do not drive or operate dangerous equipment.  UDT today. F/u in 3 months.  2. Arthritis of both knees See plan for #1  3. Anxiety Doing well on celexa, will refill.   4. Pain of left lower extremity Likely r/t her chronic low back pain/sciatica. Reassuring that it is intermittent and only lasting for a short time. Recommend f/u  if symptoms become more frequent and lasting longer.   Dr. Lilyan Punt was consulted on this case and is in agreement with the above treatment plan.

## 2017-12-29 LAB — SPECIMEN STATUS REPORT

## 2017-12-29 LAB — TOXASSURE SELECT 13 (MW), URINE

## 2018-01-14 ENCOUNTER — Encounter: Payer: Self-pay | Admitting: Family Medicine

## 2018-01-15 ENCOUNTER — Encounter: Payer: Self-pay | Admitting: Family Medicine

## 2018-01-15 ENCOUNTER — Ambulatory Visit: Payer: BLUE CROSS/BLUE SHIELD | Admitting: Family Medicine

## 2018-01-15 VITALS — Temp 98.5°F | Ht 65.0 in | Wt 178.4 lb

## 2018-01-15 DIAGNOSIS — J019 Acute sinusitis, unspecified: Secondary | ICD-10-CM | POA: Diagnosis not present

## 2018-01-15 DIAGNOSIS — B9689 Other specified bacterial agents as the cause of diseases classified elsewhere: Secondary | ICD-10-CM | POA: Diagnosis not present

## 2018-01-15 MED ORDER — LEVOFLOXACIN 500 MG PO TABS: 500.0000 mg | ORAL_TABLET | Freq: Every day | ORAL | 0 refills | Status: DC

## 2018-01-15 MED ORDER — FLUCONAZOLE 150 MG PO TABS: 150.0000 mg | ORAL_TABLET | Freq: Once | ORAL | 4 refills | Status: AC

## 2018-01-15 NOTE — Progress Notes (Signed)
   Subjective:    Patient ID: [name], female    DOB: 09/06/1970, 47 y.o.   MRN: [id number]  Cough  This is a new problem. The current episode started in the past 7 days. Associated symptoms include chills, ear pain, a fever, headaches, myalgias, nasal congestion, rhinorrhea and a sore throat. Pertinent negatives include no chest pain, shortness of breath or wheezing. Treatments tried: alka seltzer cold and flu.  Relates she feels bad low energy low-grade fever head congestion sinus pressure pain discomfort no wheezing no vomiting   Review of Systems  Constitutional: Positive for chills and fever. Negative for activity change.  HENT: Positive for congestion, ear pain, rhinorrhea and sore throat.   Eyes: Negative for discharge.  Respiratory: Positive for cough. Negative for shortness of breath and wheezing.   Cardiovascular: Negative for chest pain.  Musculoskeletal: Positive for myalgias.  Neurological: Positive for headaches.       Objective:   Physical Exam  Constitutional: She appears well-developed.  HENT:  Head: Normocephalic.  Nose: Nose normal.  Mouth/Throat: Oropharynx is clear and moist. No oropharyngeal exudate.  Neck: Neck supple.  Cardiovascular: Normal rate and normal heart sounds.  No murmur heard. Pulmonary/Chest: Effort normal and breath sounds normal. She has no wheezes.  Lymphadenopathy:    She has no cervical adenopathy.  Skin: Skin is warm and dry.  Nursing note and vitals reviewed.  Patient not toxic no sign of pneumonia no need for lab work or x-rays More than likely picked up a viral illness which triggered a sinus infection cough is related to postnasal drainage will gradually get better      Assessment & Plan:  Patient was seen today for upper respiratory illness. It is felt that the patient is dealing with sinusitis.  Antibiotics were prescribed today. Importance of compliance with medication was discussed.  Symptoms should gradually resolve  over the course of the next several days. If high fevers, progressive illness, difficulty breathing, worsening condition or failure for symptoms to improve over the next several days then the patient is to follow-up.  If any emergent conditions the patient is to follow-up in the emergency department otherwise to follow-up in the office.

## 2018-01-17 ENCOUNTER — Other Ambulatory Visit: Payer: Self-pay

## 2018-01-17 MED ORDER — DEXLANSOPRAZOLE 60 MG PO CPDR: 1.0000 | DELAYED_RELEASE_CAPSULE | Freq: Every day | ORAL | 5 refills | Status: DC

## 2018-01-17 NOTE — Telephone Encounter (Signed)
Please send in Dexilant 60 mg 1 daily, #30, 5 refills the patient would stop any other PPI if any ongoing trouble she needs to follow-up otherwise follow-up within 6 months

## 2018-01-18 ENCOUNTER — Telehealth: Payer: Self-pay | Admitting: *Deleted

## 2018-01-18 NOTE — Telephone Encounter (Signed)
Dexilant 60mg  #30 one qd approved by insurance. Approval good 01/18/18-01/16/21. Pharmacy notified.

## 2018-01-22 ENCOUNTER — Encounter: Payer: Self-pay | Admitting: Family Medicine

## 2018-01-23 ENCOUNTER — Encounter: Payer: Self-pay | Admitting: Family Medicine

## 2018-01-23 ENCOUNTER — Other Ambulatory Visit: Payer: Self-pay | Admitting: Family Medicine

## 2018-01-23 MED ORDER — DIPHENOXYLATE-ATROPINE 2.5-0.025 MG PO TABS: 2.0000 | ORAL_TABLET | Freq: Four times a day (QID) | ORAL | 1 refills | Status: DC | PRN

## 2018-01-28 ENCOUNTER — Telehealth: Payer: Self-pay | Admitting: Family Medicine

## 2018-01-28 MED ORDER — CLINDAMYCIN HCL 300 MG PO CAPS: ORAL_CAPSULE | ORAL | 0 refills | Status: DC

## 2018-01-28 NOTE — Telephone Encounter (Signed)
If the patient would want to be seen we can do that or we can send in another round of antibiotics  Clindamycin 300 mg 1 3 times daily for 7 days if ongoing troubles I recommend an office visit

## 2018-01-28 NOTE — Telephone Encounter (Signed)
Patient was given levaquin 01/15/18 for sinus infection

## 2018-01-28 NOTE — Telephone Encounter (Signed)
Patient wanted the antibx sent in so she did not have to miss work. She is aware we sent to the requested pharmacy,and to call us with any further problems.

## 2018-01-28 NOTE — Telephone Encounter (Signed)
Pt seen 01/15/2018 for cough/congestion, pt states medication did not seem to help her she is experiencing symptoms of (R) ear pain, and mucus drainage in throat, advise.

## 2018-03-21 ENCOUNTER — Ambulatory Visit: Payer: BLUE CROSS/BLUE SHIELD | Admitting: Family Medicine

## 2018-03-21 ENCOUNTER — Encounter: Payer: Self-pay | Admitting: Family Medicine

## 2018-03-21 VITALS — BP 132/86 | Ht 65.0 in | Wt 181.1 lb

## 2018-03-21 DIAGNOSIS — K219 Gastro-esophageal reflux disease without esophagitis: Secondary | ICD-10-CM

## 2018-03-21 DIAGNOSIS — M170 Bilateral primary osteoarthritis of knee: Secondary | ICD-10-CM

## 2018-03-21 DIAGNOSIS — R232 Flushing: Secondary | ICD-10-CM | POA: Insufficient documentation

## 2018-03-21 DIAGNOSIS — Z79891 Long term (current) use of opiate analgesic: Secondary | ICD-10-CM | POA: Diagnosis not present

## 2018-03-21 DIAGNOSIS — F321 Major depressive disorder, single episode, moderate: Secondary | ICD-10-CM | POA: Diagnosis not present

## 2018-03-21 DIAGNOSIS — F419 Anxiety disorder, unspecified: Secondary | ICD-10-CM | POA: Diagnosis not present

## 2018-03-21 DIAGNOSIS — K580 Irritable bowel syndrome with diarrhea: Secondary | ICD-10-CM

## 2018-03-21 MED ORDER — CITALOPRAM HYDROBROMIDE 40 MG PO TABS: ORAL_TABLET | ORAL | 5 refills | Status: DC

## 2018-03-21 MED ORDER — COLESTIPOL HCL 1 G PO TABS: 1.0000 g | ORAL_TABLET | Freq: Two times a day (BID) | ORAL | 3 refills | Status: DC

## 2018-03-21 MED ORDER — HYDROCODONE-ACETAMINOPHEN 10-325 MG PO TABS: ORAL_TABLET | ORAL | 0 refills | Status: DC

## 2018-03-21 NOTE — Progress Notes (Signed)
Subjective:    Patient ID: [name], female    DOB: [birth year], 48 y.o.   MRN: [id number]  HPI Patient is here today to follow up on her chronic health issues,and her pain management.  She has a history of Genella RifeGerd and is taking otc Rantidine. I advised that it has been recalled and she may want to discard it and she could try famotidine as a replacement if ok with the Dr. Was taking ranitidine one pill per month. States she stopped the dexilant because she wasn't needing it all the time and it was expensive, felt the ranitidine was helpful. Reports she's figured out her trigger foods and tries to avoid them.   Reports having issues with diarrhea since gallbladder surgery, states lomotil and questran have helped but still has episodes, and Lanetta Inchquestran is not covered by insurance. Reports significant family hx of IBS. Bentyl is helpful for her cramping. Reports having about 1 episode lasting 2-3 days out of the month.   History of Anxiety taking Celexa 20 mg once per day. Unsure if celexa is helpful, but states still gets easily agitated. States she is tired all the time.   Reports hot flashes regularly throughout the day and night, mostly chest and face area. LMP has been a while, unsure of when, but has been within a year, thinks early 2019. Has been using fans.   This patient was seen today for chronic pain  The medication list was reviewed and updated.   -Compliance with medication: Hydrocodone 10-325 mg 1/2 -1 Bid. Usually only needs it 1-2 times per week, depends on how much she is up on her feet at work. States some months she does need it everyday, just depends mainly on her knee pain.   - Number patient states they take daily: 0-2 ( Had an abscessed tooth last week and was taking it Q 6 hours at that time)  -when was the last dose patient took? Monday  The patient was advised the importance of maintaining medication and not using illegal substances with these.  Here for refills  and follow up  The patient was educated that we can provide 3 monthly scripts for their medication, it is their responsibility to follow the instructions.  Side effects or complications from medications: No  Patient is aware that pain medications are meant to minimize the severity of the pain to allow their pain levels to improve to allow for better function. They are aware of that pain medications cannot totally remove their pain.  Due for UDT ( at least once per year) : 12/25/2018  Also states she was given Cholestyramine 4 g take one pack bid as directed was not covered by her insurance. Is there something else she can try?   Review of Systems  Constitutional: Negative for fever.  Respiratory: Negative for shortness of breath.   Cardiovascular: Negative for chest pain.  Gastrointestinal: Positive for abdominal pain and diarrhea. Negative for blood in stool, nausea and vomiting.  Psychiatric/Behavioral: Positive for agitation and sleep disturbance. The patient is nervous/anxious.        Objective:   Physical Exam Vitals signs and nursing note reviewed.  Constitutional:      General: She is not in acute distress.    Appearance: Normal appearance. She is well-developed. She is not toxic-appearing.  HENT:     Head: Normocephalic and atraumatic.  Neck:     Musculoskeletal: Neck supple.  Cardiovascular:     Rate and Rhythm: Normal rate  and regular rhythm.     Heart sounds: Normal heart sounds. No murmur.  Pulmonary:     Effort: Pulmonary effort is normal. No respiratory distress.     Breath sounds: Normal breath sounds.  Abdominal:     General: Bowel sounds are normal. There is no distension.     Palpations: Abdomen is soft. There is no mass.     Tenderness: There is no abdominal tenderness.  Lymphadenopathy:     Cervical: No cervical adenopathy.  Skin:    General: Skin is warm and dry.  Neurological:     Mental Status: She is alert and oriented to person, place, and time.   Psychiatric:        Mood and Affect: Mood normal.        Behavior: Behavior normal.        Thought Content: Thought content normal.    Depression screen [facility] 2/9 03/21/2018 04/13/2017  Decreased Interest 2 1  Down, Depressed, Hopeless 1 0  PHQ - 2 Score 3 1  Altered sleeping 3 -  Tired, decreased energy 3 -  Change in appetite 3 -  Feeling bad or failure about yourself  1 -  Trouble concentrating 1 -  Moving slowly or fidgety/restless 0 -  Suicidal thoughts 0 -  PHQ-9 Score 14 -  Difficult doing work/chores Somewhat difficult -   GAD 7 : Generalized Anxiety Score 03/21/2018 06/06/2017  Nervous, Anxious, on Edge 3 2  Control/stop worrying 1 2  Worry too much - different things 1 2  Trouble relaxing 2 3  Restless 0 3  Easily annoyed or irritable 3 3  Afraid - awful might happen 0 0  Total GAD 7 Score 10 15  Anxiety Difficulty Somewhat difficult Extremely difficult            Assessment & Plan:  1. Long-term current use of opiate analgesic The patient was seen in followup for chronic pain. A review over at their current pain status was discussed. Drug registry was checked. Prescriptions were given. Discussion was held regarding the importance of compliance with medication as well as pain medication contract.  Time for questions regarding pain management plan occurred. Importance of regular followup visits was discussed. Patient was informed that medication may cause drowsiness and should not be combined  with other medications/alcohol or street drugs. Patient was cautioned that medication could cause drowsiness. If the patient feels medication is causing altered alertness then do not drive or operate dangerous equipment.  F/u 3 months.   2. Anxiety Patient does not feel anxiety is well controlled on current dose of Celexa.  PHQ 9 reveals patient also has an element of depression.  She is not suicidal.  She understands she develops suicidal ideation she needs to seek  emergency care.  Recommend increasing the dose of Celexa to 40 mg daily.  This could also potentially help with her hot flashes.  She should notify us if she has any problems.  Otherwise we will keep regular scheduled follow-up in 3 months.  3. Depression See #2 plan  4. Gastroesophageal reflux disease, esophagitis presence not specified Patient wants to stop proton pump inhibitor.  States she does not have daily problems with reflux.  States reflux is well controlled with over-the-counter meds.  Instructed to stop ranitidine due to recall and replace it with famotidine as needed.  5. Irritable bowel syndrome with diarrhea Will try Colestid, and see if this is covered by her insurance.  If it is not  or if it is not helping over the next month recommend referral to GI.  Warning signs discussed.  We will follow-up at next visit.  6. Hot flashes Likely related to perimenopause.  Will trial increased dose of SSRI and see if this is helpful.  Dr. Lilyan PuntScott Luking was consulted on this case and is in agreement with the above treatment plan.

## 2018-05-20 ENCOUNTER — Telehealth: Payer: Self-pay | Admitting: Family Medicine

## 2018-05-20 ENCOUNTER — Other Ambulatory Visit: Payer: Self-pay | Admitting: Family Medicine

## 2018-05-20 MED ORDER — HYDROCODONE-ACETAMINOPHEN 10-325 MG PO TABS: ORAL_TABLET | ORAL | 0 refills | Status: DC

## 2018-05-20 NOTE — Telephone Encounter (Signed)
Patient states over the past weekend she has had to use medication of HYDROcodone-acetaminophen (NORCO) 10-325 MG tablet more frequently than normal due to back pain, patient would like to go ahead a have medication refilled at pharmacy, pharmacy states medication for fill is 2-3 days early, advise.    Pharmacy:  [facility] - [place], [place] - [address]

## 2018-05-20 NOTE — Telephone Encounter (Signed)
Discussed with pt. Pt verbalized understanding.  °

## 2018-05-20 NOTE — Telephone Encounter (Signed)
Left message to return call to get more info.  

## 2018-05-20 NOTE — Telephone Encounter (Signed)
So it is important for this patient to still continue to try to take Advil or Aleve to help I can send in a new prescription for the patient that she can take up to 4/day If she needs to be seen this week we can see her on a morning visit If she is having to do 4/day beyond next 5 days days she will need to be seen

## 2018-05-20 NOTE — Telephone Encounter (Signed)
Left message to return call 

## 2018-05-20 NOTE — Telephone Encounter (Signed)
Back hurts to sit or lay. Had to roll to get out of bed. Trying to stand and walk as much as possible. This started on Friday. Has been taking extra pain pills. Taking one about every 4 hours. Has 3 -4  pills left now. Pt states her next date to refill is Thursday the 26th. Wants to get filled early. Tried aleve and advil and it does not touch pain. Heating pad does help ease pain but she is still working. States her job is not shutting down. Ok to leave voicemail per pt.   Gilmer apoth.

## 2018-05-31 ENCOUNTER — Telehealth: Payer: Self-pay | Admitting: Adult Health

## 2018-05-31 NOTE — Telephone Encounter (Signed)
No answer

## 2018-05-31 NOTE — Telephone Encounter (Signed)
Patient requesting a call from you to discuss some questions that she has.

## 2018-06-14 ENCOUNTER — Other Ambulatory Visit: Payer: Self-pay

## 2018-06-14 ENCOUNTER — Ambulatory Visit (INDEPENDENT_AMBULATORY_CARE_PROVIDER_SITE_OTHER): Payer: BLUE CROSS/BLUE SHIELD | Admitting: Family Medicine

## 2018-06-14 DIAGNOSIS — K580 Irritable bowel syndrome with diarrhea: Secondary | ICD-10-CM

## 2018-06-14 DIAGNOSIS — K219 Gastro-esophageal reflux disease without esophagitis: Secondary | ICD-10-CM | POA: Diagnosis not present

## 2018-06-14 DIAGNOSIS — G8929 Other chronic pain: Secondary | ICD-10-CM | POA: Diagnosis not present

## 2018-06-14 DIAGNOSIS — M5441 Lumbago with sciatica, right side: Secondary | ICD-10-CM

## 2018-06-14 MED ORDER — HYDROCODONE-ACETAMINOPHEN 10-325 MG PO TABS: ORAL_TABLET | ORAL | 0 refills | Status: DC

## 2018-06-14 MED ORDER — DEXLANSOPRAZOLE 60 MG PO CPDR: 1.0000 | DELAYED_RELEASE_CAPSULE | Freq: Every day | ORAL | 5 refills | Status: DC

## 2018-06-14 NOTE — Progress Notes (Signed)
Subjective:    Patient ID: [name], female    DOB: [birth year], 48 y.o.   MRN: [id number]  HPI This patient was seen today for chronic pain. Takes for lower back pain and sciatcia, left knee pain.   The medication list was reviewed and updated.   -Compliance with medication: did have to take more on last refill due to back pain but states she did call office first and got approval.   - Number patient states they take daily: one half to one tablet one to two times a week.   -when was the last dose patient took? Took one last Saturday or Sunday the last time  The patient was advised the importance of maintaining medication and not using illegal substances with these.  Here for refills and follow up  The patient was educated that we can provide 3 monthly scripts for their medication, it is their responsibility to follow the instructions.  Side effects or complications from medications: none  Patient is aware that pain medications are meant to minimize the severity of the pain to allow their pain levels to improve to allow for better function. They are aware of that pain medications cannot totally remove their pain.  Due for UDT ( at least once per year) : last one 12/24/17  Virtual Visit via Video Note  I connected with [name] on 06/14/18 at  1:40 PM EDT by a video enabled telemedicine application and verified that I am speaking with the correct person using two identifiers.   I discussed the limitations of evaluation and management by telemedicine and the availability of in person appointments. The patient expressed understanding and agreed to proceed.  History of Present Illness:    Observations/Objective:   Assessment and Plan:   Follow Up Instructions:    I discussed the assessment and treatment plan with the patient. The patient was provided an opportunity to ask questions and all were answered. The patient agreed with the plan and demonstrated an  understanding of the instructions.   The patient was advised to call back or seek an in-person evaluation if the symptoms worsen or if the condition fails to improve as anticipated.  I provided 15 minutes of non-face-to-face time during this encounter. Patient is under a lot of stress her daughter recently had miscarriage the baby's heartbeat can no longer be her they are awaiting the actual miscarriage she is handling this is best she can but at times gets tearful she is taking her Celexa  She also relates significant reflux related issues.  She is interested in restarting PPI  Patient has ongoing back pain discomfort worse when she works a lot of hours relates she has had significant pain discomfort not feeling good denies high fever chills sweats wheezing difficulty breathing         Review of Systems     Objective:   Physical Exam        Assessment & Plan:  Stress overall handling it fairly well continue citalopram recommend counseling if ongoing trouble  Not depressed currently but very stressed about family situation see above  GERD under decent control until recently restart PPI  Chronic back pain drug registry checked 3 prescription sent and she takes medicine 2 or 3 times per day number 75/month she states without the pain medicine she cannot function she denies abusing it The patient was seen in followup for chronic pain. A review over at their current pain status was discussed. Drug  registry was checked. Prescriptions were given. Discussion was held regarding the importance of compliance with medication as well as pain medication contract.  Time for questions regarding pain management plan occurred. Importance of regular followup visits was discussed. Patient was informed that medication may cause drowsiness and should not be combined  with other medications/alcohol or street drugs. Patient was cautioned that medication could cause drowsiness. If the patient  feels medication is causing altered alertness then do not drive or operate dangerous equipment.  Follow-up in 3 months

## 2018-06-15 ENCOUNTER — Other Ambulatory Visit: Payer: Self-pay | Admitting: Family Medicine

## 2018-06-17 ENCOUNTER — Telehealth: Payer: Self-pay | Admitting: Family Medicine

## 2018-06-17 NOTE — Telephone Encounter (Signed)
PA needed for Dexilant 60 mg capsules. Take one capsule by mouth daily. PA submitted; awaiting decision.

## 2018-06-17 NOTE — Telephone Encounter (Signed)
Immediate response approved. Contacted pharmacy; pharmacy states that is is still saying PA required but will retry later due to the fact it takes the pharmacy a little while longer to get results. Contacted patient and informed her that Dexilant 60 mg capsules have been approved from 06/17/2018-06/15/2021. Pt verbalized understanding.

## 2018-07-19 ENCOUNTER — Other Ambulatory Visit: Payer: Self-pay | Admitting: Family Medicine

## 2018-08-13 ENCOUNTER — Other Ambulatory Visit: Payer: Self-pay | Admitting: Family Medicine

## 2018-08-28 ENCOUNTER — Other Ambulatory Visit: Payer: Self-pay

## 2018-08-28 DIAGNOSIS — Z20822 Contact with and (suspected) exposure to covid-19: Secondary | ICD-10-CM

## 2018-08-29 ENCOUNTER — Other Ambulatory Visit: Payer: Self-pay

## 2018-08-29 ENCOUNTER — Ambulatory Visit (INDEPENDENT_AMBULATORY_CARE_PROVIDER_SITE_OTHER): Payer: BC Managed Care – PPO | Admitting: Family Medicine

## 2018-08-29 DIAGNOSIS — R5383 Other fatigue: Secondary | ICD-10-CM | POA: Diagnosis not present

## 2018-08-29 DIAGNOSIS — M545 Low back pain, unspecified: Secondary | ICD-10-CM

## 2018-08-29 DIAGNOSIS — R110 Nausea: Secondary | ICD-10-CM

## 2018-08-29 DIAGNOSIS — G8929 Other chronic pain: Secondary | ICD-10-CM

## 2018-08-29 DIAGNOSIS — Z13220 Encounter for screening for lipoid disorders: Secondary | ICD-10-CM

## 2018-08-29 DIAGNOSIS — Z79891 Long term (current) use of opiate analgesic: Secondary | ICD-10-CM

## 2018-08-29 DIAGNOSIS — R232 Flushing: Secondary | ICD-10-CM

## 2018-08-29 MED ORDER — ONDANSETRON 8 MG PO TBDP: 8.0000 mg | ORAL_TABLET | Freq: Three times a day (TID) | ORAL | 0 refills | Status: DC | PRN

## 2018-08-29 MED ORDER — HYDROCODONE-ACETAMINOPHEN 10-325 MG PO TABS: ORAL_TABLET | ORAL | 0 refills | Status: DC

## 2018-08-29 MED ORDER — CITALOPRAM HYDROBROMIDE 20 MG PO TABS: 20.0000 mg | ORAL_TABLET | Freq: Every day | ORAL | 5 refills | Status: DC

## 2018-08-29 NOTE — Progress Notes (Addendum)
Subjective:    Patient ID: [name], female    DOB: 06-15-1970, 48 y.o.   MRN: [id number]  HPI  Patient calls to discuss dizziness and nausea. Patient states it feels like motion sickness or car sickness. It started a few weeks ago when she was driving to work. Then the next weekend it hit her again when she was driving and has to stop and get dramamine. Patient states the last time it happen she was not driving or in car but moving around and cleaning her house.  Patient stats she is going out of town this weekend and needs something so she doesn't get this feeling again.  We talked about this at length.  It sounds like the patient is having some symptomatology of potential side effects from her high dose of her Celexa we discussed reducing the dose of this  She also has some potential menopause issues we will check lab work  Virtual Visit via Video Note  I connected with [name] on 08/29/18 at  3:00 PM EDT by a video enabled telemedicine application and verified that I am speaking with the correct person using two identifiers.  Location: Patient: home Provider: office   I discussed the limitations of evaluation and management by telemedicine and the availability of in person appointments. The patient expressed understanding and agreed to proceed.  History of Present Illness:    Observations/Objective:   Assessment and Plan:   Follow Up Instructions:    I discussed the assessment and treatment plan with the patient. The patient was provided an opportunity to ask questions and all were answered. The patient agreed with the plan and demonstrated an understanding of the instructions.   This patient was seen today for chronic pain  The medication list was reviewed and updated.   -Compliance with medication: She is compliant with her medication she states she takes anywhere between half a tablet to a full tablet twice daily but sometimes 3 times daily she states  that she is doing is causing a lot of pain in her back she is tried anti-inflammatories in the past without getting adequate relief plus also she is on Celexa so therefore it is not recommended to be on anti-inflammatories currently in addition to this she does do home stretches as well as gentle exercises without much success in relieving the pain.  She also tried gabapentin for several years and it caused a lot of drowsiness but did not necessarily help her pain  - Number patient states they take daily: She takes between 2 and 3 tablets/day  -when was the last dose patient took?  Earlier today  The patient was advised the importance of maintaining medication and not using illegal substances with these.  Here for refills and follow up  The patient was educated that we can provide 3 monthly scripts for their medication, it is their responsibility to follow the instructions.  Side effects or complications from medications: She denies any side effects with the medication.  She states that the pain medication does take the edge off her pain makes it the pain level lowered to where she can function better without it she would have a hard time working in accomplishing what she needs to do for day-to-day activities of daily living  Patient is aware that pain medications are meant to minimize the severity of the pain to allow their pain levels to improve to allow for better function. They are aware of that pain medications  cannot totally remove their pain.  Due for UDT ( at least once per year) : We do a urine drug screen on a yearly basis on today's visit it is virtual so we will target urine drug screen on next visit      The patient was advised to call back or seek an in-person evaluation if the symptoms worsen or if the condition fails to improve as anticipated.  I provided 25 minutes of non-face-to-face time during this encounter.   25 minutes was spent with the patient.  This statement  verifies that 25 minutes was indeed spent with the patient.  More than 50% of this visit-total duration of the visit-was spent in counseling and coordination of care. The issues that the patient came in for today as reflected in the diagnosis (s) please refer to documentation for further details.  Given the complexities of her situation renewing that we will pain medicine as well as lab work [phone]     Review of Systems  Constitutional: Negative for activity change, appetite change and fatigue.  HENT: Negative for congestion and rhinorrhea.   Respiratory: Negative for cough and shortness of breath.   Cardiovascular: Negative for chest pain and leg swelling.  Gastrointestinal: Negative for abdominal pain and diarrhea.  Endocrine: Negative for polydipsia and polyphagia.  Skin: Negative for color change.  Neurological: Positive for dizziness. Negative for weakness.  Psychiatric/Behavioral: Negative for behavioral problems and confusion.   The dizziness she describes is not vertigo.  We did discuss that if she starts having one-sided weakness or anything to think of a stroke to be seen but otherwise more than likely this is related possibly to the high dose of her Celexa we will reduce the medication to see if that helps    Objective:   Physical Exam Patient had virtual visit Appears to be in no distress Atraumatic Neuro able to relate and oriented No apparent resp distress Color normal        Assessment & Plan:  1. Nausea We will go ahead with Zofran as needed reduce Celexa to see if this helps  2. Hot flashes Check to see if she is not truly menopausal at this point - FSH/LH - TSH  3. Chronic bilateral low back pain, unspecified whether sciatica present Pain medications were refilled The patient was seen in followup for chronic pain. A review over at their current pain status was discussed. Drug registry was checked. Prescriptions were given. Discussion was held regarding  the importance of compliance with medication as well as pain medication contract.  Time for questions regarding pain management plan occurred. Importance of regular followup visits was discussed. Patient was informed that medication may cause drowsiness and should not be combined  with other medications/alcohol or street drugs. Patient was cautioned that medication could cause drowsiness. If the patient feels medication is causing altered alertness then do not drive or operate dangerous equipment.  Follow-up in approximately 3 months  4. Other fatigue Check thyroid function - TSH  5. Long-term current use of opiate analgesic Continue pain medicine drug registry was checked - Basic metabolic panel - Hepatic function panel  6. Screening, lipid Screening lipid - Lipid panel   Follow-up later this year with Chrys Racer if ongoing hot flashes

## 2018-08-29 NOTE — Addendum Note (Signed)
Addended by: Sallee Lange A on: 08/29/2018 05:47 PM   Modules accepted: Level of Service

## 2018-09-04 LAB — NOVEL CORONAVIRUS, NAA: SARS-CoV-2, NAA: NOT DETECTED

## 2018-09-10 ENCOUNTER — Other Ambulatory Visit: Payer: Self-pay | Admitting: Family Medicine

## 2018-09-13 ENCOUNTER — Telehealth: Payer: Self-pay | Admitting: *Deleted

## 2018-09-13 NOTE — Telephone Encounter (Signed)
Patient switched insurance 08/28/2018 and new insurance does not cover Dexilant 40mg . Please advise

## 2018-09-14 NOTE — Telephone Encounter (Signed)
So essentially this is a stab in the dark since there is no way to be able to know what is covered? #1 inform the patient 2.  I recommend Protonix it is a good acid blocker medicine works as well as others and it is also a PPI like the current medicine.  40 mg, 1 daily, may have 90-day with 1 refill If that is not covered I will need a list of what is covered by her insurance company before I can progress any further

## 2018-09-16 MED ORDER — PANTOPRAZOLE SODIUM 40 MG PO TBEC: 40.0000 mg | DELAYED_RELEASE_TABLET | Freq: Every day | ORAL | 1 refills | Status: DC

## 2018-09-16 NOTE — Telephone Encounter (Signed)
Left message to return call 

## 2018-09-16 NOTE — Telephone Encounter (Signed)
Pt returned call. Pt verbalized understanding. Protonix 40 mg sent to [facility]

## 2018-09-19 ENCOUNTER — Telehealth: Payer: Self-pay | Admitting: Family Medicine

## 2018-09-19 NOTE — Telephone Encounter (Signed)
Form from [facility] regarding Hydrocodone 10-325 tablets. In provider office for review and signature. Please advise. Thank you

## 2018-09-26 NOTE — Telephone Encounter (Signed)
Form and office visit note from July 2nd sent in. Await decision.

## 2018-09-26 NOTE — Telephone Encounter (Signed)
Nurses I filled out the form I would recommend sending in documentation from her last office visit for pain management July 2 along with this form

## 2018-10-07 ENCOUNTER — Other Ambulatory Visit: Payer: Self-pay

## 2018-10-07 ENCOUNTER — Encounter: Payer: Self-pay | Admitting: Family Medicine

## 2018-10-07 ENCOUNTER — Ambulatory Visit (INDEPENDENT_AMBULATORY_CARE_PROVIDER_SITE_OTHER): Payer: BC Managed Care – PPO | Admitting: Family Medicine

## 2018-10-07 DIAGNOSIS — A084 Viral intestinal infection, unspecified: Secondary | ICD-10-CM | POA: Diagnosis not present

## 2018-10-07 DIAGNOSIS — Z20822 Contact with and (suspected) exposure to covid-19: Secondary | ICD-10-CM

## 2018-10-07 NOTE — Progress Notes (Signed)
   Subjective:    Patient ID: [name], female    DOB: [birth year], 48 y.o.   MRN: [id number]  HPI This patient relates she was feeling well till this past Friday started having fatigue tiredness to some degree having some diarrhea issues.  Some nausea as well now over the weekend multiple aspects of diarrhea not mucousy nonbloody.  Energy level subpar.  Feels headache body aches some sweats also sneezing today no wheezing or difficulty breathing  Patient calls with abdominal pain and diarrhea and headache since yesterday. No fever but was made to leave work with her symptoms. Patient states they told her she would need a note to return to work.  Virtual Visit via Video Note  I connected with CLEDITH KAMIYA on 10/07/18 at 11:30 AM EDT by a video enabled telemedicine application and verified that I am speaking with the correct person using two identifiers.  Location: Patient: home Provider: office   I discussed the limitations of evaluation and management by telemedicine and the availability of in person appointments. The patient expressed understanding and agreed to proceed.  History of Present Illness:    Observations/Objective:   Assessment and Plan:   Follow Up Instructions:    I discussed the assessment and treatment plan with the patient. The patient was provided an opportunity to ask questions and all were answered. The patient agreed with the plan and demonstrated an understanding of the instructions.   The patient was advised to call back or seek an in-person evaluation if the symptoms worsen or if the condition fails to improve as anticipated.  I provided 15 minutes of non-face-to-face time during this encounter.     Review of Systems  Constitutional: Negative for activity change and fever.  HENT: Positive for congestion and sneezing. Negative for ear pain and rhinorrhea.   Eyes: Negative for discharge.  Respiratory: Negative for cough, shortness of breath and  wheezing.   Cardiovascular: Negative for chest pain.  Gastrointestinal: Positive for abdominal pain, diarrhea and nausea.       Objective:   Physical Exam  Today's visit was via telephone Physical exam was not possible for this visit       Assessment & Plan:  Possible COVID infection Viral diarrhea Supportive measures discussed in detail I recommend highly that the patient stay self quarantine also recommend she go for drive-through testing and rest up over the course of the next several days we wrote her a work note for the next 10 days we will touch base with her in a couple days warning signs were discussed in detail

## 2018-10-08 LAB — NOVEL CORONAVIRUS, NAA: SARS-CoV-2, NAA: NOT DETECTED

## 2018-10-09 ENCOUNTER — Telehealth: Payer: Self-pay | Admitting: Family Medicine

## 2018-10-09 NOTE — Telephone Encounter (Signed)
PA completed for pt Hydrocodone 10-325 mg tablets. PA has been approved from 10/09/2018-11/07/2018

## 2018-10-09 NOTE — Telephone Encounter (Signed)
Pt contacted and states that Hydrocodone 10-325 mg have been approved from 10/09/2018-11/07/2018. Pt states she is trying to get her medication on automatic refill. Pt has not picked up medications at this time

## 2018-10-10 ENCOUNTER — Other Ambulatory Visit: Payer: Self-pay | Admitting: Family Medicine

## 2018-10-30 ENCOUNTER — Telehealth: Payer: Self-pay | Admitting: Family Medicine

## 2018-10-30 NOTE — Telephone Encounter (Signed)
Contacted patient and informed her that positive daughter should quarantine for 10 days, the family should stay in place for 2 weeks. Pt states she is going to get tested tomorrow just in case. Pt verbalized understanding. Pt did ask we sent this through my chart so she could have something to show her work. Sent message through my chart as requested.

## 2018-10-30 NOTE — Telephone Encounter (Signed)
Mickala calling for advice.  Daughter Judson Roch (11[birth year]12-00) just came home from [place] and was advised to get a covid test.  She has no symptoms at all but got the call today from [facility] that she is positive. She was told that since she shows no symptoms that she only has to quarantine for 7 days.  Mom wants to make sure this is correct.  She has sent her other 3 kids to [facility] for testing.  Mom was sent home from work since her daughter was positive and she is going to be tested tomorrow but needs to know how long to quarantine? 7,10 or 14 days?

## 2018-10-30 NOTE — Telephone Encounter (Signed)
The child who is positive should self quarantine for 10 days regardless of symptoms- obviously if she should start becoming ill with the virus it may have to be in quarantine longer than 10 days  As for [name] if a  COVID positive person is living in the household then it is necessary for all members of the household to self quarantine for 2 weeks-they can work from home but should not go out during that time  [name] should also be aware that even if she test negative it is recommended for self quarantine for 2 weeks if her daughter is living there at her residence

## 2018-10-30 NOTE — Telephone Encounter (Signed)
Please advise. Thank you

## 2018-10-31 ENCOUNTER — Other Ambulatory Visit: Payer: Self-pay

## 2018-10-31 DIAGNOSIS — Z20822 Contact with and (suspected) exposure to covid-19: Secondary | ICD-10-CM

## 2018-11-01 ENCOUNTER — Ambulatory Visit (INDEPENDENT_AMBULATORY_CARE_PROVIDER_SITE_OTHER): Payer: BC Managed Care – PPO | Admitting: Nurse Practitioner

## 2018-11-01 ENCOUNTER — Encounter: Payer: Self-pay | Admitting: Nurse Practitioner

## 2018-11-01 DIAGNOSIS — Z79891 Long term (current) use of opiate analgesic: Secondary | ICD-10-CM

## 2018-11-01 DIAGNOSIS — M1712 Unilateral primary osteoarthritis, left knee: Secondary | ICD-10-CM

## 2018-11-01 DIAGNOSIS — M5441 Lumbago with sciatica, right side: Secondary | ICD-10-CM | POA: Diagnosis not present

## 2018-11-01 DIAGNOSIS — G8929 Other chronic pain: Secondary | ICD-10-CM

## 2018-11-01 LAB — NOVEL CORONAVIRUS, NAA: SARS-CoV-2, NAA: NOT DETECTED

## 2018-11-01 MED ORDER — HYDROCODONE-ACETAMINOPHEN 10-325 MG PO TABS: ORAL_TABLET | ORAL | 0 refills | Status: DC

## 2018-11-01 NOTE — Progress Notes (Signed)
Phone visit Subjective:    Patient ID: [name], female    DOB: [birth year], 48 y.o.   MRN: [id number]  HPI This patient was seen today for chronic pain. Takes for knee and back pain.   The medication list was reviewed and updated.   -Compliance with medication: does not take daily. Starts with a half tablet and may take up to 2 tablets daily.   - Number patient states they take daily: up to 2 per day  -when was the last dose patient took? tuesday  The patient was advised the importance of maintaining medication and not using illegal substances with these.  Here for refills and follow up  The patient was educated that we can provide 3 monthly scripts for their medication, it is their responsibility to follow the instructions.  Side effects or complications from medications: none  Patient is aware that pain medications are meant to minimize the severity of the pain to allow their pain levels to improve to allow for better function. They are aware of that pain medications cannot totally remove their pain.  Due for UDT ( at least once per year) :  Last seen 12/24/17   Would like results of covid test that was done yesterday. She is in quarantine since her daughter was tested positive.   Virtual Visit via Telephone Note  I connected with JOSSLYN CIOLEK on 11/01/18 at 11:20 AM EDT by telephone and verified that I am speaking with the correct person using two identifiers.  Location: Patient: home Provider: office   I discussed the limitations, risks, security and privacy concerns of performing an evaluation and management service by telephone and the availability of in person appointments. I also discussed with the patient that there may be a patient responsible charge related to this service. The patient expressed understanding and agreed to proceed.   History of Present Illness: Presents for recheck for pain management see above notes.  Has had right knee replacement, left  knee is now causing her significant trouble, will need replacement eventually.  Also has chronic back pain.  Takes her hydrocodone only if needed up to 2/day.  Worse on the days she works, does walking for almost 12 hours.  Limited time sitting.  Patient is able to work and do her household chores with use of the pain medication.  Also takes occasional OTC anti-inflammatory if needed.  Denies any constipation or adverse effects from pain medication.  Was switched to Protonix since her insurance no longer covers Dexilant.  Dexilant worked much better.   Observations/Objective: Today's visit was via telephone Physical exam was not possible for this visit Alert, oriented.  Calm cheerful affect.  Thoughts logical coherent and relevant.  Speech clear.  Assessment and Plan: Problem List Items Addressed This Visit      Musculoskeletal and Integument   Arthritis of knee, degenerative   Relevant Medications   HYDROcodone-acetaminophen ([place]) 10-325 MG tablet     Other   Encounter for long-term opiate analgesic use - Primary   Low back pain   Relevant Medications   HYDROcodone-acetaminophen (NORCO) 10-325 MG tablet     Meds ordered this encounter  Medications  . DISCONTD: HYDROcodone-acetaminophen (NORCO) 10-325 MG tablet    Sig: Take one tid prn pain    Dispense:  75 tablet    Refill:  0    Order Specific Question:   Supervising Provider    Answer:   Sallee Lange A [[id number]]  . DISCONTD: HYDROcodone-acetaminophen ([place])  10-325 MG tablet    Sig: Take one tid prn pain    Dispense:  75 tablet    Refill:  0    May fill 30 days from 11/01/18    Order Specific Question:   Supervising Provider    Answer:   Lilyan PuntLUKING, SCOTT A [[id number]]  . HYDROcodone-acetaminophen (NORCO) 10-325 MG tablet    Sig: Take one tid prn pain    Dispense:  75 tablet    Refill:  0    May fill 60 days from 11/01/18    Order Specific Question:   Supervising Provider    Answer:   Lilyan PuntLUKING, SCOTT A [[id number]]     Follow Up  Instructions: Continue current pain medication regimen.  Limit anti-inflammatories due to reflux. Return in about 3 months (around 01/31/2019) for Pain management .     I discussed the assessment and treatment plan with the patient. The patient was provided an opportunity to ask questions and all were answered. The patient agreed with the plan and demonstrated an understanding of the instructions.   The patient was advised to call back or seek an in-person evaluation if the symptoms worsen or if the condition fails to improve as anticipated.  I provided 15 minutes of non-face-to-face time during this encounter.          Review of Systems     Objective:   Physical Exam        Assessment & Plan:

## 2018-11-08 ENCOUNTER — Other Ambulatory Visit: Payer: Self-pay | Admitting: *Deleted

## 2018-11-08 DIAGNOSIS — Z20822 Contact with and (suspected) exposure to covid-19: Secondary | ICD-10-CM

## 2018-11-09 LAB — NOVEL CORONAVIRUS, NAA: SARS-CoV-2, NAA: NOT DETECTED

## 2019-01-02 ENCOUNTER — Other Ambulatory Visit: Payer: Self-pay

## 2019-01-02 ENCOUNTER — Ambulatory Visit (INDEPENDENT_AMBULATORY_CARE_PROVIDER_SITE_OTHER): Payer: BC Managed Care – PPO | Admitting: Family Medicine

## 2019-01-02 ENCOUNTER — Encounter: Payer: Self-pay | Admitting: Family Medicine

## 2019-01-02 DIAGNOSIS — R519 Headache, unspecified: Secondary | ICD-10-CM

## 2019-01-02 DIAGNOSIS — R067 Sneezing: Secondary | ICD-10-CM

## 2019-01-02 NOTE — Progress Notes (Addendum)
   Subjective:    Patient ID: [name], female    DOB: 12/07/1970, 48 y.o.   MRN: [id number] Virtual Visit via Video Note  I connected with [name] on 01/26/19 at 10:30 AM EST by a video enabled telemedicine application and verified that I am speaking with the correct person using two identifiers.  Location: Patient: home Provider: office   I discussed the limitations of evaluation and management by telemedicine and the availability of in person appointments. The patient expressed understanding and agreed to proceed.  History of Present Illness:    Observations/Objective:  home Assessment and Plan:   Follow Up Instructions:    I discussed the assessment and treatment plan with the patient. The patient was provided an opportunity to ask questions and all were answered. The patient agreed with the plan and demonstrated an understanding of the instructions.   The patient was advised to call back or seek an in-person evaluation if the symptoms worsen or if the condition fails to improve as anticipated.  I provided 17 minutes of non-face-to-face time during this encounter.   Sallee Lange, MD   HPI Patient calls with bad headache since Tuesday. Patient states she is also having nausea and sneezing. Patient states it is like something is tickling her nose and making her sneeze- ? Sinus infection- no meds will help the headache.  Patient with bad headache over the past few days she also relates some intermittent sneezing and some runny nose no wheezing or difficulty breathing.  She does not know if her blood pressure is elevated.  Energy level is subpar.  PMH benign   Review of Systems  Constitutional: Negative for activity change, appetite change and fatigue.  HENT: Positive for rhinorrhea. Negative for congestion.   Respiratory: Negative for cough and shortness of breath.   Cardiovascular: Negative for chest pain and leg swelling.  Gastrointestinal: Negative for  abdominal pain and diarrhea.  Endocrine: Negative for polydipsia and polyphagia.  Skin: Negative for color change.  Neurological: Positive for headaches. Negative for dizziness and weakness.  Psychiatric/Behavioral: Negative for behavioral problems and confusion.       Objective:   Physical Exam Patient had virtual visit Appears to be in no distress Atraumatic Neuro able to relate and oriented No apparent resp distress Color normal  She was instructed to go to the ER if progressive troubles or worse I did inform the patient that if she should start feeling worse she should go home if she starts running fever to go home or significant sore throat cough go home    Assessment & Plan:  It is possible this could be early Covid It is also possible could be blood pressure related Patient will come out tomorrow at 11 AM to be seen and evaluated.  May need Covid testing depending on what is going on with

## 2019-01-03 ENCOUNTER — Ambulatory Visit (INDEPENDENT_AMBULATORY_CARE_PROVIDER_SITE_OTHER): Payer: BC Managed Care – PPO | Admitting: Family Medicine

## 2019-01-03 VITALS — BP 142/92

## 2019-01-03 DIAGNOSIS — G44209 Tension-type headache, unspecified, not intractable: Secondary | ICD-10-CM | POA: Diagnosis not present

## 2019-01-03 DIAGNOSIS — I10 Essential (primary) hypertension: Secondary | ICD-10-CM

## 2019-01-03 MED ORDER — AMLODIPINE BESYLATE 2.5 MG PO TABS: 2.5000 mg | ORAL_TABLET | Freq: Every day | ORAL | 3 refills | Status: DC

## 2019-01-03 MED ORDER — CHLORZOXAZONE 500 MG PO TABS: ORAL_TABLET | ORAL | 0 refills | Status: DC

## 2019-01-03 NOTE — Progress Notes (Signed)
   Subjective:    Patient ID: [name], female    DOB: 10-02-1970, 48 y.o.   MRN: [id number]  HPIheadache for 3 days ago, Started sneezing 2 nights ago, was seeing floaters yesterday, fatigue and not feeling well from the headache. Tried advil, bc and hydrocodone 10/325. Headache has been constant. And now having nausea.   Patient with significant headache over the past few days is a global headache. Patient with intermittent headaches over the past 3 days.  No fever chills sweats no wheezing difficulty breathing did have some sneezing denies muscle aches or pain.  Patient has been under some stress with her work situation that is resolving Also uncertain of her blood pressure Review of Systems  Constitutional: Negative for activity change and appetite change.  HENT: Negative for congestion and rhinorrhea.   Respiratory: Negative for cough and shortness of breath.   Cardiovascular: Negative for chest pain and leg swelling.  Gastrointestinal: Negative for abdominal pain, nausea and vomiting.  Skin: Negative for color change.  Neurological: Positive for headaches. Negative for dizziness, syncope, weakness, light-headedness and numbness.  Psychiatric/Behavioral: Negative for agitation and confusion.       Objective:   Physical Exam Vitals signs reviewed.  Constitutional:      General: She is not in acute distress. HENT:     Head: Normocephalic and atraumatic.  Eyes:     General:        Right eye: No discharge.        Left eye: No discharge.  Neck:     Trachea: No tracheal deviation.  Cardiovascular:     Rate and Rhythm: Normal rate and regular rhythm.     Heart sounds: Normal heart sounds. No murmur.  Pulmonary:     Effort: Pulmonary effort is normal. No respiratory distress.     Breath sounds: Normal breath sounds.  Lymphadenopathy:     Cervical: No cervical adenopathy.  Skin:    General: Skin is warm and dry.  Neurological:     Mental Status: She is alert.   Coordination: Coordination normal.  Psychiatric:        Behavior: Behavior normal.   Throat appears normal heart regular no murmurs blood pressure elevated on 2 separate readings        Assessment & Plan:  Elevated blood pressure I doubt Covid If patient starts running fever or getting worse obviously will need recheck and potential Covid testing I believe headache is probably a combination of tension headache as well as blood pressure Amlodipine 2.5 mg daily Muscle relaxer may be used in the evening time as needed to see if that helps Avoid NSAIDs avoid Goody powders Follow-up with Hoyle Sauer as previously scheduled to recheck blood pressure and recheck headache Currently I do not feel the patient needs to have CT scan

## 2019-01-09 ENCOUNTER — Other Ambulatory Visit: Payer: Self-pay | Admitting: Family Medicine

## 2019-01-10 ENCOUNTER — Other Ambulatory Visit: Payer: Self-pay

## 2019-01-10 ENCOUNTER — Ambulatory Visit (INDEPENDENT_AMBULATORY_CARE_PROVIDER_SITE_OTHER): Payer: BC Managed Care – PPO | Admitting: Nurse Practitioner

## 2019-01-10 DIAGNOSIS — I10 Essential (primary) hypertension: Secondary | ICD-10-CM | POA: Diagnosis not present

## 2019-01-10 DIAGNOSIS — G44209 Tension-type headache, unspecified, not intractable: Secondary | ICD-10-CM | POA: Diagnosis not present

## 2019-01-10 DIAGNOSIS — F411 Generalized anxiety disorder: Secondary | ICD-10-CM

## 2019-01-10 DIAGNOSIS — F430 Acute stress reaction: Secondary | ICD-10-CM

## 2019-01-10 DIAGNOSIS — H9319 Tinnitus, unspecified ear: Secondary | ICD-10-CM | POA: Diagnosis not present

## 2019-01-10 NOTE — Progress Notes (Signed)
   Subjective:    Patient ID: [name], female    DOB: 08-24-1970, 48 y.o.   MRN: [id number]  Headache  This is a new problem. Associated symptoms comments: Seen Dr.Scott last week in office and was told to keep this appt as a follow up. Pt was placed on Amlodipine 2.5 mg daily. Pt is not having headaches as bad. .   Virtual Visit via Video Note  I connected with FANTASHIA SHUPERT on 01/10/19 at  2:40 PM EST by a video enabled telemedicine application and verified that I am speaking with the correct person using two identifiers.  Location: Patient: home Provider: office   I discussed the limitations of evaluation and management by telemedicine and the availability of in person appointments. The patient expressed understanding and agreed to proceed.  History of Present Illness: Presents by phone for recheck on her tension headaches. Symptoms began to improve about 48 hours after starting Amlodipine after last visit. No major headaches since then. Mild occasional headaches. Limited use of OTC meds. Attributes severe headache to extreme stress at work. Was being harassed by a colleague which began in July and worsened up until her last office visit. Eight other employees also came forward and this person was fired. 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 Began having some dizziness about 1 1/2 weeks ago. Mild ringing in the ears mainly noticed at night. No hearing loss. No syncope. Has a history of severe motion sickness. Can only ride in a car if she is driving. BP at home 130/90.   Observations/Objective: Today's visit was via telephone Physical exam was not possible for this visit Alert, oriented. Cheerful, calm affect.   Assessment and Plan: Problem List Items Addressed This Visit      Other   Anxiety as acute reaction to exceptional stress    Other Visit Diagnoses    Tension headache    -  Primary   Essential hypertension       Tinnitus, unspecified laterality          Follow Up Instructions: Continue current regimen. Monitor BP outside of office. Has follow up appointment in a couple of weeks for pain management. Will recheck dizziness/tinnitus at that time. Call back in the meantime if worse.  Anxiety, stress and headaches have all improved due to change in circumstances.     I discussed the assessment and treatment plan with the patient. The patient was provided an opportunity to ask questions and all were answered. The patient agreed with the plan and demonstrated an understanding of the instructions.   The patient was advised to call back or seek an in-person evaluation if the symptoms worsen or if the condition fails to improve as anticipated.  I provided 15 minutes of non-face-to-face time during this encounter.       Review of Systems  Neurological: Positive for headaches.       Objective:   Physical Exam        Assessment & Plan:

## 2019-01-11 ENCOUNTER — Encounter: Payer: Self-pay | Admitting: Nurse Practitioner

## 2019-01-12 ENCOUNTER — Encounter: Payer: Self-pay | Admitting: Nurse Practitioner

## 2019-01-15 ENCOUNTER — Other Ambulatory Visit: Payer: Self-pay

## 2019-01-15 DIAGNOSIS — Z20822 Contact with and (suspected) exposure to covid-19: Secondary | ICD-10-CM

## 2019-01-17 ENCOUNTER — Other Ambulatory Visit: Payer: Self-pay | Admitting: Nurse Practitioner

## 2019-01-17 LAB — HEPATIC FUNCTION PANEL
ALT: 14 IU/L (ref 0–32)
AST: 14 IU/L (ref 0–40)
Albumin: 4.2 g/dL (ref 3.8–4.8)
Alkaline Phosphatase: 98 IU/L (ref 39–117)
Bilirubin Total: <0.2 mg/dL (ref 0.0–1.2)
Bilirubin, Direct: 0.06 mg/dL (ref 0.00–0.40)
Total Protein: 6.2 g/dL (ref 6.0–8.5)

## 2019-01-17 LAB — NOVEL CORONAVIRUS, NAA: SARS-CoV-2, NAA: NOT DETECTED

## 2019-01-17 LAB — BASIC METABOLIC PANEL
BUN/Creatinine Ratio: 24 — ABNORMAL HIGH (ref 9–23)
BUN: 20 mg/dL (ref 6–24)
CO2: 22 mmol/L (ref 20–29)
Calcium: 9.8 mg/dL (ref 8.7–10.2)
Chloride: 105 mmol/L (ref 96–106)
Creatinine, Ser: 0.84 mg/dL (ref 0.57–1.00)
GFR calc Af Amer: 94 mL/min/{1.73_m2} (ref >59)
GFR calc non Af Amer: 81 mL/min/{1.73_m2} (ref >59)
Glucose: 93 mg/dL (ref 65–99)
Potassium: 3.9 mmol/L (ref 3.5–5.2)
Sodium: 143 mmol/L (ref 134–144)

## 2019-01-17 LAB — LIPID PANEL
Chol/HDL Ratio: 3.3 ratio (ref 0.0–4.4)
Cholesterol, Total: 153 mg/dL (ref 100–199)
HDL: 46 mg/dL (ref >39)
LDL Chol Calc (NIH): 82 mg/dL (ref 0–99)
Triglycerides: 140 mg/dL (ref 0–149)
VLDL Cholesterol Cal: 25 mg/dL (ref 5–40)

## 2019-01-17 LAB — FSH/LH
FSH: 83.3 m[IU]/mL
LH: 39.3 m[IU]/mL

## 2019-01-17 LAB — TSH: TSH: 1.73 u[IU]/mL (ref 0.450–4.500)

## 2019-01-17 LAB — SPECIMEN STATUS REPORT

## 2019-01-17 MED ORDER — AMLODIPINE BESYLATE 2.5 MG PO TABS: 2.5000 mg | ORAL_TABLET | Freq: Every day | ORAL | 0 refills | Status: DC

## 2019-01-17 MED ORDER — CITALOPRAM HYDROBROMIDE 20 MG PO TABS: 20.0000 mg | ORAL_TABLET | Freq: Every day | ORAL | 0 refills | Status: DC

## 2019-01-17 MED ORDER — PANTOPRAZOLE SODIUM 40 MG PO TBEC: 40.0000 mg | DELAYED_RELEASE_TABLET | Freq: Every day | ORAL | 0 refills | Status: DC

## 2019-01-17 MED ORDER — ONDANSETRON 8 MG PO TBDP: ORAL_TABLET | ORAL | 0 refills | Status: DC

## 2019-01-21 ENCOUNTER — Other Ambulatory Visit: Payer: Self-pay | Admitting: Family Medicine

## 2019-01-21 ENCOUNTER — Telehealth: Payer: Self-pay | Admitting: Family Medicine

## 2019-01-21 NOTE — Telephone Encounter (Signed)
Nurses Please reach out to [name] Apparently her insurance runs out in the next few days Her daughter stated that she was trying to be seen for purposes of renewing her medications.  Unfortunately I am not here this Friday but I would be willing to do a virtual visit on Wednesday if she is willing to do so  Please talk with the patient and see how we can help

## 2019-01-21 NOTE — Telephone Encounter (Signed)
Contacted patient and pt states she would love a virtual appt tomorrow. Pt wanted me to let Dr.Scott know that if she could give him a hug she would but due to [place] she is sending a virtual hug. Pt transferred up front to set up appt tomorrow.

## 2019-01-22 ENCOUNTER — Other Ambulatory Visit: Payer: Self-pay

## 2019-01-22 ENCOUNTER — Encounter: Payer: Self-pay | Admitting: Family Medicine

## 2019-01-22 ENCOUNTER — Ambulatory Visit (INDEPENDENT_AMBULATORY_CARE_PROVIDER_SITE_OTHER): Payer: BC Managed Care – PPO | Admitting: Family Medicine

## 2019-01-22 DIAGNOSIS — I10 Essential (primary) hypertension: Secondary | ICD-10-CM | POA: Diagnosis not present

## 2019-01-22 DIAGNOSIS — R232 Flushing: Secondary | ICD-10-CM

## 2019-01-22 DIAGNOSIS — K580 Irritable bowel syndrome with diarrhea: Secondary | ICD-10-CM | POA: Diagnosis not present

## 2019-01-22 HISTORY — DX: Essential (primary) hypertension: I10

## 2019-01-22 MED ORDER — AMLODIPINE BESYLATE 2.5 MG PO TABS: 2.5000 mg | ORAL_TABLET | Freq: Every day | ORAL | 1 refills | Status: DC

## 2019-01-22 MED ORDER — PANTOPRAZOLE SODIUM 40 MG PO TBEC: 40.0000 mg | DELAYED_RELEASE_TABLET | Freq: Every day | ORAL | 1 refills | Status: DC

## 2019-01-22 MED ORDER — CITALOPRAM HYDROBROMIDE 20 MG PO TABS: 20.0000 mg | ORAL_TABLET | Freq: Every day | ORAL | 0 refills | Status: DC

## 2019-01-22 MED ORDER — HYDROCODONE-ACETAMINOPHEN 10-325 MG PO TABS: ORAL_TABLET | ORAL | 0 refills | Status: DC

## 2019-01-22 MED ORDER — DICYCLOMINE HCL 20 MG PO TABS: ORAL_TABLET | ORAL | 0 refills | Status: DC

## 2019-01-22 MED ORDER — PANTOPRAZOLE SODIUM 40 MG PO TBEC: 40.0000 mg | DELAYED_RELEASE_TABLET | Freq: Every day | ORAL | 0 refills | Status: DC

## 2019-01-22 MED ORDER — CITALOPRAM HYDROBROMIDE 20 MG PO TABS: 20.0000 mg | ORAL_TABLET | Freq: Every day | ORAL | 1 refills | Status: DC

## 2019-01-22 MED ORDER — AMLODIPINE BESYLATE 2.5 MG PO TABS: 2.5000 mg | ORAL_TABLET | Freq: Every day | ORAL | 0 refills | Status: DC

## 2019-01-22 MED ORDER — COLESTIPOL HCL 1 G PO TABS: 1.0000 g | ORAL_TABLET | Freq: Two times a day (BID) | ORAL | 0 refills | Status: DC

## 2019-01-22 NOTE — Progress Notes (Signed)
Subjective:    Patient ID: [name], female    DOB: December 03, 1970, 48 y.o.   MRN: [id number]  HPImed check up and follow up on labs. Pt will be losing insurance this Friday. Starting a new job and will have insurance in 90 days after starting in December. Need all refills and requesting 90 day.  Last filled hydrocodone on the 13th and wants to discuss changing that med.   This patient was seen today for chronic pain  The medication list was reviewed and updated.   -Compliance with medication: Relates compliance with medicine  - Number patient states they take daily: Takes 2 or 3/day  -when was the last dose patient took?  Earlier yesterday  The patient was advised the importance of maintaining medication and not using illegal substances with these.  Here for refills and follow up  The patient was educated that we can provide 3 monthly scripts for their medication, it is their responsibility to follow the instructions.  Side effects or complications from medications: Denies side effects  Patient is aware that pain medications are meant to minimize the severity of the pain to allow their pain levels to improve to allow for better function. They are aware of that pain medications cannot totally remove their pain.  Due for UDT ( at least once per year) : We try to do this on a yearly basis Patient would like to have refills on this medicine      Virtual Visit via Telephone Note  I connected with [name] on 01/22/19 at 10:00 AM EST by telephone and verified that I am speaking with the correct person using two identifiers.  Location: Patient: home Provider: office   I discussed the limitations, risks, security and privacy concerns of performing an evaluation and management service by telephone and the availability of in person appointments. I also discussed with the patient that there may be a patient responsible charge related to this service. The patient expressed  understanding and agreed to proceed.   History of Present Illness:    Observations/Objective:   Assessment and Plan:   Follow Up Instructions:    I discussed the assessment and treatment plan with the patient. The patient was provided an opportunity to ask questions and all were answered. The patient agreed with the plan and demonstrated an understanding of the instructions.   The patient was advised to call back or seek an in-person evaluation if the symptoms worsen or if the condition fails to improve as anticipated.  I provided 15 minutes of non-face-to-face time during this encounter.  Patient also has several other issues that she would like to have refills on her medicine please see assessment and plan for full details     Review of Systems  Constitutional: Negative for activity change, appetite change and fatigue.  HENT: Negative for congestion and rhinorrhea.   Respiratory: Negative for cough and shortness of breath.   Cardiovascular: Negative for chest pain and leg swelling.  Gastrointestinal: Negative for abdominal pain and diarrhea.  Endocrine: Negative for polydipsia and polyphagia.  Skin: Negative for color change.  Neurological: Negative for dizziness and weakness.  Psychiatric/Behavioral: Negative for behavioral problems and confusion.       Objective:   Physical Exam  Today's visit was via telephone Physical exam was not possible for this visit       Assessment & Plan:  The patient was seen in followup for chronic pain. A review over at their current  pain status was discussed. Drug registry was checked. Prescriptions were given. Discussion was held regarding the importance of compliance with medication as well as pain medication contract.  Time for questions regarding pain management plan occurred. Importance of regular followup visits was discussed. Patient was informed that medication may cause drowsiness and should not be combined  with other  medications/alcohol or street drugs. Patient was cautioned that medication could cause drowsiness. If the patient feels medication is causing altered alertness then do not drive or operate dangerous equipment.  Pain prescriptions were sent in.  Patient has IBS uses Colestid as well as Bentyl.  Refill sent in  Hot flashes with menopause I do not recommend estrogens continue Celexa moods are doing okay  Patient relates blood pressure doing all right continue current medications  Patient acid blockers are helping continue these currently  Labs reviewed with patient no need to do further labs  Follow-up in 4 to 6 months

## 2019-01-31 ENCOUNTER — Ambulatory Visit: Payer: BC Managed Care – PPO | Admitting: Nurse Practitioner

## 2019-05-05 ENCOUNTER — Encounter: Payer: Self-pay | Admitting: Family Medicine

## 2019-05-05 ENCOUNTER — Ambulatory Visit (INDEPENDENT_AMBULATORY_CARE_PROVIDER_SITE_OTHER): Payer: 59 | Admitting: Family Medicine

## 2019-05-05 ENCOUNTER — Other Ambulatory Visit: Payer: Self-pay

## 2019-05-05 VITALS — BP 128/82 | Temp 98.2°F

## 2019-05-05 DIAGNOSIS — R519 Headache, unspecified: Secondary | ICD-10-CM

## 2019-05-05 MED ORDER — PREDNISONE 20 MG PO TABS: ORAL_TABLET | ORAL | 0 refills | Status: DC

## 2019-05-05 MED ORDER — TOPIRAMATE 50 MG PO TABS: 50.0000 mg | ORAL_TABLET | Freq: Two times a day (BID) | ORAL | 2 refills | Status: DC

## 2019-05-05 MED ORDER — TIZANIDINE HCL 4 MG PO TABS: 4.0000 mg | ORAL_TABLET | Freq: Three times a day (TID) | ORAL | 0 refills | Status: DC | PRN

## 2019-05-05 NOTE — Progress Notes (Signed)
   Subjective:    Patient ID: [name], female    DOB: 1970-12-01, 49 y.o.   MRN: [id number] Initially a phone visit but then patient brought to the office because of complexity HPI  Patient calls for bad headache and nausea that started yesterday. Patient states she had a similar headache in November. Patient laying in bed with lights off- vision was blurry at times. Patient relates headache nausea feels like her head is in a vice denies excessive stress denies any other particular troubles currently does not know how her blood pressure is doing.  She had similar episode in this past November Virtual Visit via Video Note  I connected with [name] on 05/05/19 at 10:00 AM EST by a video enabled telemedicine application and verified that I am speaking with the correct person using two identifiers.  Location: Patient: home Provider: office   I discussed the limitations of evaluation and management by telemedicine and the availability of in person appointments. The patient expressed understanding and agreed to proceed.  History of Present Illness:    Observations/Objective:   Assessment and Plan:   Follow Up Instructions:    I discussed the assessment and treatment plan with the patient. The patient was provided an opportunity to ask questions and all were answered. The patient agreed with the plan and demonstrated an understanding of the instructions.   The patient was advised to call back or seek an in-person evaluation if the symptoms worsen or if the condition fails to improve as anticipated.  I provided 25 minutes of non-face-to-face time during this encounter.     Review of Systems  Constitutional: Negative for activity change and appetite change.  HENT: Negative for congestion and rhinorrhea.   Respiratory: Negative for cough and shortness of breath.   Cardiovascular: Negative for chest pain and leg swelling.  Gastrointestinal: Negative for abdominal pain,  nausea and vomiting.  Skin: Negative for color change.  Neurological: Positive for headaches. Negative for dizziness and weakness.  Psychiatric/Behavioral: Negative for agitation and confusion.       Objective:   Physical Exam Vitals reviewed.  Constitutional:      General: She is not in acute distress. HENT:     Head: Normocephalic.  Cardiovascular:     Rate and Rhythm: Normal rate and regular rhythm.     Heart sounds: Normal heart sounds. No murmur.  Pulmonary:     Effort: Pulmonary effort is normal.     Breath sounds: Normal breath sounds.  Lymphadenopathy:     Cervical: No cervical adenopathy.  Neurological:     Mental Status: She is alert.  Psychiatric:        Behavior: Behavior normal.    At the office I believe that she is having more of a migraine related equivalent I do not find any evidence of meningitis  Given that she is having severe headaches that wake her up at night plus also give her nausea and intermittent vomiting plus also they seem to be getting more worse with more frequent recurrences I believe it is reasonable for the patient to have an MRI to rule out structural lesion MRI ordered     Assessment & Plan:  Frequent headaches Intractable currently Prednisone taper Refills on her pain medicine given Follow-up in April

## 2019-05-06 ENCOUNTER — Telehealth: Payer: Self-pay | Admitting: Family Medicine

## 2019-05-06 NOTE — Telephone Encounter (Signed)
Pt has some questions regarding her MRI and the process of having it scheduled and how she can schedule it.

## 2019-05-07 ENCOUNTER — Encounter: Payer: Self-pay | Admitting: Family Medicine

## 2019-05-07 ENCOUNTER — Telehealth: Payer: Self-pay | Admitting: Family Medicine

## 2019-05-07 NOTE — Telephone Encounter (Signed)
Please give pt work excuse for the rest of the week. May return to work on Monday 05/12/2019. Thank you

## 2019-05-07 NOTE — Telephone Encounter (Signed)
MRI scheduled & MyChart message sent to notify pt

## 2019-05-07 NOTE — Telephone Encounter (Signed)
Patient is requesting an extended work note due to not sleeping at night from 3/8-3/15. [phone]-Attn: Medical Dept Please advise

## 2019-05-07 NOTE — Telephone Encounter (Signed)
Tried to call pt, no answer & unable to leave a message due to voice mailbox is full

## 2019-05-07 NOTE — Telephone Encounter (Signed)
Nurses It could well be the prednisone causing the insomnia. Please make sure she gets a work note for this week with return to work next Monday Once she stops the prednisone she should be sleeping better Thanks-Dr. Lorin Picket  If ongoing troubles let us know

## 2019-05-08 ENCOUNTER — Other Ambulatory Visit: Payer: Self-pay | Admitting: Family Medicine

## 2019-05-08 ENCOUNTER — Other Ambulatory Visit: Payer: Self-pay | Admitting: *Deleted

## 2019-05-08 ENCOUNTER — Telehealth: Payer: Self-pay | Admitting: Family Medicine

## 2019-05-08 MED ORDER — AMLODIPINE BESYLATE 2.5 MG PO TABS: 2.5000 mg | ORAL_TABLET | Freq: Every day | ORAL | 0 refills | Status: DC

## 2019-05-08 MED ORDER — HYDROCODONE-ACETAMINOPHEN 10-325 MG PO TABS: ORAL_TABLET | ORAL | 0 refills | Status: DC

## 2019-05-08 MED ORDER — CITALOPRAM HYDROBROMIDE 20 MG PO TABS: 20.0000 mg | ORAL_TABLET | Freq: Every day | ORAL | 0 refills | Status: DC

## 2019-05-08 NOTE — Telephone Encounter (Signed)
Nurses I did send in the medication I would recommend a follow-up visit within 30 days so that we can do the next 3 scripts

## 2019-05-08 NOTE — Telephone Encounter (Signed)
Last pain management was 01/22/19

## 2019-05-08 NOTE — Telephone Encounter (Signed)
Nurses I sent into prescriptions this evening for hydrocodone this should cover her until her appointment later in April there is no need to move it up

## 2019-05-08 NOTE — Telephone Encounter (Signed)
Called pt and let her know celexa and amldipine was sent to [facility] and dr Lorin Picket would need to review Friday for hydrocodone. Pt would like a call back after med is sent to pharm.

## 2019-05-08 NOTE — Telephone Encounter (Signed)
Pt states she is out of her blood pressure med and is needing them today. Please advise. Thank you

## 2019-05-08 NOTE — Telephone Encounter (Signed)
Ok all but hydrocodone times one, send dr scott request re pain meds--

## 2019-05-09 ENCOUNTER — Ambulatory Visit: Payer: BC Managed Care – PPO | Admitting: Nurse Practitioner

## 2019-05-09 NOTE — Telephone Encounter (Signed)
Pt contacted and verbalized understanding.  

## 2019-05-09 NOTE — Telephone Encounter (Signed)
Please schedule visit and we do not need message back. All meds have been sent in.

## 2019-05-09 NOTE — Telephone Encounter (Signed)
Patient is wanting med check with 6 week follow up on 4/19.

## 2019-05-30 ENCOUNTER — Ambulatory Visit (HOSPITAL_COMMUNITY)
Admission: RE | Admit: 2019-05-30 | Discharge: 2019-05-30 | Disposition: A | Discharge status: Home / Self Care | Payer: 59 | Source: Ambulatory Visit | Attending: Family Medicine | Admitting: Family Medicine

## 2019-05-30 ENCOUNTER — Other Ambulatory Visit: Payer: Self-pay

## 2019-05-30 DIAGNOSIS — R519 Headache, unspecified: Secondary | ICD-10-CM | POA: Insufficient documentation

## 2019-06-03 ENCOUNTER — Ambulatory Visit (HOSPITAL_COMMUNITY): Admission: RE | Admit: 2019-06-03 | Payer: 59 | Source: Ambulatory Visit

## 2019-06-05 ENCOUNTER — Other Ambulatory Visit: Payer: Self-pay

## 2019-06-05 ENCOUNTER — Encounter: Payer: Self-pay | Admitting: Family Medicine

## 2019-06-05 ENCOUNTER — Ambulatory Visit: Payer: 59 | Admitting: Family Medicine

## 2019-06-05 VITALS — BP 126/80 | Temp 97.6°F | Wt 195.6 lb

## 2019-06-05 DIAGNOSIS — F411 Generalized anxiety disorder: Secondary | ICD-10-CM

## 2019-06-05 DIAGNOSIS — M722 Plantar fascial fibromatosis: Secondary | ICD-10-CM

## 2019-06-05 DIAGNOSIS — I10 Essential (primary) hypertension: Secondary | ICD-10-CM | POA: Diagnosis not present

## 2019-06-05 DIAGNOSIS — M545 Low back pain, unspecified: Secondary | ICD-10-CM

## 2019-06-05 DIAGNOSIS — Z79891 Long term (current) use of opiate analgesic: Secondary | ICD-10-CM

## 2019-06-05 DIAGNOSIS — G8929 Other chronic pain: Secondary | ICD-10-CM

## 2019-06-05 DIAGNOSIS — K219 Gastro-esophageal reflux disease without esophagitis: Secondary | ICD-10-CM | POA: Diagnosis not present

## 2019-06-05 DIAGNOSIS — R519 Headache, unspecified: Secondary | ICD-10-CM

## 2019-06-05 MED ORDER — DEXLANSOPRAZOLE 30 MG PO CPDR: 30.0000 mg | DELAYED_RELEASE_CAPSULE | Freq: Every day | ORAL | 5 refills | Status: DC

## 2019-06-05 MED ORDER — CITALOPRAM HYDROBROMIDE 20 MG PO TABS: 20.0000 mg | ORAL_TABLET | Freq: Every day | ORAL | 5 refills | Status: DC

## 2019-06-05 MED ORDER — AMLODIPINE BESYLATE 2.5 MG PO TABS: 2.5000 mg | ORAL_TABLET | Freq: Every day | ORAL | 5 refills | Status: DC

## 2019-06-05 MED ORDER — HYDROCODONE-ACETAMINOPHEN 10-325 MG PO TABS: ORAL_TABLET | ORAL | 0 refills | Status: DC

## 2019-06-05 MED ORDER — TOPIRAMATE 50 MG PO TABS: 50.0000 mg | ORAL_TABLET | Freq: Two times a day (BID) | ORAL | 5 refills | Status: DC

## 2019-06-05 MED ORDER — DICLOFENAC SODIUM 75 MG PO TBEC: 75.0000 mg | DELAYED_RELEASE_TABLET | Freq: Two times a day (BID) | ORAL | 1 refills | Status: DC

## 2019-06-05 NOTE — Progress Notes (Signed)
Subjective:    Patient ID: [name], female    DOB: 1970-06-30, 49 y.o.   MRN: [id number]  Headache  This is a chronic problem. The problem has been gradually improving. Pertinent negatives include no abdominal pain, coughing, dizziness, nausea, rhinorrhea, vomiting or weakness.  Pt states she is no longer having migraines but has had some small headaches. Topamax has helped.    This patient was seen today for chronic pain  The medication list was reviewed and updated.   -Compliance with medication: Hydrocodone 10-325 mg  - Number patient states they take daily: sometimes none; taking one tablet every 4-6 hours when off work  -when was the last dose patient took? yesterday  The patient was advised the importance of maintaining medication and not using illegal substances with these.  Here for refills and follow up  The patient was educated that we can provide 3 monthly scripts for their medication, it is their responsibility to follow the instructions.  Side effects or complications from medications: none  Patient is aware that pain medications are meant to minimize the severity of the pain to allow their pain levels to improve to allow for better function. They are aware of that pain medications cannot totally remove their pain.  Due for UDT ( at least once per year) :   Plantar fascitis in left heel has become worse. Pt has places insoles in shoes and doing stretching exercises along with pain med     Review of Systems  Constitutional: Negative for activity change and appetite change.  HENT: Negative for congestion and rhinorrhea.   Respiratory: Negative for cough and shortness of breath.   Cardiovascular: Negative for chest pain and leg swelling.  Gastrointestinal: Negative for abdominal pain, nausea and vomiting.  Musculoskeletal:       Left knee pain Left plantar fasciitis  Skin: Negative for color change.  Neurological: Positive for headaches. Negative for  dizziness and weakness.  Psychiatric/Behavioral: Negative for agitation and confusion.       Objective:   Physical Exam Vitals reviewed.  Constitutional:      General: She is not in acute distress. HENT:     Head: Normocephalic and atraumatic.  Eyes:     General:        Right eye: No discharge.        Left eye: No discharge.  Neck:     Trachea: No tracheal deviation.  Cardiovascular:     Rate and Rhythm: Normal rate and regular rhythm.     Heart sounds: Normal heart sounds. No murmur.  Pulmonary:     Effort: Pulmonary effort is normal. No respiratory distress.     Breath sounds: Normal breath sounds.  Lymphadenopathy:     Cervical: No cervical adenopathy.  Skin:    General: Skin is warm and dry.  Neurological:     Mental Status: She is alert.     Coordination: Coordination normal.  Psychiatric:        Behavior: Behavior normal.           Assessment & Plan:  The patient was seen in followup for chronic pain. A review over at their current pain status was discussed. Drug registry was checked. Prescriptions were given.  Regular follow-up recommended. Discussion was held regarding the importance of compliance with medication as well as pain medication contract.  Patient was informed that medication may cause drowsiness and should not be combined  with other medications/alcohol or street drugs. If the patient  feels medication is causing altered alertness then do not drive or operate dangerous equipment.  Her pain medicine takes the edge off pain she denies abusing it In addition to this the patient does take her pain medicine for low back as well as left knee pain She keeps her medicine in a safe spot She states without the pain medicine she would not be functional She denies drowsiness or feeling drugged with the medicine  1. HTN (hypertension), benign Blood pressure good control currently continue medications  2. Gastroesophageal reflux disease, unspecified  whether esophagitis present Reflux good control continue current medication  3. Chronic bilateral low back pain, unspecified whether sciatica present Pain management see above  4. Plantar fasciitis Plantar fasciitis anti-inflammatory for the next 2 to 3 weeks warning signs for GI bleed discussed stretches recommended if not doing better recommend injection  5. Encounter for long-term opiate analgesic use Please see above - ToxASSURE Select 13 (MW), Urine

## 2019-06-06 LAB — MED LIST ATTACHED SEPARATELY

## 2019-06-07 LAB — TOXASSURE SELECT 13 (MW), URINE

## 2019-06-07 LAB — SPECIMEN STATUS REPORT

## 2019-06-10 ENCOUNTER — Telehealth: Payer: Self-pay | Admitting: Family Medicine

## 2019-06-10 NOTE — Telephone Encounter (Signed)
PA attempted through Cover My Meds for Hydrocodone 10-325 mg. Await decision.

## 2019-06-12 NOTE — Telephone Encounter (Signed)
PA for Hydrocodone-Acetaminophen 10-325 mg tablets have been approved from 06/10/19-12/07/2019. Tried to call patient; voicemail is full unable to leave message. Will sent MyChart message

## 2019-06-12 NOTE — Telephone Encounter (Signed)
Pt returned call and verbalized understanding  

## 2019-06-16 ENCOUNTER — Ambulatory Visit: Payer: 59 | Admitting: Family Medicine

## 2019-07-02 ENCOUNTER — Other Ambulatory Visit: Payer: Self-pay | Admitting: *Deleted

## 2019-07-02 ENCOUNTER — Telehealth: Payer: Self-pay | Admitting: Family Medicine

## 2019-07-02 ENCOUNTER — Encounter: Payer: Self-pay | Admitting: Family Medicine

## 2019-07-02 DIAGNOSIS — K580 Irritable bowel syndrome with diarrhea: Secondary | ICD-10-CM

## 2019-07-02 NOTE — Telephone Encounter (Signed)
GI referral for IBS Dr Jena Gauss office

## 2019-07-02 NOTE — Telephone Encounter (Signed)
Referral in epic

## 2019-07-02 NOTE — Telephone Encounter (Signed)
May give patient a work note for the stated date

## 2019-07-02 NOTE — Telephone Encounter (Signed)
Pt would like a referral for Gastro to help with her IBS. She is having a flare up once or twice a month and it is causing her to miss work.

## 2019-07-03 ENCOUNTER — Encounter: Payer: Self-pay | Admitting: Family Medicine

## 2019-07-10 ENCOUNTER — Encounter: Payer: Self-pay | Admitting: Family Medicine

## 2019-08-01 ENCOUNTER — Encounter: Payer: Self-pay | Admitting: Family Medicine

## 2019-08-27 ENCOUNTER — Encounter: Payer: Self-pay | Admitting: Nurse Practitioner

## 2019-08-27 NOTE — Telephone Encounter (Signed)
Nurses patient may have a work note as requested for those 2 days  Also please pend pain medication prescription for refill on the ninth  Patient to keep her follow-up visit  Please send me pended medicine for review and signing thank you-Dr. Lorin Picket

## 2019-08-28 ENCOUNTER — Telehealth: Payer: Self-pay | Admitting: Family Medicine

## 2019-08-28 ENCOUNTER — Other Ambulatory Visit: Payer: Self-pay | Admitting: *Deleted

## 2019-08-28 ENCOUNTER — Encounter: Payer: Self-pay | Admitting: Nurse Practitioner

## 2019-08-28 ENCOUNTER — Encounter: Payer: Self-pay | Admitting: Family Medicine

## 2019-08-28 DIAGNOSIS — Z79891 Long term (current) use of opiate analgesic: Secondary | ICD-10-CM

## 2019-08-28 MED ORDER — HYDROCODONE-ACETAMINOPHEN 10-325 MG PO TABS: ORAL_TABLET | ORAL | 0 refills | Status: DC

## 2019-08-28 NOTE — Telephone Encounter (Signed)
Next refill due 7/9 and [place] and would like the one at [facility] canceled and new script sent to [facility] [address]. Med is pended if you agree and I will call [facility] to cancel if you agree.

## 2019-08-28 NOTE — Telephone Encounter (Signed)
I sent in the medication.  Hopefully the pharmacy will fill on that date.  Thank you cancel the one at [facility] thank you

## 2019-08-28 NOTE — Progress Notes (Signed)
Rx sent May send pt mychart message

## 2019-08-28 NOTE — Telephone Encounter (Signed)
Pt would like written rx for pain medication to take and have filled when [place] the beach.

## 2019-08-28 NOTE — Telephone Encounter (Signed)
Left message to return call to see when pt is going to beach and which pharm to send to. She has a script on file at [facility] to fill on 7/9 that would need to be canceled if dr scott approves it to be sent somewhere else.

## 2019-08-29 ENCOUNTER — Other Ambulatory Visit: Payer: Self-pay | Admitting: Family Medicine

## 2019-08-29 DIAGNOSIS — K219 Gastro-esophageal reflux disease without esophagitis: Secondary | ICD-10-CM

## 2019-08-29 NOTE — Telephone Encounter (Signed)
Pt contacted and verbalized understanding. Contacted [facility] and cancelled pain med script that is due 09/05/19  Pt states that she may need her Norvasc, Celexa, topamax and dexilant sent to pharmacy [place] also. [facility] is going to try and fill it early but if they can not, she will give [place] a call.

## 2019-09-12 ENCOUNTER — Ambulatory Visit: Payer: 59 | Admitting: Nurse Practitioner

## 2019-09-12 ENCOUNTER — Ambulatory Visit: Payer: 59 | Admitting: Physician Assistant

## 2019-09-12 ENCOUNTER — Encounter: Payer: Self-pay | Admitting: Physician Assistant

## 2019-09-12 ENCOUNTER — Other Ambulatory Visit: Payer: Self-pay

## 2019-09-12 VITALS — BP 132/82 | Wt 193.4 lb

## 2019-09-12 DIAGNOSIS — Z79891 Long term (current) use of opiate analgesic: Secondary | ICD-10-CM | POA: Diagnosis not present

## 2019-09-12 DIAGNOSIS — G8929 Other chronic pain: Secondary | ICD-10-CM

## 2019-09-12 DIAGNOSIS — G479 Sleep disorder, unspecified: Secondary | ICD-10-CM | POA: Diagnosis not present

## 2019-09-12 DIAGNOSIS — M545 Low back pain, unspecified: Secondary | ICD-10-CM

## 2019-09-12 NOTE — Progress Notes (Signed)
Subjective:    Patient ID: [name], female    DOB: 1970-07-19, 49 y.o.   MRN: [id number]  HPI  This patient was seen today for chronic pain  The medication list was reviewed and updated.   -Compliance with medication: yes  - Number patient states they take daily: 3  -when was the last dose patient took? today  The patient was advised the importance of maintaining medication and not using illegal substances with these.  Here for refills and follow up  The patient was educated that we can provide 3 monthly scripts for their medication, it is their responsibility to follow the instructions.  Side effects or complications from medications: none  Patient is aware that pain medications are meant to minimize the severity of the pain to allow their pain levels to improve to allow for better function. They are aware of that pain medications cannot totally remove their pain.  Due for UDT ( at least once per year) : 05/2019  Scale of 1 to 10 ( 1 is least 10 is most) Your pain level without the medicine: 8-9 Your pain level with medication 2  Scale 1 to 10 ( 1-helps very little, 10 helps very well) How well does your pain medication reduce your pain so you can function better through out the day? 8-9  Patient is currently working 12-hour days, 6 to 7 days a week.  Back pain is worse after prolonged standing and moving.  Has also had some slight swelling in both legs when she has been on her feet for many hours.  Pain is mainly in the left back/hip area.  Takes the medicine twice a day, tries to save the third dose only if needed.  Also having significant trouble sleeping.  Works night shift.  Gets off about 8 AM in the morning, after showering and getting something to eat she lays down around 1030 to 11:00 in the morning.  Averages about 3 hours total sleep per day.     Review of Systems     Objective:   Physical Exam NAD.  Alert, oriented.  Fatigued in appearance.  Dark  circles noted under both eyes.  Lungs clear.  Heart regular rate rhythm.       Assessment & Plan:   Problem List Items Addressed This Visit      Other   Chronic back pain - Primary   Relevant Medications   HYDROcodone-acetaminophen (NORCO) 10-325 MG tablet   traZODone (DESYREL) 50 MG tablet   Encounter for long-term opiate analgesic use   Relevant Medications   HYDROcodone-acetaminophen (NORCO) 10-325 MG tablet   Sleep disturbance     Meds ordered this encounter  Medications  . DISCONTD: HYDROcodone-acetaminophen (NORCO) 10-325 MG tablet    Sig: Take one tid prn pain    Dispense:  75 tablet    Refill:  0    Order Specific Question:   Supervising Provider    Answer:   Lilyan Punt A [[id number]]  . DISCONTD: HYDROcodone-acetaminophen (NORCO) 10-325 MG tablet    Sig: Take one tid prn pain    Dispense:  75 tablet    Refill:  0    May will 30 days from 09/15/19    Order Specific Question:   Supervising Provider    Answer:   Lilyan Punt A [[id number]]  . HYDROcodone-acetaminophen (NORCO) 10-325 MG tablet    Sig: Take one tid prn pain    Dispense:  75 tablet    Refill:  0    May will 60 days from 09/15/19    Order Specific Question:   Supervising Provider    Answer:   Lilyan Punt A [[id number]]  . traZODone (DESYREL) 50 MG tablet    Sig: Take 0.5-1 tablets (25-50 mg total) by mouth at bedtime as needed for sleep.    Dispense:  30 tablet    Refill:  2    Order Specific Question:   Supervising Provider    Answer:   Lilyan Punt A [[id number]]     Continue current pain regimen for chronic low back pain.  With patient working extra hours no changes at this time. Start trazodone half to 1 tab p.o. about 1 hour before sleep.  Discussed potential adverse effects.  DC med and call if any problems.  Also feel this may help some with her stress.  Because of her pain med, avoid other controlled substances at this point. Recommend wellness exam. Return in about 3 months (around 12/13/2019) for pain  management.

## 2019-09-12 NOTE — Patient Instructions (Signed)
Tdap vaccine

## 2019-09-15 ENCOUNTER — Encounter: Payer: Self-pay | Admitting: Nurse Practitioner

## 2019-09-15 DIAGNOSIS — G479 Sleep disorder, unspecified: Secondary | ICD-10-CM | POA: Insufficient documentation

## 2019-09-15 MED ORDER — HYDROCODONE-ACETAMINOPHEN 10-325 MG PO TABS: ORAL_TABLET | ORAL | 0 refills | Status: DC

## 2019-09-15 MED ORDER — TRAZODONE HCL 50 MG PO TABS: 25.0000 mg | ORAL_TABLET | Freq: Every evening | ORAL | 2 refills | Status: DC | PRN

## 2019-10-02 ENCOUNTER — Encounter: Payer: Self-pay | Admitting: Family Medicine

## 2019-10-03 ENCOUNTER — Encounter: Payer: Self-pay | Admitting: Family Medicine

## 2019-10-03 ENCOUNTER — Telehealth: Payer: Self-pay | Admitting: Family Medicine

## 2019-10-03 NOTE — Telephone Encounter (Signed)
So in that particular case we can give her a work note for the full week as requested Please inquire with the patient does not need to have any special statements? Please be specific on the dates it needs to include? Since she is seeing a specialist we do not have to see her this coming week

## 2019-10-03 NOTE — Telephone Encounter (Signed)
FYI-patient states seeing specialist on 8/11 for her IBS our office referred her their because it has gotten worst she states.

## 2019-10-03 NOTE — Telephone Encounter (Signed)
May have a work excuse for a full week as requested Please connect with patient to find out is that through all of next week and make the note accordingly  In order to help document this because FMLA's are considered a legal form-anytime I am agreeing to take a person out of work for a week I need to do a visit.  This either needs to be virtual or in person.  Thank you this visit can be next week

## 2019-10-05 ENCOUNTER — Other Ambulatory Visit: Payer: Self-pay | Admitting: Family Medicine

## 2019-10-05 DIAGNOSIS — I10 Essential (primary) hypertension: Secondary | ICD-10-CM

## 2019-10-07 ENCOUNTER — Other Ambulatory Visit: Payer: Self-pay | Admitting: Nurse Practitioner

## 2019-10-08 ENCOUNTER — Ambulatory Visit: Payer: 59 | Admitting: Physician Assistant

## 2019-10-08 ENCOUNTER — Encounter: Payer: Self-pay | Admitting: Physician Assistant

## 2019-10-08 ENCOUNTER — Other Ambulatory Visit (INDEPENDENT_AMBULATORY_CARE_PROVIDER_SITE_OTHER): Payer: 59

## 2019-10-08 VITALS — BP 118/80 | HR 77 | Ht 65.0 in | Wt 190.0 lb

## 2019-10-08 DIAGNOSIS — K580 Irritable bowel syndrome with diarrhea: Secondary | ICD-10-CM | POA: Diagnosis not present

## 2019-10-08 DIAGNOSIS — R197 Diarrhea, unspecified: Secondary | ICD-10-CM | POA: Diagnosis not present

## 2019-10-08 DIAGNOSIS — R109 Unspecified abdominal pain: Secondary | ICD-10-CM | POA: Diagnosis not present

## 2019-10-08 LAB — C-REACTIVE PROTEIN: CRP: <1 mg/dL (ref 0.5–20.0)

## 2019-10-08 LAB — CBC WITH DIFFERENTIAL/PLATELET
Basophils Absolute: 0.1 10*3/uL (ref 0.0–0.1)
Basophils Relative: 0.7 % (ref 0.0–3.0)
Eosinophils Absolute: 0.1 10*3/uL (ref 0.0–0.7)
Eosinophils Relative: 1.1 % (ref 0.0–5.0)
HCT: 43.2 % (ref 36.0–46.0)
Hemoglobin: 14.6 g/dL (ref 12.0–15.0)
Lymphocytes Relative: 19.9 % (ref 12.0–46.0)
Lymphs Abs: 1.6 10*3/uL (ref 0.7–4.0)
MCHC: 33.8 g/dL (ref 30.0–36.0)
MCV: 87.6 fl (ref 78.0–100.0)
Monocytes Absolute: 0.7 10*3/uL (ref 0.1–1.0)
Monocytes Relative: 8.3 % (ref 3.0–12.0)
Neutro Abs: 5.7 10*3/uL (ref 1.4–7.7)
Neutrophils Relative %: 70 % (ref 43.0–77.0)
Platelets: 151 10*3/uL (ref 150.0–400.0)
RBC: 4.93 Mil/uL (ref 3.87–5.11)
RDW: 13.4 % (ref 11.5–15.5)
WBC: 8.2 10*3/uL (ref 4.0–10.5)

## 2019-10-08 LAB — BASIC METABOLIC PANEL
BUN: 17 mg/dL (ref 6–23)
CO2: 22 mEq/L (ref 19–32)
Calcium: 9.3 mg/dL (ref 8.4–10.5)
Chloride: 108 mEq/L (ref 96–112)
Creatinine, Ser: 0.69 mg/dL (ref 0.40–1.20)
GFR: 89.74 mL/min (ref >60.00)
Glucose, Bld: 84 mg/dL (ref 70–99)
Potassium: 3.9 mEq/L (ref 3.5–5.1)
Sodium: 143 mEq/L (ref 135–145)

## 2019-10-08 LAB — SEDIMENTATION RATE: Sed Rate: 5 mm/hr (ref 0–30)

## 2019-10-08 LAB — TSH: TSH: 1.49 u[IU]/mL (ref 0.35–4.50)

## 2019-10-08 MED ORDER — DICYCLOMINE HCL 20 MG PO TABS: ORAL_TABLET | ORAL | 0 refills | Status: DC

## 2019-10-08 MED ORDER — DICYCLOMINE HCL 20 MG PO TABS: ORAL_TABLET | ORAL | 6 refills | Status: DC

## 2019-10-08 NOTE — Telephone Encounter (Signed)
Seen 09/12/19 for chronic pain

## 2019-10-08 NOTE — Progress Notes (Signed)
Subjective:    Patient ID: [name], female    DOB: 03-04-1970, 49 y.o.   MRN: [id number]  HPI [name] is a pleasant 49 year old white female, new to GI today and referred by Dr. Lilyan Punt for evaluation of IBS-D. Patient had previously undergone some evaluation through [facility] and was seen there in 2017.  She did undergo colonoscopy with Dr. Andrey Campanile fields on December 2017 which showed a redundant rectosigmoid colon, few diverticuli, internal hemorrhoids and no polyps.  She was recommended for 5-year interval follow-up due to family history of colon cancer in her father and brother. She had been given a trial of Bentyl around that time and says that it was helpful.  She has not had any Bentyl for some time.  On history she says that her diarrhea started sometime after her gallbladder was removed 5 to 6 years ago.  She was at some point started on Colestid which she will take up to twice daily.  However what she describes is episodes of diarrhea that may occur a couple of times per month.  In between these episodes she is fine and will have normal bowel movements.  She says generally her diarrhea is aggravated by stress and when she is stressed she will have abdominal cramping, pain and diarrhea.  She does not use the Colestid regularly but rather uses this as needed during "flares".  She will get constipated if she stays on the Colestid. She has not been doing well over the past couple of weeks.  She says she has been stressed in general as she has been working third shift over the past several months, cannot get her sleep habits adjusted.  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  Now over the past 2 weeks she is stressed about her daughter who is undergoing a miscarriage. She has not noted any melena or hematochezia.  Appetite has been okay, weight has been stable.  Over the past few days bowel movements  have been mixed between looser stools and formed stools. She is currently on Celexa 20 mg daily, takes Prevacid 30 mg daily for GERD, trazodone 50 mg as needed for sleep and Topamax 50 mg twice daily for migraines.  Review of Systems Pertinent positive and negative review of systems were noted in the above HPI section.  All other review of systems was otherwise negative.  Outpatient Encounter Medications as of 10/08/2019  Medication Sig  . amLODipine (NORVASC) 2.5 MG tablet TAKE 1 TABLET BY MOUTH EVERY DAY  . Aspirin-Salicylamide-Caffeine (BC HEADACHE PO) Take 1 packet by mouth as needed (headaches).   . citalopram (CELEXA) 20 MG tablet Take 1 tablet (20 mg total) by mouth daily.  . colestipol (COLESTID) 1 g tablet Take 1 tablet (1 g total) by mouth 2 (two) times daily.  Marland Kitchen DEXILANT 30 MG capsule TAKE 1 CAPSULE BY MOUTH DAILY  . diclofenac (VOLTAREN) 75 MG EC tablet Take 1 tablet (75 mg total) by mouth 2 (two) times daily.  Marland Kitchen dicyclomine (BENTYL) 20 MG tablet TAKE (1) TABLET BY MOUTH THREE TIMES DAILY AS NEEDED FOR ABDOMINAL CRAMPS AND DIARRHEA  . HYDROcodone-acetaminophen (NORCO) 10-325 MG tablet Take one tid prn pain  . ondansetron (ZOFRAN-ODT) 8 MG disintegrating tablet DISSOLVE 1 TABLET BY MOUTH EVERY 8 HOURS AS NEEDED FOR NAUSEA/VOMITING.  . topiramate (TOPAMAX) 50 MG tablet Take 1 tablet (50 mg total) by  mouth 2 (two) times daily.  . traZODone (DESYREL) 50 MG tablet Take 0.5-1 tablets (25-50 mg total) by mouth at bedtime as needed for sleep.  . [DISCONTINUED] dicyclomine (BENTYL) 20 MG tablet TAKE (1) TABLET BY MOUTH THREE TIMES DAILY AS NEEDED FOR ABDOMINAL CRAMPS.  . [DISCONTINUED] dicyclomine (BENTYL) 20 MG tablet TAKE (1) TABLET BY MOUTH THREE TIMES DAILY AS NEEDED FOR ABDOMINAL CRAMPS AND DIARRHEA   No facility-administered encounter medications on file as of 10/08/2019.   Allergies  Allergen Reactions  . Doxycycline Nausea And Vomiting    Patient states " I felt like I was dying".    . Penicillins Nausea And Vomiting    Has patient had a PCN reaction causing immediate rash, facial/tongue/throat swelling, SOB or lightheadedness with hypotension: unknown Has patient had a PCN reaction causing severe rash involving mucus membranes or skin necrosis: unknown Has patient had a PCN reaction that required hospitalization: unknown Has patient had a PCN reaction occurring within the last 10 years: yes If all of the above answers are "NO", then may proceed with Cephalosporin use.   . Sulfa Antibiotics Swelling  . Xanax [Alprazolam] Other (See Comments)    Increased in anger when taking the Xanax.    Patient Active Problem List   Diagnosis Date Noted  . Sleep disturbance 09/15/2019  . HTN (hypertension), benign 01/22/2019  . Depression, major, single episode, moderate (HCC) 03/21/2018  . Hot flashes 03/21/2018  . Chronic diarrhea 11/12/2015  . Irritable bowel syndrome with diarrhea 06/19/2015  . Arthritis of knee, degenerative 12/15/2014  . Anxiety as acute reaction to exceptional stress 11/28/2014  . Chondromalacia of right knee 11/28/2014  . Rebound headache 11/06/2014  . Stressful life event affecting family 11/06/2014  . Encounter for long-term opiate analgesic use 09/15/2014  . Tinea versicolor 09/15/2014  . Chronic back pain 06/16/2014  . Arthritis of both knees 06/16/2014  . DUB (dysfunctional uterine bleeding) 02/17/2013  . Family history of colon cancer 12/19/2012  . Dyspepsia 12/17/2012  . GERD (gastroesophageal reflux disease) 11/03/2012  . Analgesic overuse headache 11/03/2012  . Low back pain 09/09/2012  . Menorrhagia 09/09/2012  . Arthritis 07/24/2012   Social History   Socioeconomic History  . Marital status: Divorced    Spouse name: Not on file  . Number of children: Not on file  . Years of education: Not on file  . Highest education level: Not on file  Occupational History  . Not on file  Tobacco Use  . Smoking status: Current Every Day  Smoker    Packs/day: 1.00    Years: 24.00    Pack years: 24.00    Types: Cigarettes    Start date: 11/26/1983  . Smokeless tobacco: Never Used  . Tobacco comment: quit 3 times for a total of 8 years since 1985  Substance and Sexual Activity  . Alcohol use: No    Comment: socially just mixed drinks   . Drug use: No  . Sexual activity: Yes    Partners: Male    Birth control/protection: Surgical  Other Topics Concern  . Not on file  Social History Narrative  . Not on file   Social Determinants of Health   Financial Resource Strain:   . Difficulty of Paying Living Expenses:   Food Insecurity:   . Worried About Programme researcher, broadcasting/film/video in the Last Year:   . Barista in the Last Year:   Transportation Needs:   . Freight forwarder (Medical):   Marland Kitchen  Lack of Transportation (Non-Medical):   Physical Activity:   . Days of Exercise per Week:   . Minutes of Exercise per Session:   Stress:   . Feeling of Stress :   Social Connections:   . Frequency of Communication with Friends and Family:   . Frequency of Social Gatherings with Friends and Family:   . Attends Religious Services:   . Active Member of Clubs or Organizations:   . Attends Banker Meetings:   Marland Kitchen Marital Status:   Intimate Partner Violence:   . Fear of Current or Ex-Partner:   . Emotionally Abused:   Marland Kitchen Physically Abused:   . Sexually Abused:     Ms. [name] family history includes Arthritis in her mother; COPD in her mother; Cancer in her sister; Colon cancer in her brother and father; Colon polyps in her sister; Depression in her mother and sister; Diabetes in her maternal grandmother, mother, paternal grandmother, and sister; Heart attack in her brother; Hypertension in her mother; Mental illness in her mother and sister; Prostate cancer in her father; Stroke in her mother; Thyroid disease in her mother.      Objective:    Vitals:   10/08/19 0909  BP: 118/80  Pulse: 77    Physical Exam  Well-developed well-nourished WF  in no acute distress.  Height, Weight190, BMI 31.6  HEENT; nontraumatic normocephalic, EOMI, PE R LA, sclera anicteric.  Anxious and tearful Oropharynx;not done Neck; supple, no JVD Cardiovascular; regular rate and rhythm with S1-S2, no murmur rub or gallop Pulmonary; Clear bilaterally Abdomen; soft, no focal tenderness, nondistended, no palpable mass or hepatosplenomegaly, bowel sounds are active Rectal; not done today Skin; benign exam, no jaundice rash or appreciable lesions Extremities; no clubbing cyanosis or edema skin warm and dry Neuro/Psych; alert and oriented x4, grossly nonfocal mood and affect appropriate, anxious and tearful       Assessment & Plan:   #60 49 year old white female with history of IBS-D, who presents with recent exacerbation of symptoms, stress-induced. She may have been labeled as having bile salt induced diarrhea post cholecystectomy, however by history her symptoms are definitely intermittent with more episodic diarrhea which is atypical for bile salt induced diarrhea.  #2 anxiety/depression #3.  GERD-controlled on Prevacid 30 mg daily #4.  Hypertension #5.  Family history of colon cancer in patient's father and brother #6.  Colon cancer screening-up-to-date with negative colonoscopy save diverticulosis and redundant colon and December 2017 per Dr. Andrey Campanile fields #7.  Status post cholecystectomy  Plan; stop Colestid. CBC, sed rate, CRP, TSH. Start dicyclomine 10 mg p.o. every 6 hours as needed up to 3 times daily, advised she may want to take a dose prior to going to work. Patient requested a note for work to cover the next couple of days and this was given. Patient will be established with Dr. Myrtie Neither, she will follow-up as needed. She is indicated for follow-up colonoscopy in 2023, and will place order for recall.   Derak Schurman Oswald Hillock PA-C 10/08/2019   Cc: Babs Sciara, MD

## 2019-10-08 NOTE — Patient Instructions (Addendum)
If you are age 51 or older, your body mass index should be between 23-30. Your Body mass index is 31.62 kg/m. If this is out of the aforementioned range listed, please consider follow up with your Primary Care Provider.  If you are age 72 or younger, your body mass index should be between 19-25. Your Body mass index is 31.62 kg/m. If this is out of the aformentioned range listed, please consider follow up with your Primary Care Provider.   Your provider has requested that you go to the basement level for lab work before leaving today. Press "B" on the elevator. The lab is located at the first door on the left as you exit the elevator.  STOP Colestipol, you may continue as needed  START Dicyclomine 10 mg 3 times a day as needed for abdominal Cramping and diarrhea  Follow up as needed, pending your lab results.

## 2019-10-09 ENCOUNTER — Telehealth: Payer: Self-pay | Admitting: Family Medicine

## 2019-10-09 ENCOUNTER — Telehealth: Payer: Self-pay | Admitting: Physician Assistant

## 2019-10-09 NOTE — Telephone Encounter (Signed)
Patient brought in short-term disability form to be completed in your folder.

## 2019-10-09 NOTE — Progress Notes (Signed)
____________________________________________________________  Attending physician addendum:  Thank you for sending this case to me. I have reviewed the entire note, and the outlined plan seems appropriate.  Agree it sounds like IBS with stress trigger rather than bile acid diarrhea. Dicyclomine reportedly helped her in the past.   PCP assistance with counseling,etc for recent stressors may help as well.  Amada Jupiter, MD  ____________________________________________________________

## 2019-10-10 NOTE — Telephone Encounter (Signed)
Returned phone call and advised the patient that the paper work had not came. She had read the fax number she had used, I gave her the correct fax number. She advised that she would have the forms faxed right over.

## 2019-10-10 NOTE — Telephone Encounter (Signed)
Form was completed thank you 

## 2019-10-10 NOTE — Telephone Encounter (Signed)
Form was faxed over and copy mailed out to patient.

## 2019-10-10 NOTE — Telephone Encounter (Signed)
Form faxed in and copy mailed to patient

## 2019-10-13 NOTE — Telephone Encounter (Signed)
Patient's form has been received. Called to let the patient know and asked a few questions. Patient's form is to only cover the recent period of time she was out of work last week. She needs to have the form sent back in as soon as possible. 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

## 2019-10-17 ENCOUNTER — Other Ambulatory Visit: Payer: Self-pay | Admitting: Physician Assistant

## 2019-10-17 DIAGNOSIS — K580 Irritable bowel syndrome with diarrhea: Secondary | ICD-10-CM

## 2019-11-02 ENCOUNTER — Other Ambulatory Visit: Payer: Self-pay | Admitting: Physician Assistant

## 2019-11-02 DIAGNOSIS — K580 Irritable bowel syndrome with diarrhea: Secondary | ICD-10-CM

## 2019-11-05 ENCOUNTER — Encounter: Payer: Self-pay | Admitting: Nurse Practitioner

## 2019-11-11 ENCOUNTER — Other Ambulatory Visit: Payer: Self-pay | Admitting: Physician Assistant

## 2019-11-11 DIAGNOSIS — K580 Irritable bowel syndrome with diarrhea: Secondary | ICD-10-CM

## 2019-11-20 ENCOUNTER — Other Ambulatory Visit: Payer: Self-pay | Admitting: Physician Assistant

## 2019-11-20 DIAGNOSIS — K580 Irritable bowel syndrome with diarrhea: Secondary | ICD-10-CM

## 2019-11-24 ENCOUNTER — Other Ambulatory Visit: Payer: Self-pay | Admitting: Family Medicine

## 2019-11-24 DIAGNOSIS — G8929 Other chronic pain: Secondary | ICD-10-CM

## 2019-11-24 DIAGNOSIS — F411 Generalized anxiety disorder: Secondary | ICD-10-CM

## 2019-11-24 DIAGNOSIS — R519 Headache, unspecified: Secondary | ICD-10-CM

## 2019-11-24 DIAGNOSIS — K219 Gastro-esophageal reflux disease without esophagitis: Secondary | ICD-10-CM

## 2019-11-24 NOTE — Telephone Encounter (Signed)
90-day on each

## 2019-12-09 ENCOUNTER — Other Ambulatory Visit: Payer: Self-pay | Admitting: Physician Assistant

## 2019-12-09 DIAGNOSIS — K580 Irritable bowel syndrome with diarrhea: Secondary | ICD-10-CM

## 2019-12-12 ENCOUNTER — Encounter: Payer: Self-pay | Admitting: Nurse Practitioner

## 2019-12-12 ENCOUNTER — Ambulatory Visit (INDEPENDENT_AMBULATORY_CARE_PROVIDER_SITE_OTHER): Payer: Self-pay | Admitting: Nurse Practitioner

## 2019-12-12 VITALS — BP 134/84 | HR 111 | Temp 97.4°F | Ht 65.0 in | Wt 191.8 lb

## 2019-12-12 DIAGNOSIS — Z79891 Long term (current) use of opiate analgesic: Secondary | ICD-10-CM

## 2019-12-12 DIAGNOSIS — G8929 Other chronic pain: Secondary | ICD-10-CM

## 2019-12-12 DIAGNOSIS — F17210 Nicotine dependence, cigarettes, uncomplicated: Secondary | ICD-10-CM

## 2019-12-12 DIAGNOSIS — M170 Bilateral primary osteoarthritis of knee: Secondary | ICD-10-CM

## 2019-12-12 DIAGNOSIS — G43709 Chronic migraine without aura, not intractable, without status migrainosus: Secondary | ICD-10-CM

## 2019-12-12 DIAGNOSIS — M545 Low back pain, unspecified: Secondary | ICD-10-CM

## 2019-12-12 MED ORDER — ONDANSETRON 8 MG PO TBDP: ORAL_TABLET | ORAL | 0 refills | Status: DC

## 2019-12-12 MED ORDER — HYDROCODONE-ACETAMINOPHEN 10-325 MG PO TABS: ORAL_TABLET | ORAL | 0 refills | Status: DC

## 2019-12-12 MED ORDER — PROPRANOLOL HCL ER 80 MG PO CP24: 80.0000 mg | ORAL_CAPSULE | Freq: Every day | ORAL | 2 refills | Status: DC

## 2019-12-12 NOTE — Progress Notes (Signed)
Subjective:    Subjective   Patient ID: [name], female    DOB: [birth year], 49 y.o.   MRN: [id number]  HPI  This patient was seen today for chronic pain  The medication list was reviewed and updated.   -Compliance with medication: Hydrocodone   - Number patient states they take daily: 2  -when was the last dose patient took? This morning   The patient was advised the importance of maintaining medication and not using illegal substances with these.  Here for refills and follow up  The patient was educated that we can provide 3 monthly scripts for their medication, it is their responsibility to follow the instructions.  Side effects or complications from medications: none  Patient is aware that pain medications are meant to minimize the severity of the pain to allow their pain levels to improve to allow for better function. They are aware of that pain medications cannot totally remove their pain.  Due for UDT ( at least once per year) : completed in April   Scale of 1 to 10 ( 1 is least 10 is most) Your pain level without the medicine: 8 Your pain level with medication 4  Scale 1 to 10 ( 1-helps very little, 10 helps very well) How well does your pain medication reduce your pain so you can function better through out the day? 9  Patient reports to visit with current complaints of left leg pain 4/10 with swelling x 1 month. History of arthritis and right knee replacement. She noted that the swelling worsens with long periods of sitting or car rides. She has tried rest and elevation which she states does not seem to alleviate the swelling. The swelling is constant and is present upon awakening each morning. Recently, she has experienced a change in her job situation and is no longer standing for long periods of the day. She is a current smoker of 1 PPD. Patient also reports chronic headaches which have been gradually improving. Pt states she is experiencing  less intense headaches once a month now and contributes this to recent reduction in job stress and medication. She feels her current regimen of Topamax has helped but reports undesirable side effects including foggy thinking and taste disturbances. She would like to complete her monthly supply of the Topamax prescription and discuss alternatives to her migraine therapy.  No change in migraine symptomatology.         Review of Systems  Constitutional: Negative for chills, fever, malaise/fatigue and weight loss.  HENT: Negative for congestion, hearing loss and tinnitus.   Eyes: Negative for blurred vision, double vision and photophobia.  Respiratory: Negative for cough and shortness of breath.   Cardiovascular: Positive for leg swelling. Negative for chest pain and palpitations.       Left Lower Leg/ Ankle  Gastrointestinal: Positive for diarrhea. Negative for abdominal pain, blood in stool, constipation, heartburn and nausea.       Diarrhea with stressful encounters  Musculoskeletal: Positive for back pain and joint pain. Negative for falls.       Left knee and left ankle pain; denies recent injury or trauma  Neurological: Positive for headaches. Negative for dizziness, tingling, loss of consciousness and weakness.  Psychiatric/Behavioral: Negative for substance abuse and suicidal ideas. The patient does not have insomnia.      Office Visit from 12/12/2019 in [place]  PHQ-9 Total Score 6     GAD 7 : Generalized Anxiety Score 12/12/2019 03/21/2018  06/06/2017  Nervous, Anxious, on Edge '1 3 2  ' Control/stop worrying 0 1 2  Worry too much - different things '1 1 2  ' Trouble relaxing '2 2 3  ' Restless 1 0 3  Easily annoyed or irritable '2 3 3  ' Afraid - awful might happen 0 0 0  Total GAD 7 Score '7 10 15  ' Anxiety Difficulty Somewhat difficult Somewhat difficult Extremely difficult       Objective  Vitals:   12/12/19 0845  BP: 134/84  Pulse: (!) 111  Temp: (!) 97.4 F  (36.3 C)  SpO2: 98%   Physical Exam Constitutional:      General: She is not in acute distress.    Appearance: Normal appearance. She is not ill-appearing.  Cardiovascular:     Rate and Rhythm: Normal rate and regular rhythm.     Pulses: Normal pulses.     Heart sounds: No murmur heard.   Pulmonary:     Effort: Pulmonary effort is normal. No respiratory distress.     Breath sounds: Normal breath sounds. No wheezing, rhonchi or rales.  Musculoskeletal:        General: No swelling or signs of injury.     Right knee: No erythema or crepitus. Normal range of motion. No tenderness.     Left knee: Crepitus present. No swelling or erythema. Normal range of motion. No tenderness.     Right lower leg: No tenderness. 1+ Edema (Non-pitting) present.     Left lower leg: No tenderness. 1+ Edema (Non-Pitting) present.     Right ankle: Swelling present. No deformity. No tenderness. Normal range of motion. Normal pulse.     Left ankle: Swelling present. No deformity. No tenderness. Normal range of motion. Normal pulse.     Right foot: Normal range of motion. No swelling, deformity or tenderness. Normal pulse.     Left foot: Normal range of motion. No swelling, deformity or tenderness. Normal pulse.     Comments: Foot arches normal and stable bilaterally without deviation; Calf circumference symmetrical bilaterally; 43 cm bilaterally.  negative Homans sign test bilaterally.  Feet:     Right foot:     Skin integrity: No erythema or warmth.     Left foot:     Skin integrity: No erythema or warmth.  Skin:    General: Skin is warm and dry.     Capillary Refill: Capillary refill takes less than 2 seconds.  Neurological:     Mental Status: She is alert and oriented to person, place, and time.     Motor: No weakness.     Gait: Gait normal.  Psychiatric:        Mood and Affect: Mood normal.        Behavior: Behavior normal.      Recent Results (from the past 2160 hour(s))  C-reactive protein      Status: None   Collection Time: 10/08/19 10:03 AM  Result Value Ref Range   CRP <1.0 0.5 - 20.0 mg/dL  Sed Rate (ESR)     Status: None   Collection Time: 10/08/19 10:03 AM  Result Value Ref Range   Sed Rate 5 0 - 30 mm/hr  TSH     Status: None   Collection Time: 10/08/19 10:03 AM  Result Value Ref Range   TSH 1.49 0.35 - 4.50 uIU/mL  Basic metabolic panel     Status: None   Collection Time: 10/08/19 10:03 AM  Result Value Ref Range   Sodium 143  135 - 145 mEq/L   Potassium 3.9 3.5 - 5.1 mEq/L   Chloride 108 96 - 112 mEq/L   CO2 22 19 - 32 mEq/L   Glucose, Bld 84 70 - 99 mg/dL   BUN 17 6 - 23 mg/dL   Creatinine, Ser 0.69 0.40 - 1.20 mg/dL   GFR 89.74 >60.00 mL/min   Calcium 9.3 8.4 - 10.5 mg/dL  CBC w/Diff     Status: None   Collection Time: 10/08/19 10:03 AM  Result Value Ref Range   WBC 8.2 4.0 - 10.5 K/uL   RBC 4.93 3.87 - 5.11 Mil/uL   Hemoglobin 14.6 12.0 - 15.0 g/dL   HCT 43.2 36 - 46 %   MCV 87.6 78.0 - 100.0 fl   MCHC 33.8 30.0 - 36.0 g/dL   RDW 13.4 11.5 - 15.5 %   Platelets 151.0 150 - 400 K/uL   Neutrophils Relative % 70.0 43 - 77 %   Lymphocytes Relative 19.9 12 - 46 %   Monocytes Relative 8.3 3 - 12 %   Eosinophils Relative 1.1 0 - 5 %   Basophils Relative 0.7 0 - 3 %   Neutro Abs 5.7 1.4 - 7.7 K/uL   Lymphs Abs 1.6 0.7 - 4.0 K/uL   Monocytes Absolute 0.7 0.1 - 1.0 K/uL   Eosinophils Absolute 0.1 0.0 - 0.7 K/uL   Basophils Absolute 0.1 0.0 - 0.1 K/uL       Assessment & Plan:   Problem List Items Addressed This Visit      Cardiovascular and Mediastinum   Chronic migraine without aura without status migrainosus, not intractable   Relevant Medications   HYDROcodone-acetaminophen (NORCO) 10-325 MG tablet   propranolol ER (INDERAL LA) 80 MG 24 hr capsule     Musculoskeletal and Integument   Arthritis of both knees (Chronic)   Relevant Medications   HYDROcodone-acetaminophen (NORCO) 10-325 MG tablet     Other   Chronic back pain   Relevant  Medications   HYDROcodone-acetaminophen (NORCO) 10-325 MG tablet   Cigarette smoker   Encounter for long-term opiate analgesic use - Primary   Relevant Medications   HYDROcodone-acetaminophen (NORCO) 10-325 MG tablet      Left lower leg pain: -No history of previously documented DVT. Wells criteria=low risk group for DVT -discussed length of current symptoms and risk factors for DVT -Tobacco counseling provided with techniques to assist in reducing current consumption by 50% up to and including cessation. Patient voiced desire to quit at this time. -continue current pain regimen -rest and ice as needed -ensure proper and supportive footwear  Chronic Back Pain: -Continue Hydrocodone  Headaches: -Stop Topamax at end of supply -Start Inderal; educated on side effects of Beta-Blocker use.  Return in about 3 months (around 03/13/2020) for Pain management; physical. Call back sooner if any problems.  30 minutes was spent with the patient including previsit chart review, time spent with patient, discussion of health issues, review of data including medical record, and documentation of the visit.

## 2019-12-12 NOTE — Progress Notes (Addendum)
   Subjective:    Patient ID: [name], female    DOB: 05/11/1970, 49 y.o.   MRN: [id number]  HPI  This patient was seen today for chronic pain  The medication list was reviewed and updated.   -Compliance with medication: Hydrocodone   - Number patient states they take daily: 2  -when was the last dose patient took? This morning   The patient was advised the importance of maintaining medication and not using illegal substances with these.  Here for refills and follow up  The patient was educated that we can provide 3 monthly scripts for their medication, it is their responsibility to follow the instructions.  Side effects or complications from medications: none  Patient is aware that pain medications are meant to minimize the severity of the pain to allow their pain levels to improve to allow for better function. They are aware of that pain medications cannot totally remove their pain.  Due for UDT ( at least once per year) : completed in April   Scale of 1 to 10 ( 1 is least 10 is most) Your pain level without the medicine: 8 Your pain level with medication 4  Scale 1 to 10 ( 1-helps very little, 10 helps very well) How well does your pain medication reduce your pain so you can function better through out the day? 9 No longer insured. Has started cleaning houses for income.       Physical Exam        Assessment & Plan:  Encouraged patient to look at [facility] on the insurance exchange.

## 2019-12-12 NOTE — Patient Instructions (Signed)
[facility]

## 2019-12-14 ENCOUNTER — Encounter: Payer: Self-pay | Admitting: Nurse Practitioner

## 2019-12-14 DIAGNOSIS — F17210 Nicotine dependence, cigarettes, uncomplicated: Secondary | ICD-10-CM | POA: Insufficient documentation

## 2019-12-21 ENCOUNTER — Other Ambulatory Visit: Payer: Self-pay | Admitting: Physician Assistant

## 2019-12-21 DIAGNOSIS — K580 Irritable bowel syndrome with diarrhea: Secondary | ICD-10-CM

## 2020-01-04 ENCOUNTER — Other Ambulatory Visit: Payer: Self-pay | Admitting: Family Medicine

## 2020-01-04 DIAGNOSIS — F411 Generalized anxiety disorder: Secondary | ICD-10-CM

## 2020-01-08 ENCOUNTER — Other Ambulatory Visit: Payer: Self-pay | Admitting: Physician Assistant

## 2020-01-08 DIAGNOSIS — K580 Irritable bowel syndrome with diarrhea: Secondary | ICD-10-CM

## 2020-01-21 ENCOUNTER — Other Ambulatory Visit: Payer: Self-pay | Admitting: Physician Assistant

## 2020-01-21 DIAGNOSIS — K580 Irritable bowel syndrome with diarrhea: Secondary | ICD-10-CM

## 2020-01-31 ENCOUNTER — Other Ambulatory Visit: Payer: Self-pay | Admitting: Nurse Practitioner

## 2020-01-31 ENCOUNTER — Other Ambulatory Visit: Payer: Self-pay | Admitting: Family Medicine

## 2020-01-31 DIAGNOSIS — I10 Essential (primary) hypertension: Secondary | ICD-10-CM

## 2020-02-16 ENCOUNTER — Telehealth: Payer: Self-pay

## 2020-02-16 DIAGNOSIS — E785 Hyperlipidemia, unspecified: Secondary | ICD-10-CM

## 2020-02-16 DIAGNOSIS — Z13220 Encounter for screening for lipoid disorders: Secondary | ICD-10-CM

## 2020-02-16 NOTE — Telephone Encounter (Signed)
Last labs 10/08/19: CBC, Met 7, TSH, Sed Rate, CRP

## 2020-02-16 NOTE — Telephone Encounter (Signed)
Blood work ordered in Epic. Patient notified. 

## 2020-02-16 NOTE — Telephone Encounter (Signed)
Due for lipid Dx hyperlipidemia

## 2020-02-16 NOTE — Telephone Encounter (Signed)
Pt scheduled a Med Check 12/30 will she need blood work done?  Pt call back [phone]

## 2020-02-26 ENCOUNTER — Encounter: Payer: Self-pay | Admitting: Family Medicine

## 2020-02-26 ENCOUNTER — Other Ambulatory Visit: Payer: Self-pay

## 2020-02-26 ENCOUNTER — Ambulatory Visit (INDEPENDENT_AMBULATORY_CARE_PROVIDER_SITE_OTHER): Payer: Self-pay | Admitting: Family Medicine

## 2020-02-26 VITALS — BP 134/88 | HR 98 | Temp 98.4°F | Ht 65.0 in | Wt 194.0 lb

## 2020-02-26 DIAGNOSIS — Z79891 Long term (current) use of opiate analgesic: Secondary | ICD-10-CM

## 2020-02-26 MED ORDER — HYDROCODONE-ACETAMINOPHEN 10-325 MG PO TABS: ORAL_TABLET | ORAL | 0 refills | Status: DC

## 2020-02-26 NOTE — Patient Instructions (Signed)
Pain Medicine Instructions You may need pain medicine after an injury or illness. Two common types of pain medicine are:  Opioid pain medicine. These may be called opioids.  Non-opioid pain medicine. This includes NSAIDs. It is important to follow your doctor's instructions when you are taking pain medicine. Doing this can keep yourself and others safe. How can pain medicine affect me? Pain medicine may not make all of your pain go away. It should make you comfortable enough to:  Move.  Breathe.  Do normal activities. Opioids can cause side effects, such as:  Trouble pooping (constipation).  Feeling sick to your stomach (nausea).  Throwing up (vomiting).  Feeling very sleepy.  Confusion.  Taking the medicine for nonmedical reasons even though taking it hurts your health and well-being (opioid use disorder).  Trouble breathing (respiratory depression). Taking opioids for longer than 3 days raises your risk of these side effects. Taking opioids for a long time can affect how well you can do daily tasks. Taking them for a long time also puts you at risk for:  Car crashes.  Depression.  Suicide.  Heart attack.  Taking too much of the medicine (overdose). This can lead to death. What should I do to stay safe while taking pain medicine? Take your medicine as told  Take pain medicine exactly as told by your doctor. Take it only when you need it.  Write down the times when you take your pain medicine. Look at the times before you take your next dose.  Take other over-the-counter or prescription medicines only as told by your doctor. ? If your pain medicine has acetaminophen in it, do not take any other acetaminophen while you are taking this medicine. Too much can damage the liver.  Get pain medicine prescriptions from only one doctor. Avoid certain activities While you are taking prescription pain medicine, and for 8 hours after your last dose:  Do not drive.  Do  not use machinery.  Do not use power tools.  Do not sign legal documents.  Do not drink alcohol.  Do not take sleeping pills.  Do not take care of children by yourself.  Do not do any activities that involve climbing or being in high places.  Do not go into any body of water unless there is an adult nearby who can watch you and help you if needed. This includes: ? Lakes. ? Rivers. ? Oceans. ? Spas. ? Swimming pools.  Keep others safe  Store your medicine as told by your doctor. Keep it where children and pets cannot reach it.  Do not share your pain medicine with anyone.  Do not save any leftover pills. If you have leftover pills, you can: ? Bring them to a take-back program. ? Bring them to a pharmacy that has a drug disposal container. ? Throw them in the trash. Check the medicine label or package insert to see if it is safe to throw it out. If it is safe, take the medicine out of the container. Mix it with something that makes it unusable, such as pet waste. Then put the medicine in the trash. General instructions  Talk with your doctor about other ways to manage your pain.  If you have trouble pooping: ? Drink enough fluid to keep your pee (urine) pale yellow. ? Use a poop (stool) softener as told by your doctor. ? Eat more fruits and vegetables.  Keep all follow-up visits as told by your doctor. This is important. Contact a   doctor if:  Your medicine is not helping with your pain.  You have a rash.  You feel depressed. Get help right away if: Seek medical care right away if you are taking pain medicines and you (or people close to you) notice any of the following:  Trouble breathing.  Breathing that is shorter than normal.  Breathing that is more shallow than normal.  Confusion.  Sleepiness.  Trouble staying awake.  Feeling sick to your stomach.  Throwing up.  Your skin or lips turning pale or bluish in color.  Tongue swelling. If you ever  feel like you may hurt yourself or others, or have thoughts about taking your own life, get help right away. Go to your nearest emergency department or call:  Your local emergency services (911 in the [place]  A suicide crisis helpline, such as the National Suicide Prevention Lifeline at 1-800-273-8255. This is open 24 hours a day. Summary  Take your pain medicine exactly as told by your doctor.  Pain medicine can help lower your pain. It may also cause side effects.  Talk with your doctor about other ways to manage your pain.  Follow your doctor's instructions about how to take your pain medicine and keep others safe. Ask what activities you should avoid while taking pain medicine. This information is not intended to replace advice given to you by your health care provider. Make sure you discuss any questions you have with your health care provider. Document Revised: 01/26/2017 Document Reviewed: 09/25/2016 Elsevier Patient Education  2020 [employer]  

## 2020-02-26 NOTE — Progress Notes (Signed)
Patient ID: [name], female    DOB: Oct 16, 1970, 50 y.o.   MRN: [id number]   Chief Complaint  Patient presents with  . Pain Management   Subjective:  CC: long-term pain medication  managment  This is a chronic problem.  Presents today for long-term opioid use medication management.  Reports that this chronic pain is usually from low back pain that radiates down her legs, however recently she took a fall and landed on both knees.  Reports that she has had to increase her pain medication due to the pain in her knees, she had a knee replacement on the right.  She reports that she has never had back surgery, never had her low back pain evaluated by a specialist.  She has been having back trouble since her first C-section with her first set of twins.  She denies fever, chills, chest pain, shortness of breath.    This patient was seen today for chronic pain  The medication list was reviewed and updated.   -Compliance with medication: one half to one tablet daily. But fell a couple of weeks ago and hurt knees and has been taking 2 or 2 and a half a day. Usually takes for back pain   - Number patient states they take daily: ususally one half to one tablet   -when was the last dose patient took? One this morning  The patient was advised the importance of maintaining medication and not using illegal substances with these.  Here for refills and follow up  The patient was educated that we can provide 3 monthly scripts for their medication, it is their responsibility to follow the instructions.  Side effects or complications from medications: none  Patient is aware that pain medications are meant to minimize the severity of the pain to allow their pain levels to improve to allow for better function. They are aware of that pain medications cannot totally remove their pain.  Due for UDT ( at least once per year) : last one 06/05/19  Scale of 1 to 10 ( 1 is least 10 is most) Your pain level  without the medicine: can go to 6 or 8  Your pain level with medication: 1 or 2 Scale 1 to 10 ( 1-helps very little, 10 helps very well) How well does your pain medication reduce your pain so you can function better through out the day? 1 or 2   Pt states she has not done cholesterol profile because she does not have insurance.        Medical History [name] has a past medical history of Anxiety, Arthritis, Chronic back pain, Chronic knee pain, Family history of anesthesia complication, GERD (gastroesophageal reflux disease), Headache, HTN (hypertension), benign (01/22/2019), IBS (irritable bowel syndrome), and Menorrhagia (09/09/2012).   Outpatient Encounter Medications as of 02/26/2020  Medication Sig  . amLODipine (NORVASC) 2.5 MG tablet TAKE 1 TABLET BY MOUTH EVERY DAY  . Aspirin-Salicylamide-Caffeine (BC HEADACHE PO) Take 1 packet by mouth as needed (headaches).   . citalopram (CELEXA) 20 MG tablet TAKE 1 TABLET BY MOUTH EVERY DAY  . dicyclomine (BENTYL) 20 MG tablet TAKE (1) TABLET BY MOUTH THREE TIMES DAILY AS NEEDED FOR ABDOMINAL CRAMPS AND DIARRHEA  . HYDROcodone-acetaminophen (NORCO) 10-325 MG tablet May take one tablet by mouth TID prn pain  . HYDROcodone-acetaminophen (NORCO) 10-325 MG tablet May take one tablet by mouth TID prn pain  . ondansetron (ZOFRAN-ODT) 8 MG disintegrating tablet DISSOLVE 1 TABLET BY MOUTH EVERY  8 HOURS AS NEEDED FOR NAUSEA/VOMITING.  . propranolol ER (INDERAL LA) 80 MG 24 hr capsule TAKE 1 CAPSULE (80 MG TOTAL) BY MOUTH DAILY. FOR MIGRAINE PREVENTION  . traZODone (DESYREL) 50 MG tablet TAKE 0.5-1 TABLETS (25-50 MG TOTAL) BY MOUTH AT BEDTIME AS NEEDED FOR SLEEP.  . [DISCONTINUED] HYDROcodone-acetaminophen (NORCO) 10-325 MG tablet Take one tid prn pain  . HYDROcodone-acetaminophen (NORCO) 10-325 MG tablet Take one tid prn pain  . [DISCONTINUED] colestipol (COLESTID) 1 g tablet Take 1 tablet (1 g total) by mouth 2 (two) times daily. (Patient not taking:  Reported on 12/12/2019)  . [DISCONTINUED] DEXILANT 30 MG capsule TAKE 1 CAPSULE BY MOUTH EVERY DAY (Patient not taking: Reported on 12/12/2019)  . [DISCONTINUED] diclofenac (VOLTAREN) 75 MG EC tablet Take 1 tablet (75 mg total) by mouth 2 (two) times daily.   No facility-administered encounter medications on file as of 02/26/2020.     Review of Systems  Constitutional: Negative for chills and fever.  HENT: Negative for congestion and ear pain.   Respiratory: Negative for shortness of breath.   Cardiovascular: Negative for chest pain.  Gastrointestinal: Negative for abdominal pain.  Musculoskeletal: Positive for joint swelling.       Knee pain, fell on both knees and had knee (right) replaced in hr 40's. Having increased pain due to this fall.   Long-term low back pain for many years. Pain radiates down legs.   Neurological: Negative for weakness and numbness.     Vitals BP 134/88   Pulse 98   Temp 98.4 F (36.9 C)   Ht 5\' 5"  (1.651 m)   Wt 194 lb (88 kg)   SpO2 100%   BMI 32.28 kg/m   Objective:   Physical Exam Vitals reviewed.  Constitutional:      Appearance: Normal appearance.  Cardiovascular:     Rate and Rhythm: Normal rate and regular rhythm.     Heart sounds: Normal heart sounds.  Pulmonary:     Effort: Pulmonary effort is normal.     Breath sounds: Normal breath sounds.  Musculoskeletal:        General: Swelling present.     Right lower leg: No edema.     Left lower leg: No edema.     Comments: on both knees (this is not her usual reason for pain med).   Skin:    General: Skin is warm and dry.  Neurological:     General: No focal deficit present.     Mental Status: She is alert.  Psychiatric:        Behavior: Behavior normal.      Assessment and Plan   1. Long-term current use of opiate analgesic  2. Encounter for long-term opiate analgesic use - HYDROcodone-acetaminophen (NORCO) 10-325 MG tablet; Take one tid prn pain  Dispense: 75  tablet; Refill: 0 - HYDROcodone-acetaminophen (NORCO) 10-325 MG tablet; May take one tablet by mouth TID prn pain  Dispense: 75 tablet; Refill: 0 - HYDROcodone-acetaminophen (NORCO) 10-325 MG tablet; May take one tablet by mouth TID prn pain  Dispense: 75 tablet; Refill: 0   Reports she has taken extra pain medications due to the fall she sustained on her knees.  Right knee history of knee replacement, she reports that that knee did swell initially.  Medications refilled given according to long-term use of opioids guidelines.  Agrees with plan of care discussed today. Understands warning signs to seek further care: Chest pain, shortness of breath, any significant change in health  status. Understands to follow-up in 3 months for opioid medication management, sooner for other issues.   Dorena Bodo, FNP-C 02/26/2020

## 2020-04-02 ENCOUNTER — Other Ambulatory Visit: Payer: Self-pay | Admitting: Family Medicine

## 2020-04-02 DIAGNOSIS — I10 Essential (primary) hypertension: Secondary | ICD-10-CM

## 2020-04-03 ENCOUNTER — Other Ambulatory Visit: Payer: Self-pay | Admitting: Family Medicine

## 2020-04-03 DIAGNOSIS — F411 Generalized anxiety disorder: Secondary | ICD-10-CM

## 2020-04-05 NOTE — Telephone Encounter (Signed)
Last  visit 12/30 for pain management

## 2020-05-14 ENCOUNTER — Other Ambulatory Visit: Payer: Self-pay

## 2020-05-14 ENCOUNTER — Encounter: Payer: Self-pay | Admitting: Nurse Practitioner

## 2020-05-14 ENCOUNTER — Ambulatory Visit (INDEPENDENT_AMBULATORY_CARE_PROVIDER_SITE_OTHER): Payer: Self-pay | Admitting: Nurse Practitioner

## 2020-05-14 VITALS — BP 122/80 | HR 95 | Temp 98.1°F | Ht 65.0 in | Wt 197.0 lb

## 2020-05-14 DIAGNOSIS — M2550 Pain in unspecified joint: Secondary | ICD-10-CM

## 2020-05-14 DIAGNOSIS — Z79891 Long term (current) use of opiate analgesic: Secondary | ICD-10-CM

## 2020-05-14 MED ORDER — HYDROCODONE-ACETAMINOPHEN 10-325 MG PO TABS: ORAL_TABLET | ORAL | 0 refills | Status: DC

## 2020-05-14 MED ORDER — PROPRANOLOL HCL 40 MG PO TABS: 40.0000 mg | ORAL_TABLET | Freq: Two times a day (BID) | ORAL | 2 refills | Status: DC

## 2020-05-14 NOTE — Progress Notes (Signed)
Subjective:    Patient ID: [name], female    DOB: 10/07/1970, 50 y.o.   MRN: [id number]  HPI   The patient is here for chronic pain management.  She has increased her use of norco 10-325 tabs from approximately 1/2 a tab daily to 2-3 tabs a day (was prescribed tid prn).  She had a flu like illness in January and has had increased joint pain since then.  Joint pain is present in all joints, especially shoulders and wrists, not just knees and hips as in the past.   Pain is at an 8/10 but goes down to a 2/10 with norco.  She sometimes substitutes the midday dose with advil and this does help; as well as epsom salt baths, heating pads, and a foot massager.  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  She does not currently have health insurance.  Tried to get insurance on the exchange but too expensive due to pre-existing conditions.  No erythema but slight swelling in different joints at times. No rashes.   She is unable to afford propanol LA due to loss of health insurance and is still having chronic headaches daily. Would like to try less expensive form.        This patient was seen today for chronic pain  The medication list was reviewed and updated.   -Compliance with medication: takes 2 -3 a day   - Number patient states they take daily: 2 -3   -when was the last dose patient took? today  The patient was advised the importance of maintaining medication and not using illegal substances with these.  Here for refills and follow up  The patient was educated that we can provide 3 monthly scripts for their medication, it is their responsibility to follow the instructions.  Side effects or complications from medications: none  Patient is aware that pain medications are meant to minimize the severity of the pain to allow their pain levels to improve to allow for better function.  They are aware of that pain medications cannot totally remove their pain.  Due for UDT ( at least once per year) : last one 06/05/19; hold today since she will be paying for labs; plan this for next pain management visit.   Scale of 1 to 10 ( 1 is least 10 is most) Your pain level without the medicine: VARIES 3 -8  Your pain level with medication: 0-2  Scale 1 to 10 ( 1-helps very little, 10 helps very well) How well does your pain medication reduce your pain so you can function better through out the day? 0-2   Review of Systems  Constitutional:       Subjective fevers, chills, fatigue, back in January for approx 2 weeks.   Cardiovascular:       Pt reports chronic LLE swelling BIL which she states her due to her knee issues.   Gastrointestinal: Negative for abdominal pain, constipation and diarrhea.  Genitourinary: Negative for difficulty urinating.  Musculoskeletal: Positive for arthralgias and back pain. Negative for joint swelling.  Skin: Negative for rash.  Neurological: Positive for headaches.       Objective:   Physical Exam Vitals and nursing note reviewed.  Constitutional:      General: She is not in acute distress. Cardiovascular:     Rate and Rhythm: Normal rate and regular  rhythm.     Heart sounds: Normal heart sounds.  Pulmonary:     Effort: Pulmonary effort is normal.     Breath sounds: Normal breath sounds.  Musculoskeletal:        General: No swelling or deformity.     Right lower leg: No edema.     Left lower leg: No edema.  Skin:    Findings: No rash.  Neurological:     Mental Status: She is alert and oriented to person, place, and time.  Psychiatric:        Mood and Affect: Mood normal.        Behavior: Behavior normal.        Thought Content: Thought content normal.        Judgment: Judgment normal.    Vitals:   05/14/20 1444  BP: 122/80  Pulse: 95  Temp: 98.1 F (36.7 C)  SpO2: 99%           Assessment & Plan:   Problem List  Items Addressed This Visit      Other   Encounter for long-term opiate analgesic use - Primary   Relevant Medications   HYDROcodone-acetaminophen (NORCO) 10-325 MG tablet    Other Visit Diagnoses    Arthralgia, unspecified joint       Relevant Orders   ANA (Completed)   Rheumatoid Factor (Completed)   Sed Rate (ESR) (Completed)     Meds ordered this encounter  Medications  . propranolol (INDERAL) 40 MG tablet    Sig: Take 1 tablet (40 mg total) by mouth 2 (two) times daily.    Dispense:  60 tablet    Refill:  2    Order Specific Question:   Supervising Provider    Answer:   Sallee Lange A [id number]  . DISCONTD: HYDROcodone-acetaminophen (NORCO) 10-325 MG tablet    Sig: Take one tid prn pain    Dispense:  75 tablet    Refill:  0    May fill 30 days from 05/02/20.    Order Specific Question:   Supervising Provider    Answer:   Sallee Lange A [[id number]]  . DISCONTD: HYDROcodone-acetaminophen (NORCO) 10-325 MG tablet    Sig: Take one tid prn pain    Dispense:  75 tablet    Refill:  0    May fill 60 days from 05/02/20.    Order Specific Question:   Supervising Provider    Answer:   Sallee Lange A [[id number]]  . HYDROcodone-acetaminophen (NORCO) 10-325 MG tablet    Sig: Take one tid prn pain    Dispense:  75 tablet    Refill:  0    May fill 90 days from 05/02/20.    Order Specific Question:   Supervising Provider    Answer:   Sallee Lange A [[id number]]    Labs pending to screen for RA and lupus. Patient will see if she can afford these. Switch to less expensive Propranolol BID for migraines.  Continue current pain med regimen but limit to the prescribed number per month. May need referral to rheumatology if symptoms persist and she can afford referral.  Plan UDT at next pain management visit. Hold on PE, colonoscopy (last 2017; due this year) and LDCT due to finances.  Return in about 3 months (around 08/14/2020) for Pain management.

## 2020-05-14 NOTE — Progress Notes (Signed)
   Subjective:    Patient ID: [name], female    DOB: Jun 26, 1970, 50 y.o.   MRN: [id number]  HPI This patient was seen today for chronic pain  The medication list was reviewed and updated.   -Compliance with medication: takes 2 -3 a day   - Number patient states they take daily: 2 -3   -when was the last dose patient took? today  The patient was advised the importance of maintaining medication and not using illegal substances with these.  Here for refills and follow up  The patient was educated that we can provide 3 monthly scripts for their medication, it is their responsibility to follow the instructions.  Side effects or complications from medications: none  Patient is aware that pain medications are meant to minimize the severity of the pain to allow their pain levels to improve to allow for better function. They are aware of that pain medications cannot totally remove their pain.  Due for UDT ( at least once per year) : last one 06/05/19  Scale of 1 to 10 ( 1 is least 10 is most) Your pain level without the medicine: VARIES 3 -8  Your pain level with medication: 0-2  Scale 1 to 10 ( 1-helps very little, 10 helps very well) How well does your pain medication reduce your pain so you can function better through out the day? 0-2        Review of Systems     Objective:   Physical Exam        Assessment & Plan:

## 2020-05-15 ENCOUNTER — Encounter: Payer: Self-pay | Admitting: Nurse Practitioner

## 2020-05-18 LAB — SEDIMENTATION RATE: Sed Rate: 2 mm/hr (ref 0–40)

## 2020-05-18 LAB — RHEUMATOID FACTOR: Rheumatoid fact SerPl-aCnc: <10 IU/mL (ref <14.0)

## 2020-05-18 LAB — ANA: ANA Titer 1: NEGATIVE

## 2020-06-29 ENCOUNTER — Other Ambulatory Visit: Payer: Self-pay | Admitting: Family Medicine

## 2020-06-29 DIAGNOSIS — I10 Essential (primary) hypertension: Secondary | ICD-10-CM

## 2020-07-26 ENCOUNTER — Other Ambulatory Visit: Payer: Self-pay | Admitting: Physician Assistant

## 2020-07-26 DIAGNOSIS — K580 Irritable bowel syndrome with diarrhea: Secondary | ICD-10-CM

## 2020-08-18 ENCOUNTER — Other Ambulatory Visit: Payer: Self-pay | Admitting: Nurse Practitioner

## 2020-08-26 ENCOUNTER — Ambulatory Visit: Payer: Self-pay | Admitting: Nurse Practitioner

## 2020-09-01 ENCOUNTER — Other Ambulatory Visit: Payer: Self-pay

## 2020-09-01 ENCOUNTER — Encounter: Payer: Self-pay | Admitting: Family Medicine

## 2020-09-01 ENCOUNTER — Ambulatory Visit (INDEPENDENT_AMBULATORY_CARE_PROVIDER_SITE_OTHER): Payer: 59 | Admitting: Family Medicine

## 2020-09-01 VITALS — BP 122/68 | HR 84 | Temp 98.6°F | Ht 65.0 in | Wt 198.0 lb

## 2020-09-01 DIAGNOSIS — I10 Essential (primary) hypertension: Secondary | ICD-10-CM

## 2020-09-01 DIAGNOSIS — E785 Hyperlipidemia, unspecified: Secondary | ICD-10-CM

## 2020-09-01 DIAGNOSIS — R519 Headache, unspecified: Secondary | ICD-10-CM | POA: Diagnosis not present

## 2020-09-01 DIAGNOSIS — G894 Chronic pain syndrome: Secondary | ICD-10-CM | POA: Diagnosis not present

## 2020-09-01 DIAGNOSIS — M2550 Pain in unspecified joint: Secondary | ICD-10-CM

## 2020-09-01 DIAGNOSIS — Z79891 Long term (current) use of opiate analgesic: Secondary | ICD-10-CM

## 2020-09-01 DIAGNOSIS — R5383 Other fatigue: Secondary | ICD-10-CM | POA: Diagnosis not present

## 2020-09-01 MED ORDER — HYDROCODONE-ACETAMINOPHEN 10-325 MG PO TABS: ORAL_TABLET | ORAL | 0 refills | Status: DC

## 2020-09-01 NOTE — Progress Notes (Signed)
   Subjective:    Patient ID: [name], female    DOB: 09/23/1970, 50 y.o.   MRN: [id number]  HPI This patient was seen today for chronic pain  The medication list was reviewed and updated.  Location of Pain for which the patient has been treated with regarding narcotics: knees and back but now pain in all joints.   Onset of this pain: years   -Compliance with medication: takes as prescribed  - Number patient states they take daily: up to 2 a day  -when was the last dose patient took? One week ago  The patient was advised the importance of maintaining medication and not using illegal substances with these.  Here for refills and follow up  The patient was educated that we can provide 3 monthly scripts for their medication, it is their responsibility to follow the instructions.  Side effects or complications from medications: none  Patient is aware that pain medications are meant to minimize the severity of the pain to allow their pain levels to improve to allow for better function. They are aware of that pain medications cannot totally remove their pain.  Due for UDT ( at least once per year) : last one 06/05/19. Has not taken any in one week  Scale of 1 to 10 ( 1 is least 10 is most) Your pain level without the medicine:  up to 8 or 9 on a bad day Your pain level with medication   Scale 1 to 10 ( 1-helps very little, 10 helps very well) How well does your pain medication reduce your pain so you can function better through out the day? 1 -2   Quality of the pain: Burning throbbing pain  Persistence of the pain: Present all the time  Modifying factors: Worse with activity  Needs refill on zofran and dexilant. Pt went off of meds because she did not have insurance. Now has insurance and would like to start back on it.       Review of Systems     Objective:   Physical Exam General-in no acute distress Eyes-no discharge Lungs-respiratory rate normal, CTA CV-no  murmurs,RRR Extremities skin warm dry no edema Neuro grossly normal Behavior normal, alert        Assessment & Plan:  GERD-Dxilant 60 does a good job we will go ahead with 1 daily if she has ongoing troubles she will let us know  The patient was seen in followup for chronic pain. A review over at their current pain status was discussed. Drug registry was checked. Prescriptions were given.  Regular follow-up recommended. Discussion was held regarding the importance of compliance with medication as well as pain medication contract.  Patient was informed that medication may cause drowsiness and should not be combined  with other medications/alcohol or street drugs. If the patient feels medication is causing altered alertness then do not drive or operate dangerous equipment. Drug registry checked patient states prescriptions do help 3 scripts were sent in  Nausea medicine sent in per patient request  Patient encouraged to stay away from [facility] powders  Moods overall doing well continue citalopram  Blood pressure doing well continue amlodipine Follow-up 3 months

## 2020-09-02 LAB — BASIC METABOLIC PANEL
BUN/Creatinine Ratio: 22 (ref 9–23)
BUN: 16 mg/dL (ref 6–24)
CO2: 20 mmol/L (ref 20–29)
Calcium: 10.5 mg/dL — ABNORMAL HIGH (ref 8.7–10.2)
Chloride: 109 mmol/L — ABNORMAL HIGH (ref 96–106)
Creatinine, Ser: 0.72 mg/dL (ref 0.57–1.00)
Glucose: 88 mg/dL (ref 65–99)
Potassium: 4 mmol/L (ref 3.5–5.2)
Sodium: 146 mmol/L — ABNORMAL HIGH (ref 134–144)
eGFR: 101 mL/min/{1.73_m2} (ref >59)

## 2020-09-02 LAB — HEPATIC FUNCTION PANEL
ALT: 15 IU/L (ref 0–32)
AST: 11 IU/L (ref 0–40)
Albumin: 4.7 g/dL (ref 3.8–4.9)
Alkaline Phosphatase: 93 IU/L (ref 44–121)
Bilirubin Total: <0.2 mg/dL (ref 0.0–1.2)
Bilirubin, Direct: <0.1 mg/dL (ref 0.00–0.40)
Total Protein: 6.7 g/dL (ref 6.0–8.5)

## 2020-09-02 LAB — LIPID PANEL
Chol/HDL Ratio: 3.1 ratio (ref 0.0–4.4)
Cholesterol, Total: 148 mg/dL (ref 100–199)
HDL: 48 mg/dL (ref >39)
LDL Chol Calc (NIH): 76 mg/dL (ref 0–99)
Triglycerides: 134 mg/dL (ref 0–149)
VLDL Cholesterol Cal: 24 mg/dL (ref 5–40)

## 2020-09-02 LAB — TSH: TSH: 1.45 u[IU]/mL (ref 0.450–4.500)

## 2020-09-02 MED ORDER — DEXLANSOPRAZOLE 60 MG PO CPDR: 60.0000 mg | DELAYED_RELEASE_CAPSULE | Freq: Every day | ORAL | 5 refills | Status: DC

## 2020-09-02 MED ORDER — ONDANSETRON 8 MG PO TBDP: ORAL_TABLET | ORAL | 3 refills | Status: DC

## 2020-09-03 ENCOUNTER — Other Ambulatory Visit (HOSPITAL_COMMUNITY): Payer: Self-pay | Admitting: Family Medicine

## 2020-09-03 ENCOUNTER — Other Ambulatory Visit: Payer: Self-pay | Admitting: Family Medicine

## 2020-09-03 DIAGNOSIS — Z1231 Encounter for screening mammogram for malignant neoplasm of breast: Secondary | ICD-10-CM

## 2020-09-03 NOTE — Telephone Encounter (Signed)
09/03/20-PA attempted; await decision

## 2020-09-06 ENCOUNTER — Other Ambulatory Visit: Payer: Self-pay

## 2020-09-06 ENCOUNTER — Ambulatory Visit (HOSPITAL_COMMUNITY)
Admission: RE | Admit: 2020-09-06 | Discharge: 2020-09-06 | Disposition: A | Discharge status: Home / Self Care | Payer: Medicaid Other | Source: Ambulatory Visit | Attending: Family Medicine | Admitting: Family Medicine

## 2020-09-06 ENCOUNTER — Other Ambulatory Visit: Payer: Self-pay | Admitting: Family Medicine

## 2020-09-06 ENCOUNTER — Telehealth: Payer: Self-pay | Admitting: Family Medicine

## 2020-09-06 DIAGNOSIS — Z1321 Encounter for screening for nutritional disorder: Secondary | ICD-10-CM

## 2020-09-06 DIAGNOSIS — Z1231 Encounter for screening mammogram for malignant neoplasm of breast: Secondary | ICD-10-CM

## 2020-09-06 NOTE — Telephone Encounter (Signed)
Attempted PA; message came up that nurse didn't understand. Contacted insurance and representative states that no PA is required since pt picked up 7 day supply on 09/01/20. Further fills will not need PA. Pt contacted and verbalized understanding.

## 2020-09-06 NOTE — Telephone Encounter (Signed)
[facility] is needing a [facility] for HYDROcodone-acetaminophen (NORCO) 10-325 MG tablet.   [facility] are [facility]. Pt is out of the medication and needs a PA sent in    [id number] [place] [place] - [address] AT [facility]

## 2020-09-07 LAB — PTH, INTACT AND CALCIUM
Calcium: 9.7 mg/dL (ref 8.7–10.2)
PTH: 25 pg/mL (ref 15–65)

## 2020-09-07 LAB — CALCIUM, IONIZED: Calcium, Ion: 5.3 mg/dL (ref 4.5–5.6)

## 2020-09-07 LAB — VITAMIN D 25 HYDROXY (VIT D DEFICIENCY, FRACTURES): Vit D, 25-Hydroxy: 30.8 ng/mL (ref 30.0–100.0)

## 2020-09-09 ENCOUNTER — Other Ambulatory Visit: Payer: Self-pay | Admitting: Family Medicine

## 2020-09-09 ENCOUNTER — Encounter: Payer: Self-pay | Admitting: Family Medicine

## 2020-09-09 DIAGNOSIS — Z79891 Long term (current) use of opiate analgesic: Secondary | ICD-10-CM

## 2020-09-09 NOTE — Telephone Encounter (Signed)
May do a prescription for the hydrocodone, #64, 1 3 times daily as needed, fill date September 10, 2020, please pend and send back to me thank you

## 2020-09-09 NOTE — Telephone Encounter (Signed)
Does insurance company cover any specific medication?  Did they give [place] some names to go on? If not it will be necessary to explain to the patient what is going on I would recommend trying pantoprazole 40 mg 1 daily first #30 with 5 refills, this is considered a good PPI If that is not helping over the course of the next few weeks we will try something different

## 2020-09-09 NOTE — Telephone Encounter (Signed)
Patient wanting to know why she could only get 7 of her pain management and to need an a different medication than dexilant  60 mg  called in for her insurance to cover.[facility]

## 2020-09-09 NOTE — Telephone Encounter (Signed)
Pharmacy states on 7/6 when the patient was filling her hydrocodone they only had 21 pill in the pharmacy and she didn't want to get elsewhere so they filled that but it voids the rest of the script. They have a new shipment in but need a script for the rest of the pills to get her to her 10/01/20 regular fill  Patient's insurance does not cover Dexilant and an alternative (step therapy) requested - PPI

## 2020-09-10 MED ORDER — HYDROCODONE-ACETAMINOPHEN 10-325 MG PO TABS: ORAL_TABLET | ORAL | 0 refills | Status: DC

## 2020-09-10 MED ORDER — PANTOPRAZOLE SODIUM 40 MG PO TBEC: 40.0000 mg | DELAYED_RELEASE_TABLET | Freq: Every day | ORAL | 3 refills | Status: DC

## 2020-09-10 NOTE — Telephone Encounter (Signed)
Patient is aware and will check with pharmacy after lunch

## 2020-09-10 NOTE — Telephone Encounter (Signed)
Patient notified that the additional hydrocodone script will be sent in today and wants to make sure the new stomach med is sent today as well- has made multiple request  [facility] 

## 2020-09-10 NOTE — Telephone Encounter (Signed)
Patient notified that the additional hydrocodone script will be sent in today and wants to make sure the new stomach med is sent today as well- has made multiple request  [facility]

## 2020-09-10 NOTE — Telephone Encounter (Signed)
Protonix was sent in as well as hydrocodone thank you

## 2020-09-10 NOTE — Telephone Encounter (Signed)
Hydrocodone was sent in Protonix was also sent in This is in place of the Dexilant per insurance stipulations

## 2020-09-26 ENCOUNTER — Other Ambulatory Visit: Payer: Self-pay | Admitting: Family Medicine

## 2020-09-26 DIAGNOSIS — I10 Essential (primary) hypertension: Secondary | ICD-10-CM

## 2020-10-18 ENCOUNTER — Ambulatory Visit (INDEPENDENT_AMBULATORY_CARE_PROVIDER_SITE_OTHER): Payer: 59 | Admitting: Adult Health

## 2020-10-18 ENCOUNTER — Encounter: Payer: Self-pay | Admitting: Adult Health

## 2020-10-18 ENCOUNTER — Other Ambulatory Visit (HOSPITAL_COMMUNITY)
Admission: RE | Admit: 2020-10-18 | Discharge: 2020-10-18 | Disposition: A | Discharge status: Home / Self Care | Payer: 59 | Source: Ambulatory Visit | Attending: Adult Health | Admitting: Adult Health

## 2020-10-18 ENCOUNTER — Other Ambulatory Visit: Payer: Self-pay

## 2020-10-18 VITALS — BP 137/92 | HR 81 | Ht 65.0 in | Wt 200.4 lb

## 2020-10-18 DIAGNOSIS — Z01419 Encounter for gynecological examination (general) (routine) without abnormal findings: Secondary | ICD-10-CM | POA: Insufficient documentation

## 2020-10-18 DIAGNOSIS — R232 Flushing: Secondary | ICD-10-CM

## 2020-10-18 DIAGNOSIS — R61 Generalized hyperhidrosis: Secondary | ICD-10-CM | POA: Insufficient documentation

## 2020-10-18 DIAGNOSIS — R5383 Other fatigue: Secondary | ICD-10-CM | POA: Diagnosis not present

## 2020-10-18 DIAGNOSIS — Z1211 Encounter for screening for malignant neoplasm of colon: Secondary | ICD-10-CM | POA: Insufficient documentation

## 2020-10-18 DIAGNOSIS — Z780 Asymptomatic menopausal state: Secondary | ICD-10-CM | POA: Insufficient documentation

## 2020-10-18 LAB — HEMOCCULT GUIAC POC 1CARD (OFFICE): Fecal Occult Blood, POC: NEGATIVE

## 2020-10-18 MED ORDER — PROGESTERONE 200 MG PO CAPS: ORAL_CAPSULE | ORAL | 12 refills | Status: DC

## 2020-10-18 MED ORDER — ESTRADIOL 1 MG PO TABS: 1.0000 mg | ORAL_TABLET | Freq: Every day | ORAL | 11 refills | Status: DC

## 2020-10-18 NOTE — Patient Instructions (Signed)
Menopause and Hormone Replacement Therapy Menopause is a normal time of life when menstrual periods stop completely and the ovaries stop producing the female hormones estrogen and progesterone. Low levels of these hormones can affect your health and cause symptoms. Hormonereplacement therapy (HRT) can relieve some of those symptoms. HRT is the use of artificial (synthetic) hormones to replace hormones that your body has stopped producing because youhave reached menopause. Types of HRT  HRT may consist of the synthetic hormones estrogen and progestin, or it may consist of estrogen-only therapy. You and your health care provider will decidewhich form of HRT is best for you. If you choose to be on HRT and you have a uterus, estrogen and progestin are usually prescribed. Estrogen-only therapy is used for women who do not have auterus. Possible options for taking HRT include: Pills. Patches. Gels. Sprays. Vaginal cream. Vaginal rings. Vaginal inserts. The amount of hormones that you take and how long you take them varies according to your health. It is important to: Begin HRT with the lowest possible dosage. Stop HRT as soon as your health care provider tells you to stop. Work with your health care provider so that you feel informed and comfortable with your decisions. Tell a health care provider about: Any allergies you have. Whether you have had blood clots or know of any risk factors you may have for blood clots. Whether you or family members have had cancer, especially cancer of the breasts, ovaries, or uterus. Any surgeries you have had. All medicines you are taking, including vitamins, herbs, eye drops, creams, and over-the-counter medicines. Whether you are pregnant or may be pregnant. Any medical conditions you have. What are the benefits? HRT can reduce the frequency and severity of menopausal symptoms. Benefits of HRT vary according to the kind of symptoms that you have, how severe  they are, and your overall health. HRT may help to improve the following symptoms of menopause: Hot flashes and night sweats. These are sudden feelings of heat that spread over the face and body. The skin may turn red, like a blush. Night sweats are hot flashes that happen while you are sleeping or trying to sleep. Bone loss (osteoporosis). The body loses calcium more quickly after menopause, causing the bones to become weaker. This can increase the risk for bone breaks (fractures). Vaginal dryness. The lining of the vagina can become thin and dry, which can cause pain during sex or cause infection, burning, or itching. Urinary tract infections. Urinary incontinence. This is the inability to control when you urinate. Irritability. Short-term memory problems. What are the risks? Risks of HRT vary depending on your individual health and medical history. Risks of HRT also depend on whether you receive both estrogen and progestin or you receive estrogen only. HRT may increase the risk of: Spotting. This is when a small amount of blood leaks from the vagina unexpectedly. Endometrial cancer. This cancer is in the lining of the uterus (endometrium). Breast cancer. Increased density of breast tissue. This can make it harder to find breast cancer on a breast X-ray (mammogram). Stroke. Heart disease. Blood clots. Gallbladder disease or liver disease. Risks of HRT can increase if you have any of the following conditions: Endometrial cancer. Liver disease. Heart disease. Breast cancer. History of blood clots. History of stroke. Follow these instructions at home: Pap tests Have Pap tests done as often as told by your health care provider. A Pap test is sometimes called a Pap smear. It is a screening test that   is used to check for signs of cancer of the cervix and vagina. A Pap test can also identify the presence of infection or precancerous changes. Pap tests may be done: Every 3 years, starting at  age 41. Every 5 years, starting after age 59, in combination with testing for human papillomavirus (HPV). More often or less often depending on other medical conditions you have, your age, and other risk factors. It is up to you to get the results of your Pap test. Ask your health care provider, or the department that is doing the test, when your results will be ready. General instructions Take over-the-counter and prescription medicines only as told by your health care provider. Do not use any products that contain nicotine or tobacco. These products include cigarettes, chewing tobacco, and vaping devices, such as e-cigarettes. If you need help quitting, ask your health care provider. Get mammograms, pelvic exams, and medical checkups as often as told by your health care provider. Keep all follow-up visits. This is important. Contact a health care provider if you have: Pain or swelling in your legs. Lumps or changes in your breasts or armpits. Pain, burning, or bleeding when you urinate. Unusual vaginal bleeding. Dizziness or headaches. Pain in your abdomen. Get help right away if you have: Shortness of breath. Chest pain. Slurred speech. Weakness or numbness in any part of your arms or legs. These symptoms may represent a serious problem that is an emergency. Do not wait to see if the symptoms will go away. Get medical help right away. Call your local emergency services (911 in the [place] Do not drive yourself to the hospital. Summary Menopause is a normal time of life when menstrual periods stop completely and the ovaries stop producing the female hormones estrogen and progesterone. HRT can reduce the frequency and severity of menopausal symptoms. Risks of HRT vary depending on your individual health and medical history. This information is not intended to replace advice given to you by your health care provider. Make sure you discuss any questions you have with your healthcare  provider. Document Revised: 08/18/2019 Document Reviewed: 08/18/2019 Elsevier Patient Education  2022 Elsevier Inc. https://www.womenshealth.gov/menopause/menopause-basics"> https://www.clinicalkey.com">  Menopause Menopause is the normal time of a woman's life when menstrual periods stop completely. It marks the natural end to a woman's ability to become pregnant. It can be defined as the absence of a menstrual period for 12 months without another medical cause. The transition to menopause (perimenopause) most often happens between the ages of 43 and 74, and can last for many years. During perimenopause, hormone levels change in your body, which can cause symptoms and affect your health. Menopause may increase your risk for: Weakened bones (osteoporosis), which causes fractures. Depression. Hardening and narrowing of the arteries (atherosclerosis), which can cause heart attacks and strokes. What are the causes? This condition is usually caused by a natural change in hormone levels that happens as you get older. The condition may also be caused by changes that are not natural, including: Surgery to remove both ovaries (surgical menopause). Side effects from some medicines, such as chemotherapy used to treat cancer (chemical menopause). What increases the risk? This condition is more likely to start at an earlier age if you have certain medical conditions or have undergone treatments, including: A tumor of the pituitary gland in the brain. A disease that affects the ovaries and hormones. Certain cancer treatments, such as chemotherapy or hormone therapy, or radiation therapy on the pelvis. Heavy smoking and excessive alcohol  use. Family history of early menopause. This condition is also more likely to develop earlier in women who are verythin. What are the signs or symptoms? Symptoms of this condition include: Hot flashes. Irregular menstrual periods. Night sweats. Changes in feelings about  sex. This could be a decrease in sex drive or an increased discomfort around your sexuality. Vaginal dryness and thinning of the vaginal walls. This may cause painful sex. Dryness of the skin and development of wrinkles. Headaches. Problems sleeping (insomnia). Mood swings or irritability. Memory problems. Weight gain. Hair growth on the face and chest. Bladder infections or problems with urinating. How is this diagnosed? This condition is diagnosed based on your medical history, a physical exam, your age, your menstrual history, and your symptoms. Hormone tests may also bedone. How is this treated? In some cases, no treatment is needed. You and your health care provider should make a decision together about whether treatment is necessary. Treatment will be based on your individual condition and preferences. Treatment for this condition focuses on managing symptoms. Treatment may include: Menopausal hormone therapy (MHT). Medicines to treat specific symptoms or complications. Acupuncture. Vitamin or herbal supplements. Before starting treatment, make sure to let your health care provider know if you have a personal or family history of these conditions: Heart disease. Breast cancer. Blood clots. Diabetes. Osteoporosis. Follow these instructions at home: Lifestyle Do not use any products that contain nicotine or tobacco, such as cigarettes, e-cigarettes, and chewing tobacco. If you need help quitting, ask your health care provider. Get at least 30 minutes of physical activity on 5 or more days each week. Avoid alcoholic and caffeinated beverages, as well as spicy foods. This may help prevent hot flashes. Get 7-8 hours of sleep each night. If you have hot flashes, try: Dressing in layers. Avoiding things that may trigger hot flashes, such as spicy food, warm places, or stress. Taking slow, deep breaths when a hot flash starts. Keeping a fan in your home and office. Find ways to  manage stress, such as deep breathing, meditation, or journaling. Consider going to group therapy with other women who are having menopause symptoms. Ask your health care provider about recommended group therapy meetings. Eating and drinking  Eat a healthy, balanced diet that contains whole grains, lean protein, low-fat dairy, and plenty of fruits and vegetables. Your health care provider may recommend adding more soy to your diet. Foods that contain soy include tofu, tempeh, and soy milk. Eat plenty of foods that contain calcium and vitamin D for bone health. Items that are rich in calcium include low-fat milk, yogurt, beans, almonds, sardines, broccoli, and kale.  Medicines Take over-the-counter and prescription medicines only as told by your health care provider. Talk with your health care provider before starting any herbal supplements. If prescribed, take vitamins and supplements as told by your health care provider. General instructions  Keep track of your menstrual periods, including: When they occur. How heavy they are and how long they last. How much time passes between periods. Keep track of your symptoms, noting when they start, how often you have them, and how long they last. Use vaginal lubricants or moisturizers to help with vaginal dryness and improve comfort during sex. Keep all follow-up visits. This is important. This includes any group therapy or counseling.  Contact a health care provider if: You are still having menstrual periods after age 24. You have pain during sex. You have not had a period for 12 months and you develop vaginal  bleeding. Get help right away if you have: Severe depression. Excessive vaginal bleeding. Pain when you urinate. A fast or irregular heartbeat (palpitations). Severe headaches. Abdominal pain or severe indigestion. Summary Menopause is a normal time of life when menstrual periods stop completely. It is usually defined as the absence of  a menstrual period for 12 months without another medical cause. The transition to menopause (perimenopause) most often happens between the ages of 19 and 31 and can last for several years. Symptoms can be managed through medicines, lifestyle changes, and complementary therapies such as acupuncture. Eat a balanced diet that is rich in nutrients to promote bone health and heart health and to manage symptoms during menopause. This information is not intended to replace advice given to you by your health care provider. Make sure you discuss any questions you have with your healthcare provider. Document Revised: 11/14/2019 Document Reviewed: 07/31/2019 Elsevier Patient Education  2022 [employer].

## 2020-10-18 NOTE — Progress Notes (Signed)
Patient ID: [name], female   DOB: 12/23/1970, 50 y.o.   MRN: [id number] History of Present Illness: [name] is a 50 year old white female,divorced,PM, no periods in 3 years, having hot flashes and night sweats, moody, +fatigue and +weight gain and body aches.And sleep disturbance.  PCP is Dr Lilyan Punt   Current Medications, Allergies, Past Medical History, Past Surgical History, Family History and Social History were reviewed in [employer] [facility] electronic medical record.     Review of Systems: Patient denies any daily headaches, hearing loss, fblurred vision, shortness of breath, chest pain, abdominal pain, problems with bowel movements, urination, or intercourse.  See HPI for positives.    Physical Exam:BP (!) 137/92 (BP Location: Left Arm, Patient Position: Sitting, Cuff Size: Large)   Pulse 81   Ht 5\' 5"  (1.651 m)   Wt 200 lb 6.4 oz (90.9 kg)   BMI 33.35 kg/m   General:  Well developed, well nourished, no acute distress Skin:  Warm and dry Neck:  Midline trachea, normal thyroid, good ROM, no lymphadenopathy Lungs; Clear to auscultation bilaterally Breast:  No dominant palpable mass, retraction, or nipple discharge Cardiovascular: Regular rate and rhythm Abdomen:  Soft, non tender, no hepatosplenomegaly Pelvic:  External genitalia is normal in appearance, no lesions.  The vagina is normal in appearance. Urethra has no lesions or masses. The cervix is smooth, pap with GC/CHL and HR HPV genotyping performed.  Uterus is felt to be normal size, shape, and contour.  No adnexal masses or tenderness noted.Bladder is non tender, no masses felt. Rectal: Good sphincter tone, no polyps, or hemorrhoids felt.  Hemoccult negative. Extremities/musculoskeletal:  No swelling or varicosities noted, no clubbing or cyanosis Psych:  No mood changes, alert and cooperative,seems happy AA is 1-2. Fall risk is low Depression screen [facility] 2/9 10/18/2020 09/01/2020 02/26/2020  Decreased Interest 1 0 0   Down, Depressed, Hopeless 1 0 0  PHQ - 2 Score 2 0 0  Altered sleeping 3 2 -  Tired, decreased energy 3 2 -  Change in appetite 3 0 -  Feeling bad or failure about yourself  0 0 -  Trouble concentrating 1 1 -  Moving slowly or fidgety/restless 0 0 -  Suicidal thoughts 0 0 -  PHQ-9 Score 12 5 -  Difficult doing work/chores - - -  Some recent data might be hidden   On Celexa  GAD 7 : Generalized Anxiety Score 10/18/2020 12/12/2019 03/21/2018 06/06/2017  Nervous, Anxious, on Edge 1 1 3 2   Control/stop worrying 2 0 1 2  Worry too much - different things 2 1 1 2   Trouble relaxing 2 2 2 3   Restless 2 1 0 3  Easily annoyed or irritable 0 2 3 3   Afraid - awful might happen 0 0 0 0  Total GAD 7 Score 9 7 10 15   Anxiety Difficulty - Somewhat difficult Somewhat difficult Extremely difficult      Upstream - 10/18/20 0940       Pregnancy Intention Screening   Does the patient want to become pregnant in the next year? N/A    Does the patient's partner want to become pregnant in the next year? N/A    Would the patient like to discuss contraceptive options today? N/A      Contraception Wrap Up   Current Method Female Sterilization    End Method Female Sterilization    Contraception Counseling Provided No  Examination chaperoned by Faith Rogue LPN   Impression and Plan: 1. Encounter for gynecological examination with Papanicolaou smear of cervix Pap sent Physical in 1 year Pap in 3 if normal Mammogram yearly Labs with PCP Colonoscopy per GI  2. Encounter for screening fecal occult blood testing  3. Hot flashes Discussed HRT, will rx estrace 1 mg and Prometrium Meds ordered this encounter  Medications   estradiol (ESTRACE) 1 MG tablet    Sig: Take 1 tablet (1 mg total) by mouth daily.    Dispense:  30 tablet    Refill:  11    Order Specific Question:   Supervising Provider    Answer:   Duane Lope H [[id number]]   progesterone (PROMETRIUM) 200 MG capsule     Sig: Take 1 tablet at bedtime    Dispense:  30 capsule    Refill:  12    Order Specific Question:   Supervising Provider    Answer:   Duane Lope H [[id number]]   Follow up in 3 months   4. Night sweats   5. Fatigue, unspecified type   6. Menopause Review handouts

## 2020-10-20 LAB — CYTOLOGY - PAP
Adequacy: ABSENT
Chlamydia: NEGATIVE
Comment: NEGATIVE
Comment: NEGATIVE
Comment: NORMAL
Diagnosis: NEGATIVE
High risk HPV: NEGATIVE
Neisseria Gonorrhea: NEGATIVE

## 2020-10-26 ENCOUNTER — Other Ambulatory Visit: Payer: Self-pay | Admitting: Family Medicine

## 2020-10-26 DIAGNOSIS — F411 Generalized anxiety disorder: Secondary | ICD-10-CM

## 2020-11-09 ENCOUNTER — Other Ambulatory Visit: Payer: Self-pay | Admitting: Adult Health

## 2020-11-25 ENCOUNTER — Telehealth: Payer: Self-pay | Admitting: Family Medicine

## 2020-11-25 NOTE — Telephone Encounter (Signed)
Pt will need TB test. Pt contacted and placed on schedule for TB test for Wednesday @ 8:30 am. Form at nurses station.

## 2020-11-25 NOTE — Telephone Encounter (Signed)
Patient dropped off form to be completed . In nurse's box box for review first

## 2020-11-26 NOTE — Telephone Encounter (Signed)
Pt will have TB test read on Friday 11/29/20 at appt

## 2020-12-01 ENCOUNTER — Other Ambulatory Visit (INDEPENDENT_AMBULATORY_CARE_PROVIDER_SITE_OTHER): Payer: 59 | Admitting: *Deleted

## 2020-12-01 ENCOUNTER — Other Ambulatory Visit: Payer: Self-pay

## 2020-12-01 DIAGNOSIS — Z111 Encounter for screening for respiratory tuberculosis: Secondary | ICD-10-CM | POA: Diagnosis not present

## 2020-12-03 ENCOUNTER — Other Ambulatory Visit: Payer: Self-pay

## 2020-12-03 ENCOUNTER — Encounter: Payer: Self-pay | Admitting: Nurse Practitioner

## 2020-12-03 ENCOUNTER — Ambulatory Visit (INDEPENDENT_AMBULATORY_CARE_PROVIDER_SITE_OTHER): Payer: 59 | Admitting: Nurse Practitioner

## 2020-12-03 VITALS — BP 123/88 | HR 84 | Ht 65.0 in | Wt 199.6 lb

## 2020-12-03 DIAGNOSIS — G8929 Other chronic pain: Secondary | ICD-10-CM

## 2020-12-03 DIAGNOSIS — R922 Inconclusive mammogram: Secondary | ICD-10-CM

## 2020-12-03 DIAGNOSIS — R9230 Dense breasts, unspecified: Secondary | ICD-10-CM

## 2020-12-03 DIAGNOSIS — Z79891 Long term (current) use of opiate analgesic: Secondary | ICD-10-CM

## 2020-12-03 DIAGNOSIS — M545 Low back pain, unspecified: Secondary | ICD-10-CM | POA: Diagnosis not present

## 2020-12-03 MED ORDER — HYDROCODONE-ACETAMINOPHEN 10-325 MG PO TABS: ORAL_TABLET | ORAL | 0 refills | Status: DC

## 2020-12-03 NOTE — Progress Notes (Signed)
Subjective:    Patient ID: [name], female    DOB: [birth year], 50 y.o.   MRN: [id number]  HPI Patient needs form filled out for work   This patient was seen today for chronic pain  The medication list was reviewed and updated.   -Compliance with medication: yes  - Number patient states they take daily: take as needed - not every day- only took 2 this week  -when was the last dose patient took? Only took 2 this week  The patient was advised the importance of maintaining medication and not using illegal substances with these.  Here for refills and follow up  Side effects or complications from medications: no  Patient is aware that pain medications are meant to minimize the severity of the pain to allow their pain levels to improve to allow for better function. They are aware of that pain medications cannot totally remove their pain. States she has generalized migratory joint pain ever since she had COVID.  Hurts every morning to she gets up and moves around.  She cannot lie on her right side due to right hip pain.  Sometimes worse with weather and certain activities.  Bilateral low back pain worse on the left side.  Some relief with OTC NSAIDs and heating pad. Of note patient gets her physicals with gynecology but her mammogram was ordered through our office. 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     Objective:   Physical Exam NAD.  Alert, oriented.  Lungs clear.  Heart regular rate rhythm.  Able to raise and lower the table which helped patient get on and off the table.  Gait slow but steady. Mammogram dated 09/09/2020 is normal with breast density category C. Tyrer-Cuzick screening for lifetime breast cancer risk is 11.9%.  Today's Vitals   12/03/20 0929  BP: 123/88  Pulse: 84  Weight: 199 lb 9.6 oz (90.5 kg)  Height: 5\' 5"  (1.651 m)   Body mass index is 33.22 kg/m.       Assessment & Plan:    Problem List Items Addressed This Visit       Other   Chronic back pain   Relevant Medications   HYDROcodone-acetaminophen (NORCO) 10-325 MG tablet   Dense breasts   Encounter for long-term opiate analgesic use - Primary   Meds ordered this encounter  Medications   DISCONTD: HYDROcodone-acetaminophen (NORCO) 10-325 MG tablet    Sig: Take one tablet tid prn pain    Dispense:  75 tablet    Refill:  0    Order Specific Question:   Supervising Provider    Answer:   A [[id number]]   DISCONTD: HYDROcodone-acetaminophen (NORCO) 10-325 MG tablet    Sig: Take one tablet tid prn pain    Dispense:  75 tablet    Refill:  0    May fill 30 days from 12/03/20    Order Specific Question:   Supervising Provider    Answer:   02/02/21 A [[id number]]   HYDROcodone-acetaminophen (NORCO) 10-325 MG tablet    Sig: Take one tablet tid prn pain    Dispense:  75 tablet    Refill:  0    May fill 60 days from 12/03/20    Order Specific Question:   Supervising Provider    Answer:   02/02/21 A [[id number]]    After the visit it was noted the patient has not had a  urine drug screen since April of last year.  She now has insurance.  We will send a note to the nurses to contact her to get this done. Return in about 3 months (around 03/05/2021) for Pain management.

## 2020-12-06 ENCOUNTER — Other Ambulatory Visit: Payer: Self-pay | Admitting: Family Medicine

## 2020-12-10 ENCOUNTER — Ambulatory Visit (INDEPENDENT_AMBULATORY_CARE_PROVIDER_SITE_OTHER): Payer: 59 | Admitting: Nurse Practitioner

## 2020-12-10 ENCOUNTER — Encounter: Payer: Self-pay | Admitting: Nurse Practitioner

## 2020-12-10 ENCOUNTER — Other Ambulatory Visit: Payer: Self-pay

## 2020-12-10 ENCOUNTER — Other Ambulatory Visit (HOSPITAL_COMMUNITY)
Admission: RE | Admit: 2020-12-10 | Discharge: 2020-12-10 | Disposition: A | Discharge status: Home / Self Care | Payer: 59 | Source: Ambulatory Visit | Attending: Nurse Practitioner | Admitting: Nurse Practitioner

## 2020-12-10 VITALS — BP 120/81 | HR 85 | Temp 98.1°F | Ht 65.0 in | Wt 201.0 lb

## 2020-12-10 DIAGNOSIS — R319 Hematuria, unspecified: Secondary | ICD-10-CM | POA: Diagnosis not present

## 2020-12-10 LAB — BASIC METABOLIC PANEL
Anion gap: 7 (ref 5–15)
BUN: 15 mg/dL (ref 6–20)
CO2: 25 mmol/L (ref 22–32)
Calcium: 9.5 mg/dL (ref 8.9–10.3)
Chloride: 110 mmol/L (ref 98–111)
Creatinine, Ser: 0.71 mg/dL (ref 0.44–1.00)
GFR, Estimated: >60 mL/min (ref >60)
Glucose, Bld: 95 mg/dL (ref 70–99)
Potassium: 4 mmol/L (ref 3.5–5.1)
Sodium: 142 mmol/L (ref 135–145)

## 2020-12-10 LAB — CBC WITH DIFFERENTIAL/PLATELET
Abs Immature Granulocytes: 0.02 10*3/uL (ref 0.00–0.07)
Basophils Absolute: 0 10*3/uL (ref 0.0–0.1)
Basophils Relative: 1 %
Eosinophils Absolute: 0.1 10*3/uL (ref 0.0–0.5)
Eosinophils Relative: 1 %
HCT: 47.8 % — ABNORMAL HIGH (ref 36.0–46.0)
Hemoglobin: 15.5 g/dL — ABNORMAL HIGH (ref 12.0–15.0)
Immature Granulocytes: 0 %
Lymphocytes Relative: 17 %
Lymphs Abs: 1.4 10*3/uL (ref 0.7–4.0)
MCH: 30 pg (ref 26.0–34.0)
MCHC: 32.4 g/dL (ref 30.0–36.0)
MCV: 92.5 fL (ref 80.0–100.0)
Monocytes Absolute: 0.6 10*3/uL (ref 0.1–1.0)
Monocytes Relative: 7 %
Neutro Abs: 6.4 10*3/uL (ref 1.7–7.7)
Neutrophils Relative %: 74 %
Platelets: 174 10*3/uL (ref 150–400)
RBC: 5.17 MIL/uL — ABNORMAL HIGH (ref 3.87–5.11)
RDW: 13.6 % (ref 11.5–15.5)
WBC: 8.6 10*3/uL (ref 4.0–10.5)
nRBC: 0 % (ref 0.0–0.2)

## 2020-12-10 LAB — POCT UA - MICROSCOPIC ONLY: Bacteria, U Microscopic: NEGATIVE

## 2020-12-10 LAB — POCT URINALYSIS DIP (MANUAL ENTRY)
Bilirubin, UA: NEGATIVE
Glucose, UA: NEGATIVE mg/dL
Ketones, POC UA: NEGATIVE mg/dL
Nitrite, UA: NEGATIVE
Protein Ur, POC: 30 mg/dL — AB
Spec Grav, UA: >=1.03 — AB (ref 1.010–1.025)
Urobilinogen, UA: 0.2 E.U./dL
pH, UA: 5 (ref 5.0–8.0)

## 2020-12-10 NOTE — Progress Notes (Signed)
   Subjective:    Patient ID: [name], female    DOB: [birth year], 50 y.o.   MRN: [id number]  HPI [facility] - hematuria - blood, protein, WBC found- taking Cipro - urine culture showed no uti and was recommended gyn or nephrology Presents for complaints of gross hematuria that began 4 days ago.  At first urine was very dark and then pink-tinged.  Has had off-and-on bleeding since then.  Only occurs with urination, no vaginal bleeding.  No dysuria urgency or frequency.  No fever.  Mild generalized low back pain.  No history of recent UTI.  No history of kidney stones.  Had frequent UTIs in her childhood, saw urologist.  These eventually resolved.  Slight nausea, no vomiting.  Has increased her fluid intake.  Mild fatigue.  Was seen at [facility] occupational health/urgent care.  States she received a phone call from the nurse that her urine culture was negative for infection which was verified by our nurse.  Currently on a course of Cipro twice a day.       Objective:   Physical Exam NAD.  Alert, oriented.  Lungs clear.  Heart regular rate rhythm.  Negative right CVA tenderness.  Moderate left CVA/flank tenderness.  Abdomen soft nondistended with mild suprapubic area discomfort, otherwise benign. Results for orders placed or performed in visit on 12/10/20  POCT urinalysis dipstick  Result Value Ref Range   Color, UA straw (A) yellow   Clarity, UA clear clear   Glucose, UA negative negative mg/dL   Bilirubin, UA negative negative   Ketones, POC UA negative negative mg/dL   Spec Grav, UA >=1.030 (A) 1.010 - 1.025   Blood, UA large (A) negative   pH, UA 5.0 5.0 - 8.0   Protein Ur, POC =30 (A) negative mg/dL   Urobilinogen, UA 0.2 0.2 or 1.0 E.U./dL   Nitrite, UA Negative Negative   Leukocytes, UA Trace (A) Negative  POCT UA - Microscopic Only  Result Value Ref Range   WBC, Ur, HPF, POC rare 0 - 5   RBC, Urine, Miroscopic TNTC 0 - 2   Bacteria, U Microscopic neg None - Trace    Mucus, UA     Epithelial cells, urine per micros occas    Crystals, Ur, HPF, POC     Casts, Ur, LPF, POC     Yeast, UA     Today's Vitals   12/10/20 0901  BP: 120/81  Pulse: 85  Temp: 98.1 F (36.7 C)  SpO2: 99%  Weight: 201 lb (91.2 kg)  Height: $Remove'5\' 5"'[id number]$  (1.651 m)   Body mass index is 33.45 kg/m.        Assessment & Plan:  Hematuria, unspecified type - Plan: POCT urinalysis dipstick, CBC with Differential/Platelet, Basic metabolic panel, POCT UA - Microscopic Only, CT RENAL STONE STUDY, CANCELED: CT RENAL STONE STUDY Stat CBC and met 7 ordered. CT scan of the abdomen renal stone protocol ordered. Further follow-up based on test results.

## 2020-12-13 ENCOUNTER — Encounter: Payer: Self-pay | Admitting: Nurse Practitioner

## 2020-12-16 ENCOUNTER — Ambulatory Visit (HOSPITAL_COMMUNITY)
Admission: RE | Admit: 2020-12-16 | Discharge: 2020-12-16 | Disposition: A | Discharge status: Home / Self Care | Payer: 59 | Source: Ambulatory Visit | Attending: Nurse Practitioner | Admitting: Nurse Practitioner

## 2020-12-16 ENCOUNTER — Other Ambulatory Visit: Payer: Self-pay

## 2020-12-16 DIAGNOSIS — R319 Hematuria, unspecified: Secondary | ICD-10-CM | POA: Diagnosis not present

## 2020-12-26 ENCOUNTER — Other Ambulatory Visit: Payer: Self-pay | Admitting: Family Medicine

## 2020-12-26 DIAGNOSIS — I10 Essential (primary) hypertension: Secondary | ICD-10-CM

## 2021-01-07 ENCOUNTER — Encounter: Payer: Self-pay | Admitting: Nurse Practitioner

## 2021-01-07 ENCOUNTER — Ambulatory Visit (INDEPENDENT_AMBULATORY_CARE_PROVIDER_SITE_OTHER): Payer: 59 | Admitting: Nurse Practitioner

## 2021-01-07 ENCOUNTER — Other Ambulatory Visit: Payer: Self-pay

## 2021-01-07 VITALS — BP 129/85 | HR 92 | Temp 98.2°F | Ht 65.0 in | Wt 199.0 lb

## 2021-01-07 DIAGNOSIS — R319 Hematuria, unspecified: Secondary | ICD-10-CM | POA: Diagnosis not present

## 2021-01-07 DIAGNOSIS — N200 Calculus of kidney: Secondary | ICD-10-CM

## 2021-01-07 LAB — POCT URINALYSIS DIP (MANUAL ENTRY)
Bilirubin, UA: NEGATIVE
Glucose, UA: NEGATIVE mg/dL
Ketones, POC UA: NEGATIVE mg/dL
Leukocytes, UA: NEGATIVE
Nitrite, UA: NEGATIVE
Protein Ur, POC: 30 mg/dL — AB
Spec Grav, UA: >=1.03 — AB (ref 1.010–1.025)
Urobilinogen, UA: 0.2 E.U./dL
pH, UA: 5 (ref 5.0–8.0)

## 2021-01-07 NOTE — Progress Notes (Signed)
Subjective:    Patient ID: [name], female    DOB: 28-Oct-1970, 50 y.o.   MRN: [id number]  HPI Patient states has a 6 mm kidney stone and still exp pressure and frequency Renal CT scan  See previous note 12/10/2020.  Patient presents for recheck today.  Is now having some urgency and pressure over the past 2 weeks.  No visible blood in her urine.  Patient states she stopped drinking green tea which seemed to help.  No dysuria.  Some frequency and voiding small amounts.  Drinking mainly water.  Staying hydrated.  No pain with urination.  Having generalized low back pain into the sides of the abdomen.  Takes an occasional pain pill only with severe pain which does take the edge off.  About a week to a week and a half ago had severe pain in the back radiating into the lower abdominal area, describes as severe or was hard to move lasting for about 5 minutes.  Mostly a tolerable pain with occasional severe pain.  States it will wake her up at night at times.  No fevers.  No nausea or vomiting.  Has noticed increased swelling in her feet and lower legs.  No chest pain/ischemic type pain, unusual shortness of breath orthopnea.       Objective:   Physical Exam NAD.  Alert, oriented.  Lungs clear.  Minimal CVA tenderness bilaterally to percussion.  Heart regular rate rhythm.  Abdomen soft nondistended with minimal lower abdominal tenderness.  No rebound or guarding.  No obvious masses.  1+ pitting edema lower extremities bilaterally. Results for orders placed or performed in visit on 01/07/21  POCT urinalysis dipstick  Result Value Ref Range   Color, UA yellow yellow   Clarity, UA clear clear   Glucose, UA negative negative mg/dL   Bilirubin, UA negative negative   Ketones, POC UA negative negative mg/dL   Spec Grav, UA >=7.253 (A) 1.010 - 1.025   Blood, UA large (A) negative   pH, UA 5.0 5.0 - 8.0   Protein Ur, POC =30 (A) negative mg/dL   Urobilinogen, UA 0.2 0.2 or 1.0 E.U./dL    Nitrite, UA Negative Negative   Leukocytes, UA Negative Negative  POCT UA - Microscopic Only  Result Value Ref Range   WBC, Ur, HPF, POC rare 0 - 5   RBC, Urine, Miroscopic TNTC 0 - 2   Bacteria, U Microscopic neg None - Trace   Mucus, UA     Epithelial cells, urine per micros rare    Crystals, Ur, HPF, POC     Casts, Ur, LPF, POC     Yeast, UA     Today's Vitals   01/07/21 1450  BP: 129/85  Pulse: 92  Temp: 98.2 F (36.8 C)  SpO2: 99%  Weight: 199 lb (90.3 kg)  Height: 5\' 5"  (1.651 m)   Body mass index is 33.12 kg/m.  CT scan renal stone study dated 12/16/2020 showed a nonobstructive 6 mm stone in the right renal pelvis.       Assessment & Plan:   Problem List Items Addressed This Visit       Genitourinary   Renal stone   Relevant Medications   hydrochlorothiazide (HYDRODIURIL) 25 MG tablet   Other Relevant Orders   Ambulatory referral to Urology     Other   Hematuria - Primary   Relevant Orders   Ambulatory referral to Urology   POCT urinalysis dipstick (Completed)   POCT  UA - Microscopic Only (Completed)   Meds ordered this encounter  Medications   hydrochlorothiazide (HYDRODIURIL) 25 MG tablet    Sig: Take 1 tablet (25 mg total) by mouth daily. Prn swelling    Dispense:  30 tablet    Refill:  0    Order Specific Question:   Supervising Provider    Answer:   Kathyrn Drown [phone]   Urgent referral to urology. Reviewed warning signs.  Patient to seek help immediately this weekend if symptoms worsen.  Continue to increase fluid intake.  Continue pain medication for severe pain. Patient to call the office early next week if she has not heard back regarding referral.

## 2021-01-08 ENCOUNTER — Encounter: Payer: Self-pay | Admitting: Nurse Practitioner

## 2021-01-08 LAB — POCT UA - MICROSCOPIC ONLY: Bacteria, U Microscopic: NEGATIVE

## 2021-01-08 MED ORDER — HYDROCHLOROTHIAZIDE 25 MG PO TABS: 25.0000 mg | ORAL_TABLET | Freq: Every day | ORAL | 0 refills | Status: DC

## 2021-01-10 ENCOUNTER — Encounter (HOSPITAL_COMMUNITY): Payer: Self-pay | Admitting: *Deleted

## 2021-01-10 ENCOUNTER — Emergency Department (HOSPITAL_COMMUNITY): Payer: 59

## 2021-01-10 ENCOUNTER — Emergency Department (HOSPITAL_COMMUNITY)
Admission: EM | Admit: 2021-01-10 | Discharge: 2021-01-10 | Disposition: A | Discharge status: Home / Self Care | Payer: 59 | Attending: Emergency Medicine | Admitting: Emergency Medicine

## 2021-01-10 ENCOUNTER — Other Ambulatory Visit: Payer: Self-pay | Admitting: *Deleted

## 2021-01-10 DIAGNOSIS — N200 Calculus of kidney: Secondary | ICD-10-CM | POA: Diagnosis not present

## 2021-01-10 DIAGNOSIS — I10 Essential (primary) hypertension: Secondary | ICD-10-CM | POA: Insufficient documentation

## 2021-01-10 DIAGNOSIS — F17210 Nicotine dependence, cigarettes, uncomplicated: Secondary | ICD-10-CM | POA: Insufficient documentation

## 2021-01-10 DIAGNOSIS — R000 Tachycardia, unspecified: Secondary | ICD-10-CM | POA: Insufficient documentation

## 2021-01-10 DIAGNOSIS — R319 Hematuria, unspecified: Secondary | ICD-10-CM

## 2021-01-10 DIAGNOSIS — Z79899 Other long term (current) drug therapy: Secondary | ICD-10-CM | POA: Diagnosis not present

## 2021-01-10 DIAGNOSIS — R1011 Right upper quadrant pain: Secondary | ICD-10-CM | POA: Diagnosis present

## 2021-01-10 DIAGNOSIS — Z96651 Presence of right artificial knee joint: Secondary | ICD-10-CM | POA: Diagnosis not present

## 2021-01-10 LAB — CBC WITH DIFFERENTIAL/PLATELET
Abs Immature Granulocytes: 0.06 10*3/uL (ref 0.00–0.07)
Basophils Absolute: 0 10*3/uL (ref 0.0–0.1)
Basophils Relative: 0 %
Eosinophils Absolute: 0.1 10*3/uL (ref 0.0–0.5)
Eosinophils Relative: 1 %
HCT: 47.6 % — ABNORMAL HIGH (ref 36.0–46.0)
Hemoglobin: 15.9 g/dL — ABNORMAL HIGH (ref 12.0–15.0)
Immature Granulocytes: 0 %
Lymphocytes Relative: 15 %
Lymphs Abs: 2 10*3/uL (ref 0.7–4.0)
MCH: 29.6 pg (ref 26.0–34.0)
MCHC: 33.4 g/dL (ref 30.0–36.0)
MCV: 88.5 fL (ref 80.0–100.0)
Monocytes Absolute: 0.8 10*3/uL (ref 0.1–1.0)
Monocytes Relative: 6 %
Neutro Abs: 10.7 10*3/uL — ABNORMAL HIGH (ref 1.7–7.7)
Neutrophils Relative %: 78 %
Platelets: 208 10*3/uL (ref 150–400)
RBC: 5.38 MIL/uL — ABNORMAL HIGH (ref 3.87–5.11)
RDW: 13.6 % (ref 11.5–15.5)
WBC: 13.7 10*3/uL — ABNORMAL HIGH (ref 4.0–10.5)
nRBC: 0 % (ref 0.0–0.2)

## 2021-01-10 LAB — URINALYSIS, ROUTINE W REFLEX MICROSCOPIC
Bilirubin Urine: NEGATIVE
Glucose, UA: NEGATIVE mg/dL
Ketones, ur: NEGATIVE mg/dL
Nitrite: NEGATIVE
Protein, ur: 100 mg/dL — AB
RBC / HPF: >50 RBC/hpf — ABNORMAL HIGH (ref 0–5)
Specific Gravity, Urine: 1.029 (ref 1.005–1.030)
pH: 5 (ref 5.0–8.0)

## 2021-01-10 LAB — COMPREHENSIVE METABOLIC PANEL
ALT: 20 U/L (ref 0–44)
AST: 19 U/L (ref 15–41)
Albumin: 4.3 g/dL (ref 3.5–5.0)
Alkaline Phosphatase: 83 U/L (ref 38–126)
Anion gap: 11 (ref 5–15)
BUN: 19 mg/dL (ref 6–20)
CO2: 24 mmol/L (ref 22–32)
Calcium: 9.5 mg/dL (ref 8.9–10.3)
Chloride: 104 mmol/L (ref 98–111)
Creatinine, Ser: 0.93 mg/dL (ref 0.44–1.00)
GFR, Estimated: >60 mL/min (ref >60)
Glucose, Bld: 120 mg/dL — ABNORMAL HIGH (ref 70–99)
Potassium: 3.1 mmol/L — ABNORMAL LOW (ref 3.5–5.1)
Sodium: 139 mmol/L (ref 135–145)
Total Bilirubin: 0.4 mg/dL (ref 0.3–1.2)
Total Protein: 7.5 g/dL (ref 6.5–8.1)

## 2021-01-10 MED ORDER — HYDROMORPHONE HCL 1 MG/ML IJ SOLN
1.0000 mg | Freq: Once | INTRAMUSCULAR | Status: AC
  Administered 2021-01-10: 1 mg via INTRAVENOUS
  Filled 2021-01-10: qty 1

## 2021-01-10 MED ORDER — ONDANSETRON HCL 4 MG/2ML IJ SOLN
4.0000 mg | Freq: Once | INTRAMUSCULAR | Status: AC
  Administered 2021-01-10: 4 mg via INTRAVENOUS
  Filled 2021-01-10: qty 2

## 2021-01-10 MED ORDER — FENTANYL CITRATE PF 50 MCG/ML IJ SOSY
100.0000 ug | PREFILLED_SYRINGE | Freq: Once | INTRAMUSCULAR | Status: AC
  Administered 2021-01-10: 100 ug via INTRAVENOUS
  Filled 2021-01-10: qty 2

## 2021-01-10 MED ORDER — KETOROLAC TROMETHAMINE 30 MG/ML IJ SOLN
30.0000 mg | Freq: Once | INTRAMUSCULAR | Status: AC
  Administered 2021-01-10: 30 mg via INTRAVENOUS
  Filled 2021-01-10: qty 1

## 2021-01-10 MED ORDER — KETOROLAC TROMETHAMINE 10 MG PO TABS: 10.0000 mg | ORAL_TABLET | Freq: Three times a day (TID) | ORAL | 0 refills | Status: DC | PRN

## 2021-01-10 NOTE — ED Notes (Signed)
Pt A&OX4 ambulatory at d/c with independent steady gait, NAD. Pt verbalized understanding of d/c instructions, prescription and follow up care. 

## 2021-01-10 NOTE — ED Triage Notes (Signed)
Right flank pain , history of same, diagnosed 6 weeks ago

## 2021-01-10 NOTE — ED Notes (Signed)
Patient transported to CT 

## 2021-01-10 NOTE — Discharge Instructions (Signed)
Continue taking home medication as prescribed. Use Toradol 3 times a day to help with pain.  Take with food. Use Zofran as needed for nausea or vomiting. Take your prescribed pain medicine as needed for breakthrough pain. Follow-up with urology as scheduled tomorrow morning. Return to emergency room if you develop fevers, persistent vomiting, or any new or worsening symptoms in the meantime.

## 2021-01-10 NOTE — ED Provider Notes (Signed)
[facility] EMERGENCY DEPARTMENT Provider Note   CSN: [id number] Arrival date & time: 01/10/21  1211     History Chief Complaint  Patient presents with   Flank Pain    [name] is a 50 y.o. female presenting for evaluation of right-sided flank and abdominal pain.  Patient states about 6 weeks ago she developed pain in her right back.  She had associated hematuria.  Saw her PCP, who had a CT done which showed a 6 mm stone in the right pelvis.  Since then, patient has continued to have intermittent pain.  In the past few days, pain has worsened and is now more severe and constant.  She has associated nausea and vomiting.  She also reports decreased urination.  Her doctor placed on a fluid pill which worked well the first day, however she has had only dribbling in the past 2 days.  She denies fevers or chills.  No cough, chest pain, shortness of breath.  No abnormal bowel movements.  No previous history of kidney stones.  She is still waiting for an appointment with urology.  Patient reports a half pain pill today, no other pain medication.  HPI     Past Medical History:  Diagnosis Date   Anxiety    Arthritis    Knee   Chronic back pain    Chronic knee pain    Family history of anesthesia complication    Brother and Mother woke up during surgery; [place] and felt could not speak   GERD (gastroesophageal reflux disease)    Headache    stress   HTN (hypertension), benign 01/22/2019   IBS (irritable bowel syndrome)    Menorrhagia 09/09/2012    Patient Active Problem List   Diagnosis Date Noted   Hematuria 01/07/2021   Renal stone 01/07/2021   Dense breasts 12/03/2020   Menopause 10/18/2020   Fatigue 10/18/2020   Night sweats 10/18/2020   Encounter for screening fecal occult blood testing 10/18/2020   Encounter for gynecological examination with Papanicolaou smear of cervix 10/18/2020   Cigarette smoker 12/14/2019   Chronic migraine without aura without status  migrainosus, not intractable 12/12/2019   Sleep disturbance 09/15/2019   HTN (hypertension), benign 01/22/2019   Depression, major, single episode, moderate ([place]) 03/21/2018   Hot flashes 03/21/2018   Chronic diarrhea 11/12/2015   Irritable bowel syndrome with diarrhea 06/19/2015   Arthritis of knee, degenerative 12/15/2014   Anxiety as acute reaction to exceptional stress 11/28/2014   Chondromalacia of right knee 11/28/2014   Rebound headache 11/06/2014   Stressful life event affecting family 11/06/2014   Encounter for long-term opiate analgesic use 09/15/2014   Tinea versicolor 09/15/2014   Chronic back pain 06/16/2014   Arthritis of both knees 06/16/2014   DUB (dysfunctional uterine bleeding) 02/17/2013   Family history of colon cancer 12/19/2012   Dyspepsia 12/17/2012   GERD (gastroesophageal reflux disease) 11/03/2012   Analgesic overuse headache 11/03/2012   Low back pain 09/09/2012   Menorrhagia 09/09/2012   Arthritis 07/24/2012    Past Surgical History:  Procedure Laterality Date   BIOPSY  02/01/2016   Procedure: BIOPSY;  Surgeon: Danie Binder, MD;  Location: [facility];  Service: Endoscopy;;  random colon biopsy   CESAREAN SECTION     X2    CHOLECYSTECTOMY N/A 01/17/2013   Procedure: LAPAROSCOPIC CHOLECYSTECTOMY;  Surgeon: Scherry Ran, MD;  Location: [facility];  Service: General;  Laterality: N/A;   COLONOSCOPY WITH PROPOFOL N/A 02/01/2016  Procedure: COLONOSCOPY WITH PROPOFOL;  Surgeon: Danie Binder, MD;  Location: [facility];  Service: Endoscopy;  Laterality: N/A;  11:45 am   DILITATION & CURRETTAGE/HYSTROSCOPY WITH THERMACHOICE ABLATION N/A 02/25/2013   Procedure: DILATATION & CURETTAGE/HYSTEROSCOPY WITH THERMACHOICE ABLATION;  Surgeon: Jonnie Kind, MD;  Location: [facility];  Service: Gynecology;  Laterality: N/A;  Total Therapy Time:8:54 minutes Temperature:87 degrees   ENDOMETRIAL ABLATION  12/14   ESOPHAGOGASTRODUODENOSCOPY N/A 12/27/2012   Dr.  Oneida Alar: stricture at GE junction s/p dilation, non-erosive gastritis, normal duodenum.    TOTAL KNEE ARTHROPLASTY Right 12/15/2014   Procedure: TOTAL KNEE ARTHROPLASTY;  Surgeon: Meredith Pel, MD;  Location: [place];  Service: Orthopedics;  Laterality: Right;   TUBAL LIGATION     along with c-section     OB History     Gravida  2   Para  2   Term      Preterm      AB      Living  4      SAB      IAB      Ectopic      Multiple  2   Live Births              Family History  Problem Relation Age of Onset   Hypertension Mother    Diabetes Mother    Thyroid disease Mother    COPD Mother    Arthritis Mother    Depression Mother    Mental illness Mother    Stroke Mother    Prostate cancer Father        unsure primary    Colon cancer Father    Diabetes Maternal Grandmother    Diabetes Paternal Grandmother    Cancer Sister    Heart attack Brother    Colon cancer Brother    Diabetes Sister    Depression Sister    Mental illness Sister    Colon polyps Sister     Social History   Tobacco Use   Smoking status: Every Day    Packs/day: 0.50    Years: 24.00    Pack years: 12.00    Types: Cigarettes    Start date: 11/26/1983   Smokeless tobacco: Never   Tobacco comments:    quit 3 times for a total of 8 years since 1985  Vaping Use   Vaping Use: Every day  Substance Use Topics   Alcohol use: No    Comment: socially just mixed drinks    Drug use: No    Home Medications Prior to Admission medications   Medication Sig Start Date End Date Taking? Authorizing Provider  ketorolac (TORADOL) 10 MG tablet Take 1 tablet (10 mg total) by mouth every 8 (eight) hours as needed. 01/10/21  Yes Tiffny Gemmer, PA-C  amLODipine (NORVASC) 2.5 MG tablet TAKE 1 TABLET BY MOUTH EVERY DAY 12/27/20   Kathyrn Drown, MD  citalopram (CELEXA) 20 MG tablet TAKE 1 TABLET BY MOUTH EVERY DAY 10/26/20   Kathyrn Drown, MD  estradiol (ESTRACE) 1 MG tablet TAKE 1 TABLET BY  MOUTH EVERY DAY 11/09/20   Estill Dooms, NP  hydrochlorothiazide (HYDRODIURIL) 25 MG tablet Take 1 tablet (25 mg total) by mouth daily. Prn swelling 01/08/21   Nilda Simmer, NP  HYDROcodone-acetaminophen [facility]) 10-325 MG tablet Take one tablet tid prn pain 12/03/20   Nilda Simmer, NP  pantoprazole (PROTONIX) 40 MG tablet TAKE 1 TABLET BY MOUTH EVERY DAY 12/06/20  Babs Sciara, MD  progesterone (PROMETRIUM) 200 MG capsule Take 1 tablet at bedtime 10/18/20   Cyril Mourning A, NP  propranolol (INDERAL) 40 MG tablet TAKE 1 TABLET BY MOUTH TWICE A DAY 08/19/20   Babs Sciara, MD    Allergies    Doxycycline, Penicillins, Sulfa antibiotics, and Xanax [alprazolam]  Review of Systems   Review of Systems  Gastrointestinal:  Positive for abdominal pain, nausea and vomiting.  Genitourinary:  Positive for difficulty urinating, flank pain and hematuria.  All other systems reviewed and are negative.  Physical Exam Updated Vital Signs BP 116/82 (BP Location: Left Arm)   Pulse 80   Temp 97.9 F (36.6 C) (Oral)   Resp 16   LMP 01/23/2016   SpO2 96%   Physical Exam Vitals and nursing note reviewed.  Constitutional:      Appearance: Normal appearance.     Comments: Appears uncomfortable due to pain  HENT:     Head: Normocephalic and atraumatic.  Eyes:     Conjunctiva/sclera: Conjunctivae normal.     Pupils: Pupils are equal, round, and reactive to light.  Cardiovascular:     Rate and Rhythm: Regular rhythm. Tachycardia present.     Pulses: Normal pulses.  Pulmonary:     Effort: Pulmonary effort is normal. No respiratory distress.     Breath sounds: Normal breath sounds. No wheezing.     Comments: Speaking in full sentences.  Clear lung sounds in all fields. Abdominal:     General: There is no distension.     Palpations: Abdomen is soft.     Tenderness: There is abdominal tenderness. There is right CVA tenderness.     Comments: Diffuse tenderness palpation of the  low back, worse on the right side.  Diffuse tenderness palpation of the abdomen, worse in the right upper and right lower quadrants  Musculoskeletal:        General: Normal range of motion.     Cervical back: Normal range of motion and neck supple.     Right lower leg: No edema.     Left lower leg: No edema.     Comments: No pitting edema, patient reports both legs are swollen for the baseline  Skin:    General: Skin is warm and dry.     Capillary Refill: Capillary refill takes less than 2 seconds.  Neurological:     Mental Status: She is alert and oriented to person, place, and time.  Psychiatric:        Mood and Affect: Mood and affect normal.        Speech: Speech normal.        Behavior: Behavior normal.    ED Results / Procedures / Treatments   Labs (all labs ordered are listed, but only abnormal results are displayed) Labs Reviewed  CBC WITH DIFFERENTIAL/PLATELET - Abnormal; Notable for the following components:      Result Value   WBC 13.7 (*)    RBC 5.38 (*)    Hemoglobin 15.9 (*)    HCT 47.6 (*)    Neutro Abs 10.7 (*)    All other components within normal limits  COMPREHENSIVE METABOLIC PANEL - Abnormal; Notable for the following components:   Potassium 3.1 (*)    Glucose, Bld 120 (*)    All other components within normal limits  URINALYSIS, ROUTINE W REFLEX MICROSCOPIC - Abnormal; Notable for the following components:   APPearance HAZY (*)    Hgb urine dipstick LARGE (*)  Protein, ur 100 (*)    Leukocytes,Ua TRACE (*)    RBC / HPF >50 (*)    Bacteria, UA RARE (*)    All other components within normal limits    EKG None  Radiology CT Renal Stone Study  Result Date: 01/10/2021 CLINICAL DATA:  Flank pain. EXAM: CT ABDOMEN AND PELVIS WITHOUT CONTRAST TECHNIQUE: Multidetector CT imaging of the abdomen and pelvis was performed following the standard protocol without IV contrast. COMPARISON:  CT renal stone 12/16/2020. FINDINGS: Lower chest: No acute  abnormality. Hepatobiliary: No focal liver abnormality is seen. Status post cholecystectomy. No biliary dilatation. Pancreas: Unremarkable. No pancreatic ductal dilatation or surrounding inflammatory changes. Spleen: Normal in size without focal abnormality. Adrenals/Urinary Tract: There are two 1 mm calculi at the right ureterovesicular junction. There is mild right-sided hydronephrosis. There is a 7 mm calculus in the right renal pelvis similar to the prior study. Cortical hypodensity in the superior pole the right kidney is too small to characterize and unchanged, favored as a cyst. Small mildly hyperdense area in the lower pole the left kidney is too small to characterize and unchanged, possibly a hyperdense cyst. The left kidney is otherwise within normal limits. Bladder is decompressed and otherwise within normal limits. Stomach/Bowel: Stomach is within normal limits. Appendix appears normal. No evidence of bowel wall thickening, distention, or inflammatory changes. There is sigmoid and descending colon diverticulosis without evidence for acute diverticulitis. Vascular/Lymphatic: No significant vascular findings are present. No enlarged abdominal or pelvic lymph nodes. Reproductive: Uterus and bilateral adnexa are unremarkable. Other: There is a tiny fat containing umbilical hernia. There is no ascites. Musculoskeletal: No acute or significant osseous findings. IMPRESSION: 1. There are two 1 mm calculi at the right ureterovesicular junction with mild obstructive uropathy. 2. Stable nonobstructing right renal pelvis calculus. 3. Colonic diverticulosis without evidence for diverticulitis. Electronically Signed   By: Ronney Asters M.D.   On: 01/10/2021 15:40    Procedures Procedures   Medications Ordered in ED Medications  ondansetron (ZOFRAN) injection 4 mg (4 mg Intravenous Given 01/10/21 1419)  fentaNYL (SUBLIMAZE) injection 100 mcg (100 mcg Intravenous Given 01/10/21 1421)  HYDROmorphone (DILAUDID)  injection 1 mg (1 mg Intravenous Given 01/10/21 1556)  ketorolac (TORADOL) 30 MG/ML injection 30 mg (30 mg Intravenous Given 01/10/21 1713)    ED Course  I have reviewed the triage vital signs and the nursing notes.  Pertinent labs & imaging results that were available during my care of the patient were reviewed by me and considered in my medical decision making (see chart for details).    MDM Rules/Calculators/A&P                           Patient presenting for evaluation of flank and abdominal pain.  On exam, patient appears uncomfortable due to pain, otherwise nontoxic.  She does have diffuse tenderness.  This is been going on for about 6 weeks.  While she did have a CT a month ago, will repeat to ensure no significant change.  Obtain blood work to ensure no abnormality in kidney function due to decreased urination.  Will obtain urine to ensure no infection.  Will treat symptomatically.  Labs interpreted by me, overall reassuring.  No significant kidney function abnormality.  Urine without signs of infection.  CT shows 2 small, 1 mm, stones at the UVJ causing hydro-.  The renal pelvis stone persists, is unchanged.  On reevaluation after pain control, patient reports  significant improvement of symptoms.  She discussed she has been in contact with the urologist since she has been in the ER, has an appointment scheduled for tomorrow at 1015.  As her pain is controlled, there are no signs of infection or acute need for emergent hospitalization, will discharge with symptomatic management and close follow-up tomorrow morning.  At this time, patient appears safe for discharge.  Return precautions given.  Patient states she understands and agrees to plan  Final Clinical Impression(s) / ED Diagnoses Final diagnoses:  Right kidney stone    Rx / DC Orders ED Discharge Orders          Ordered    ketorolac (TORADOL) 10 MG tablet  Every 8 hours PRN        01/10/21 1709              Franchot Heidelberg, PA-C 01/10/21 1826    Varney Biles, MD 01/12/21 1255

## 2021-01-10 NOTE — ED Notes (Signed)
Pt is requesting more pain medication. PA informed via epic secure chat.

## 2021-01-11 ENCOUNTER — Ambulatory Visit (INDEPENDENT_AMBULATORY_CARE_PROVIDER_SITE_OTHER): Payer: 59 | Admitting: Urology

## 2021-01-11 ENCOUNTER — Ambulatory Visit: Payer: 59 | Admitting: Urology

## 2021-01-11 ENCOUNTER — Encounter: Payer: Self-pay | Admitting: Urology

## 2021-01-11 ENCOUNTER — Other Ambulatory Visit: Payer: Self-pay

## 2021-01-11 VITALS — BP 113/79 | HR 88 | Wt 196.0 lb

## 2021-01-11 DIAGNOSIS — N202 Calculus of kidney with calculus of ureter: Secondary | ICD-10-CM | POA: Diagnosis not present

## 2021-01-11 DIAGNOSIS — N200 Calculus of kidney: Secondary | ICD-10-CM

## 2021-01-11 DIAGNOSIS — N201 Calculus of ureter: Secondary | ICD-10-CM | POA: Insufficient documentation

## 2021-01-11 MED ORDER — KETOROLAC TROMETHAMINE 30 MG/ML IJ SOLN
30.0000 mg | Freq: Once | INTRAMUSCULAR | Status: AC
Start: 2021-01-11 — End: 2021-01-11
  Administered 2021-01-11: 30 mg via INTRAMUSCULAR

## 2021-01-11 MED ORDER — TAMSULOSIN HCL 0.4 MG PO CAPS: 0.4000 mg | ORAL_CAPSULE | Freq: Every day | ORAL | 1 refills | Status: DC

## 2021-01-11 NOTE — Progress Notes (Signed)
Patient received 30mg  IM Toradol. Tolerated with no difficulty.

## 2021-01-11 NOTE — Progress Notes (Signed)
Urological Symptom Review  Patient is experiencing the following symptoms: Frequent urination Blood in urine   Review of Systems  Gastrointestinal (upper)  : Nausea Indigestion/heartburn  Gastrointestinal (lower) : Diarrhea  Constitutional : Negative for symptoms  Skin: Negative for skin symptoms  Eyes: Negative for eye symptoms  Ear/Nose/Throat : Negative for Ear/Nose/Throat symptoms  Hematologic/Lymphatic: Easy bruising  Cardiovascular : Leg swelling  Respiratory : Negative for respiratory symptoms  Endocrine: Negative for endocrine symptoms  Musculoskeletal: Back pain Joint pain  Neurological: Headaches  Psychologic: Negative for psychiatric symptoms

## 2021-01-11 NOTE — H&P (View-Only) (Signed)
Assessment: 1. Ureteral calculus, right; 2 1 mm calculi, UVJ   2. Nephrolithiasis, right; 6 mm, renal pelvis     Plan: I personally reviewed the CT studies from 12/16/2020 and 01/10/2021.  I discussed these results with the patient in detail today.  Her most recent CT scan shows a 6 to 7 mm calculus in the area of the right renal pelvis as well as two 1 mm calculi in the distal right ureter at the UVJ with dilation of the right ureter.  I think these distal calculi are currently causing her symptoms.  Management options for ureteral calculi discussed including spontaneous passage, shockwave lithotripsy, and ureteroscopic stone manipulation.  Given the small stone size, she has a >90% chance of spontaneous passage.  I did discuss treatment of the right renal stone with shockwave lithotripsy or ureteroscopic stone manipulation and laser lithotripsy.  Risk and benefits of both procedures discussed.  She is interested in pursuing shockwave lithotripsy. KUB today to make sure stone is easily visualized. Continue pain medication as needed. Toradol 30 mg IM given in the office today. Begin tamsulosin 0.4 mg daily to assist with stone passage. Strain urine.  Procedure: The patient will be scheduled for right ESL at [facility].  Surgical request is placed with the surgery schedulers and will be scheduled at the patient's/family request. Informed consent is given as documented below. Anesthesia:  local  The patient does not have sleep apnea, history of MRSA, history of VRE, history of cardiac device requiring special anesthetic needs. Patient is stable and considered clear for surgical in an outpatient ambulatory surgery setting as well as patient hospital setting.  Consent for Operation or Procedure: Provider Certification I hereby certify that the nature, purpose, benefits, usual and most frequent risks of, and alternatives to, the operation or procedure have been explained to the patient (or  person authorized to sign for the patient) either by me as responsible physician or by the provider who is to perform the operation or procedure. Time spent such that the patient/family has had an opportunity to ask questions, and that those questions have been answered. The patient or the patient's representative has been advised that selected tasks may be performed by assistants to the primary health care provider(s). I believe that the patient (or person authorized to sign for the patient) understands what has been explained, and has consented to the operation or procedure. No guarantees were implied or made.  01/13/21 Addendum: The patient's insurance would not approve shockwave lithotripsy.  Consequently, she has elected to proceed with right ureteroscopic laser lithotripsy for management of the right renal calculus.  Risks, benefits, alternatives were discussed with the patient in detail.  Potential risks including, but not limited to, infection; bleeding;  injury to urethra, bladder, or ureter; possible need of other treatments; possible failure to remove the calculus; ureteral  stricture formation; cardiac, pulmonary, cerebrovascular events; and anesthetic complications were discussed.  The patient understands and wishes to proceed.  Procedure: The patient will be scheduled for cystoscopy, bilateral retrograde pyelograms, right ureteroscopy, laser lithotripsy, insertion of right ureteral stent at [facility].  Surgical request is placed with the surgery schedulers and will be scheduled at the patient's/family request. Informed consent is given as documented below. Anesthesia: General  The patient does not have sleep apnea, history of MRSA, history of VRE, history of cardiac device requiring special anesthetic needs. Patient is stable and considered clear for surgical in an outpatient ambulatory surgery setting as well as patient hospital  setting.  Consent for Operation or Procedure: Provider  Certification I hereby certify that the nature, purpose, benefits, usual and most frequent risks of, and alternatives to, the operation or procedure have been explained to the patient (or person authorized to sign for the patient) either by me as responsible physician or by the provider who is to perform the operation or procedure. Time spent such that the patient/family has had an opportunity to ask questions, and that those questions have been answered. The patient or the patient's representative has been advised that selected tasks may be performed by assistants to the primary health care provider(s). I believe that the patient (or person authorized to sign for the patient) understands what has been explained, and has consented to the operation or procedure. No guarantees were implied or made.   Chief Complaint:  Chief Complaint  Patient presents with   Nephrolithiasis    History of Present Illness:  [name] is a 50 y.o. year old female who is seen in consultation from Luking, Scott A, MD for evaluation of ureteral calculi and right renal calculus.  She was evaluated with a CT scan on 12/16/2020 for gross hematuria and low back pain.  CT showed a 6 mm nonobstructing calculus in the right renal pelvis.  She has had intermittent pain since that time.  She had worsening pain yesterday and was seen in the emergency room.  She reported associated nausea and vomiting.  Urinalysis showed >50 RBCs.  Repeat CT scan showed two 1 mm calculi at the right UVJ, mild right-sided hydronephrosis and a 7 mm calculus in the right renal pelvis. She continues to have gross hematuria, frequency, dysuria, and right flank and lower abdominal pain.  Her symptoms have improved slightly today.  She is not aware of passing any stones but has not been straining her urine.  No fevers or chills.  She is having some nausea without vomiting.  No prior history of kidney stones.  Past Medical History:  Past Medical History:   Diagnosis Date   Anxiety    Arthritis    Knee   Chronic back pain    Chronic knee pain    Family history of anesthesia complication    Brother and Mother woke up during surgery; Heard and felt could not speak   GERD (gastroesophageal reflux disease)    Headache    stress   HTN (hypertension), benign 01/22/2019   IBS (irritable bowel syndrome)    Menorrhagia 09/09/2012    Past Surgical History:  Past Surgical History:  Procedure Laterality Date   BIOPSY  02/01/2016   Procedure: BIOPSY;  Surgeon: Sandi L Fields, MD;  [facility];  Service: Endoscopy;;  random colon biopsy   CESAREAN SECTION     X2    CHOLECYSTECTOMY N/A 01/17/2013   Procedure: LAPAROSCOPIC CHOLECYSTECTOMY;  Surgeon: William S Bradford, MD;  Location: [facility];  Service: General;  Laterality: N/A;   COLONOSCOPY WITH PROPOFOL N/A 02/01/2016   Procedure: COLONOSCOPY WITH PROPOFOL;  Surgeon: Sandi L Fields, MD;  [facility];  Service: Endoscopy;  Laterality: N/A;  11:45 am   DILITATION & CURRETTAGE/HYSTROSCOPY WITH THERMACHOICE ABLATION N/A 02/25/2013   Procedure: DILATATION & CURETTAGE/HYSTEROSCOPY WITH THERMACHOICE ABLATION;  Surgeon: John V Ferguson, MD;  Location: [facility];  Service: Gynecology;  Laterality: N/A;  Total Therapy Time:8:54 minutes Temperature:87 degrees   ENDOMETRIAL ABLATION  12/14   ESOPHAGOGASTRODUODENOSCOPY N/A 12/27/2012   Dr. Fields: stricture at GE junction s/p dilation, non-erosive   gastritis, normal duodenum.    TOTAL KNEE ARTHROPLASTY Right 12/15/2014   Procedure: TOTAL KNEE ARTHROPLASTY;  Surgeon: Meredith Pel, MD;  Location: [place];  Service: Orthopedics;  Laterality: Right;   TUBAL LIGATION     along with c-section    Allergies:  Allergies  Allergen Reactions   Doxycycline Nausea And Vomiting    Patient states " I felt like I was dying".   Penicillins Nausea And Vomiting    Has patient had a PCN reaction causing immediate rash, facial/tongue/throat  swelling, SOB or lightheadedness with hypotension: unknown Has patient had a PCN reaction causing severe rash involving mucus membranes or skin necrosis: unknown Has patient had a PCN reaction that required hospitalization: unknown Has patient had a PCN reaction occurring within the last 10 years: yes If all of the above answers are "NO", then may proceed with Cephalosporin use.    Sulfa Antibiotics Swelling   Xanax [Alprazolam] Other (See Comments)    Increased in anger when taking the Xanax.     Family History:  Family History  Problem Relation Age of Onset   Hypertension Mother    Diabetes Mother    Thyroid disease Mother    COPD Mother    Arthritis Mother    Depression Mother    Mental illness Mother    Stroke Mother    Prostate cancer Father        unsure primary    Colon cancer Father    Diabetes Maternal Grandmother    Diabetes Paternal Grandmother    Cancer Sister    Heart attack Brother    Colon cancer Brother    Diabetes Sister    Depression Sister    Mental illness Sister    Colon polyps Sister     Social History:  Social History   Tobacco Use   Smoking status: Every Day    Packs/day: 0.50    Years: 24.00    Pack years: 12.00    Types: Cigarettes    Start date: 11/26/1983   Smokeless tobacco: Never   Tobacco comments:    quit 3 times for a total of 8 years since 1985  Vaping Use   Vaping Use: Every day  Substance Use Topics   Alcohol use: No    Comment: socially just mixed drinks    Drug use: No    Review of symptoms:  Constitutional:  Negative for unexplained weight loss, night sweats, fever, chills ENT:  Negative for nose bleeds, sinus pain, painful swallowing CV:  Negative for chest pain, shortness of breath, exercise intolerance, palpitations, loss of consciousness Resp:  Negative for cough, wheezing, shortness of breath GI:  Negative for vomiting, diarrhea, bloody stools GU:  Positives noted in HPI; otherwise negative for urinary  incontinence Neuro:  Negative for seizures, poor balance, limb weakness, slurred speech Psych:  Negative for lack of energy, depression, anxiety Endocrine:  Negative for polydipsia, polyuria, symptoms of hypoglycemia (dizziness, hunger, sweating) Hematologic:  Negative for anemia, purpura, petechia, prolonged or excessive bleeding, use of anticoagulants  Allergic:  Negative for difficulty breathing or choking as a result of exposure to anything; no shellfish allergy; no allergic response (rash/itch) to materials, foods  Physical exam: BP 113/79   Pulse 88   Wt 196 lb (88.9 kg)   LMP 01/23/2016   BMI 32.62 kg/m  GENERAL APPEARANCE:  Well appearing, well developed, well nourished, NAD HEENT: Atraumatic, Normocephalic, oropharynx clear. NECK: Supple without lymphadenopathy or thyromegaly. LUNGS: Clear to auscultation bilaterally. HEART:  Regular Rate and Rhythm without murmurs, gallops, or rubs. ABDOMEN: Soft, non-tender, No Masses. EXTREMITIES: Moves all extremities well.  Without clubbing, cyanosis, or edema. NEUROLOGIC:  Alert and oriented x 3, normal gait, CN II-XII grossly intact.  MENTAL STATUS:  Appropriate. BACK:  Non-tender to palpation.  No CVAT SKIN:  Warm, dry and intact.    Results: U/A:  6-10 WBC, 3-10 RBC, pH 5.5

## 2021-01-11 NOTE — Progress Notes (Addendum)
Assessment: 1. Ureteral calculus, right; 2 1 mm calculi, UVJ   2. Nephrolithiasis, right; 6 mm, renal pelvis     Plan: I personally reviewed the CT studies from 12/16/2020 and 01/10/2021.  I discussed these results with the patient in detail today.  Her most recent CT scan shows a 6 to 7 mm calculus in the area of the right renal pelvis as well as two 1 mm calculi in the distal right ureter at the UVJ with dilation of the right ureter.  I think these distal calculi are currently causing her symptoms.  Management options for ureteral calculi discussed including spontaneous passage, shockwave lithotripsy, and ureteroscopic stone manipulation.  Given the small stone size, she has a >90% chance of spontaneous passage.  I did discuss treatment of the right renal stone with shockwave lithotripsy or ureteroscopic stone manipulation and laser lithotripsy.  Risk and benefits of both procedures discussed.  She is interested in pursuing shockwave lithotripsy. KUB today to make sure stone is easily visualized. Continue pain medication as needed. Toradol 30 mg IM given in the office today. Begin tamsulosin 0.4 mg daily to assist with stone passage. Strain urine.  Procedure: The patient will be scheduled for right ESL at [facility].  Surgical request is placed with the surgery schedulers and will be scheduled at the patient's/family request. Informed consent is given as documented below. Anesthesia:  local  The patient does not have sleep apnea, history of MRSA, history of VRE, history of cardiac device requiring special anesthetic needs. Patient is stable and considered clear for surgical in an outpatient ambulatory surgery setting as well as patient hospital setting.  Consent for Operation or Procedure: Provider Certification I hereby certify that the nature, purpose, benefits, usual and most frequent risks of, and alternatives to, the operation or procedure have been explained to the patient (or  person authorized to sign for the patient) either by me as responsible physician or by the provider who is to perform the operation or procedure. Time spent such that the patient/family has had an opportunity to ask questions, and that those questions have been answered. The patient or the patient's representative has been advised that selected tasks may be performed by assistants to the primary health care provider(s). I believe that the patient (or person authorized to sign for the patient) understands what has been explained, and has consented to the operation or procedure. No guarantees were implied or made.  01/13/21 Addendum: The patient's insurance would not approve shockwave lithotripsy.  Consequently, she has elected to proceed with right ureteroscopic laser lithotripsy for management of the right renal calculus.  Risks, benefits, alternatives were discussed with the patient in detail.  Potential risks including, but not limited to, infection; bleeding;  injury to urethra, bladder, or ureter; possible need of other treatments; possible failure to remove the calculus; ureteral  stricture formation; cardiac, pulmonary, cerebrovascular events; and anesthetic complications were discussed.  The patient understands and wishes to proceed.  Procedure: The patient will be scheduled for cystoscopy, bilateral retrograde pyelograms, right ureteroscopy, laser lithotripsy, insertion of right ureteral stent at [facility].  Surgical request is placed with the surgery schedulers and will be scheduled at the patient's/family request. Informed consent is given as documented below. Anesthesia: General  The patient does not have sleep apnea, history of MRSA, history of VRE, history of cardiac device requiring special anesthetic needs. Patient is stable and considered clear for surgical in an outpatient ambulatory surgery setting as well as patient hospital  setting.  Consent for Operation or Procedure: Provider  Certification I hereby certify that the nature, purpose, benefits, usual and most frequent risks of, and alternatives to, the operation or procedure have been explained to the patient (or person authorized to sign for the patient) either by me as responsible physician or by the provider who is to perform the operation or procedure. Time spent such that the patient/family has had an opportunity to ask questions, and that those questions have been answered. The patient or the patient's representative has been advised that selected tasks may be performed by assistants to the primary health care provider(s). I believe that the patient (or person authorized to sign for the patient) understands what has been explained, and has consented to the operation or procedure. No guarantees were implied or made.   Chief Complaint:  Chief Complaint  Patient presents with   Nephrolithiasis    History of Present Illness:  [name] is a 50 y.o. year old female who is seen in consultation from Kathyrn Drown, MD for evaluation of ureteral calculi and right renal calculus.  She was evaluated with a CT scan on 12/16/2020 for gross hematuria and low back pain.  CT showed a 6 mm nonobstructing calculus in the right renal pelvis.  She has had intermittent pain since that time.  She had worsening pain yesterday and was seen in the emergency room.  She reported associated nausea and vomiting.  Urinalysis showed >50 RBCs.  Repeat CT scan showed two 1 mm calculi at the right UVJ, mild right-sided hydronephrosis and a 7 mm calculus in the right renal pelvis. She continues to have gross hematuria, frequency, dysuria, and right flank and lower abdominal pain.  Her symptoms have improved slightly today.  She is not aware of passing any stones but has not been straining her urine.  No fevers or chills.  She is having some nausea without vomiting.  No prior history of kidney stones.  Past Medical History:  Past Medical History:   Diagnosis Date   Anxiety    Arthritis    Knee   Chronic back pain    Chronic knee pain    Family history of anesthesia complication    Brother and Mother woke up during surgery; Heard and felt could not speak   GERD (gastroesophageal reflux disease)    Headache    stress   HTN (hypertension), benign 01/22/2019   IBS (irritable bowel syndrome)    Menorrhagia 09/09/2012    Past Surgical History:  Past Surgical History:  Procedure Laterality Date   BIOPSY  02/01/2016   Procedure: BIOPSY;  Surgeon: Danie Binder, MD;  [facility];  Service: Endoscopy;;  random colon biopsy   CESAREAN SECTION     X2    CHOLECYSTECTOMY N/A 01/17/2013   Procedure: LAPAROSCOPIC CHOLECYSTECTOMY;  Surgeon: Scherry Ran, MD;  Location: [facility];  Service: General;  Laterality: N/A;   COLONOSCOPY WITH PROPOFOL N/A 02/01/2016   Procedure: COLONOSCOPY WITH PROPOFOL;  Surgeon: Danie Binder, MD;  [facility];  Service: Endoscopy;  Laterality: N/A;  11:45 am   DILITATION & CURRETTAGE/HYSTROSCOPY WITH THERMACHOICE ABLATION N/A 02/25/2013   Procedure: DILATATION & CURETTAGE/HYSTEROSCOPY WITH THERMACHOICE ABLATION;  Surgeon: Jonnie Kind, MD;  Location: [facility];  Service: Gynecology;  Laterality: N/A;  Total Therapy Time:8:54 minutes Temperature:87 degrees   ENDOMETRIAL ABLATION  12/14   ESOPHAGOGASTRODUODENOSCOPY N/A 12/27/2012   Dr. Oneida Alar: stricture at [place] junction s/p dilation, non-erosive  gastritis, normal duodenum.    TOTAL KNEE ARTHROPLASTY Right 12/15/2014   Procedure: TOTAL KNEE ARTHROPLASTY;  Surgeon: Meredith Pel, MD;  Location: [place];  Service: Orthopedics;  Laterality: Right;   TUBAL LIGATION     along with c-section    Allergies:  Allergies  Allergen Reactions   Doxycycline Nausea And Vomiting    Patient states " I felt like I was dying".   Penicillins Nausea And Vomiting    Has patient had a PCN reaction causing immediate rash, facial/tongue/throat  swelling, SOB or lightheadedness with hypotension: unknown Has patient had a PCN reaction causing severe rash involving mucus membranes or skin necrosis: unknown Has patient had a PCN reaction that required hospitalization: unknown Has patient had a PCN reaction occurring within the last 10 years: yes If all of the above answers are "NO", then may proceed with Cephalosporin use.    Sulfa Antibiotics Swelling   Xanax [Alprazolam] Other (See Comments)    Increased in anger when taking the Xanax.     Family History:  Family History  Problem Relation Age of Onset   Hypertension Mother    Diabetes Mother    Thyroid disease Mother    COPD Mother    Arthritis Mother    Depression Mother    Mental illness Mother    Stroke Mother    Prostate cancer Father        unsure primary    Colon cancer Father    Diabetes Maternal Grandmother    Diabetes Paternal Grandmother    Cancer Sister    Heart attack Brother    Colon cancer Brother    Diabetes Sister    Depression Sister    Mental illness Sister    Colon polyps Sister     Social History:  Social History   Tobacco Use   Smoking status: Every Day    Packs/day: 0.50    Years: 24.00    Pack years: 12.00    Types: Cigarettes    Start date: 11/26/1983   Smokeless tobacco: Never   Tobacco comments:    quit 3 times for a total of 8 years since 1985  Vaping Use   Vaping Use: Every day  Substance Use Topics   Alcohol use: No    Comment: socially just mixed drinks    Drug use: No    Review of symptoms:  Constitutional:  Negative for unexplained weight loss, night sweats, fever, chills ENT:  Negative for nose bleeds, sinus pain, painful swallowing CV:  Negative for chest pain, shortness of breath, exercise intolerance, palpitations, loss of consciousness Resp:  Negative for cough, wheezing, shortness of breath GI:  Negative for vomiting, diarrhea, bloody stools GU:  Positives noted in HPI; otherwise negative for urinary  incontinence Neuro:  Negative for seizures, poor balance, limb weakness, slurred speech Psych:  Negative for lack of energy, depression, anxiety Endocrine:  Negative for polydipsia, polyuria, symptoms of hypoglycemia (dizziness, hunger, sweating) Hematologic:  Negative for anemia, purpura, petechia, prolonged or excessive bleeding, use of anticoagulants  Allergic:  Negative for difficulty breathing or choking as a result of exposure to anything; no shellfish allergy; no allergic response (rash/itch) to materials, foods  Physical exam: BP 113/79   Pulse 88   Wt 196 lb (88.9 kg)   LMP 01/23/2016   BMI 32.62 kg/m  GENERAL APPEARANCE:  Well appearing, well developed, well nourished, NAD HEENT: Atraumatic, Normocephalic, oropharynx clear. NECK: Supple without lymphadenopathy or thyromegaly. LUNGS: Clear to auscultation bilaterally. HEART:  Regular Rate and Rhythm without murmurs, gallops, or rubs. ABDOMEN: Soft, non-tender, No Masses. EXTREMITIES: Moves all extremities well.  Without clubbing, cyanosis, or edema. NEUROLOGIC:  Alert and oriented x 3, normal gait, CN II-XII grossly intact.  MENTAL STATUS:  Appropriate. BACK:  Non-tender to palpation.  No CVAT SKIN:  Warm, dry and intact.    Results: U/A:  6-10 WBC, 3-10 RBC, pH 5.5

## 2021-01-12 ENCOUNTER — Telehealth: Payer: Self-pay

## 2021-01-12 LAB — MICROSCOPIC EXAMINATION

## 2021-01-12 LAB — URINALYSIS, ROUTINE W REFLEX MICROSCOPIC
Bilirubin, UA: NEGATIVE
Glucose, UA: NEGATIVE
Ketones, UA: NEGATIVE
Nitrite, UA: NEGATIVE
Specific Gravity, UA: >1.03 — ABNORMAL HIGH (ref 1.005–1.030)
Urobilinogen, Ur: 0.2 mg/dL (ref 0.2–1.0)
pH, UA: 5.5 (ref 5.0–7.5)

## 2021-01-12 NOTE — Telephone Encounter (Signed)
Spoke with Britta Mccreedy at [employer]. [employer] does not participate with [facility] and has no out of network benefits.  Patient informed of this with insurance and wishes to proceed with ureteroscopy.  Message sent to MD

## 2021-01-12 NOTE — Telephone Encounter (Signed)
She would like to do in December. 1st Wednesday. She will need to arrange things with her job.

## 2021-01-13 NOTE — Telephone Encounter (Signed)
Scheduled for December 7th

## 2021-01-17 ENCOUNTER — Encounter: Payer: Self-pay | Admitting: Adult Health

## 2021-01-17 ENCOUNTER — Other Ambulatory Visit: Payer: Self-pay

## 2021-01-17 ENCOUNTER — Ambulatory Visit (INDEPENDENT_AMBULATORY_CARE_PROVIDER_SITE_OTHER): Payer: 59 | Admitting: Adult Health

## 2021-01-17 VITALS — BP 122/80 | HR 82 | Ht 65.0 in | Wt 203.4 lb

## 2021-01-17 DIAGNOSIS — R52 Pain, unspecified: Secondary | ICD-10-CM | POA: Insufficient documentation

## 2021-01-17 DIAGNOSIS — Z79890 Hormone replacement therapy (postmenopausal): Secondary | ICD-10-CM | POA: Diagnosis not present

## 2021-01-17 DIAGNOSIS — Z6833 Body mass index (BMI) 33.0-33.9, adult: Secondary | ICD-10-CM

## 2021-01-17 DIAGNOSIS — Z713 Dietary counseling and surveillance: Secondary | ICD-10-CM | POA: Diagnosis not present

## 2021-01-17 MED ORDER — PHENTERMINE HCL 37.5 MG PO TABS: 37.5000 mg | ORAL_TABLET | Freq: Every day | ORAL | 0 refills | Status: DC

## 2021-01-17 NOTE — Progress Notes (Signed)
  Subjective:     Patient ID: [name], female   DOB: 08/19/1970, 50 y.o.   MRN: [id number]  HPI [name] is a 50 year old white female,divorced, G2P2 back in follow up after starting estrace 1 mg and Prometrium 200 mg in August for hot flashes and night sweats, which have resolved, and fatigue,weight gain and body aches, and not sleeping well, which persists. Has kidney stone and having surgery 02/02/21. She wants to try phentermine, did it years ago with success.  PCP is [facility] [facility].  Lab Results  Component Value Date   DIAGPAP  10/18/2020    - Negative for intraepithelial lesion or malignancy (NILM)   HPVHIGH Negative 10/18/2020    Review of Systems +Can't lose weight  +Body aches +Not sleeping well,tired Hot flashes and night sweats have resolved Reviewed past medical,surgical, social and family history. Reviewed medications and allergies.     Objective:   Physical Exam BP 122/80 (BP Location: Right Arm, Patient Position: Sitting, Cuff Size: Normal)   Pulse 82   Ht 5\' 5"  (1.651 m)   Wt 203 lb 6.4 oz (92.3 kg)   LMP 01/23/2016   BMI 33.85 kg/m  Skin warm and dry.Lungs: clear to ausculation bilaterally. Cardiovascular: regular rate and rhythm.    Fall risk is low  Upstream - 01/17/21 0859       Pregnancy Intention Screening   Does the patient want to become pregnant in the next year? N/A    Does the patient's partner want to become pregnant in the next year? N/A    Would the patient like to discuss contraceptive options today? N/A      Contraception Wrap Up   Current Method Female Sterilization    End Method Female Sterilization    Contraception Counseling Provided No             Assessment:     1. Hormone replacement therapy (HRT) Continue estrace 1 mg and Prometrium 200 mg, has refills    2. Weight loss counseling, encounter for Will rx phentermine, but do not start til after surgery Meds ordered this encounter  Medications   phentermine (ADIPEX-P)  37.5 MG tablet    Sig: Take 1 tablet (37.5 mg total) by mouth daily before breakfast. Take 1 daily    Dispense:  30 tablet    Refill:  0    Order Specific Question:   Supervising Provider    Answer:   01/19/21 [2510]   Push water Decrease portions  3. Body aches Hoping weight loss will help   4. Body mass index 33.0-33.9, adult     Plan:     Follow up in 6 weeks for weight and BP check

## 2021-01-28 ENCOUNTER — Ambulatory Visit: Payer: 59 | Admitting: Nurse Practitioner

## 2021-01-28 NOTE — Patient Instructions (Signed)
[name]  01/28/2021     @PREFPERIOPPHARMACY @   Your procedure is scheduled on  02/02/2021.   Report to 14/08/2020 at  [phone]  A.M.   Call this number if you have problems the morning of surgery:  [phone]   Remember:  Do not eat or drink after midnight.      Take these medicines the morning of surgery with A SIP OF WATER            amlodipine, celexa, hydrocodone (if needed), protonix, propranolol.     Do not wear jewelry, make-up or nail polish.  Do not wear lotions, powders, or perfumes, or deodorant.  Do not shave 48 hours prior to surgery.  Men may shave face and neck.  Do not bring valuables to the hospital.  [facility] is not responsible for any belongings or valuables.  Contacts, dentures or bridgework may not be worn into surgery.  Leave your suitcase in the car.  After surgery it may be brought to your room.  For patients admitted to the hospital, discharge time will be determined by your treatment team.  Patients discharged the day of surgery will not be allowed to drive home and must have someone with them for 24 hours.    Special instructions:   DO NOT smoke tobacco or vape for 24 hours before your procedure.  Please read over the following fact sheets that you were given. Coughing and Deep Breathing, Surgical Site Infection Prevention, Anesthesia Post-op Instructions, and Care and Recovery After Surgery      Ureteral Stent Implantation, Care After This sheet gives you information about how to care for yourself after your procedure. Your health care provider may also give you more specific instructions. If you have problems or questions, contact your health care provider. What can I expect after the procedure? After the procedure, it is common to have: Nausea. Mild pain when you urinate. You may feel this pain in your lower back or lower abdomen. The pain should stop within a few minutes after you urinate. This may last for up to 1  week. A small amount of blood in your urine for several days. Follow these instructions at home: Medicines Take over-the-counter and prescription medicines only as told by your health care provider. If you were prescribed an antibiotic medicine, take it as told by your health care provider. Do not stop taking the antibiotic even if you start to feel better. Do not drive for 24 hours if you were given a sedative during your procedure. Ask your health care provider if the medicine prescribed to you requires you to avoid driving or using heavy machinery. Activity Rest as told by your health care provider. Avoid sitting for a long time without moving. Get up to take short walks every 1-2 hours. This is important to improve blood flow and breathing. Ask for help if you feel weak or unsteady. Return to your normal activities as told by your health care provider. Ask your health care provider what activities are safe for you. General instructions  Watch for any blood in your urine. Call your health care provider if the amount of blood in your urine increases. If you have a catheter: Follow instructions from your health care provider about taking care of your catheter and collection bag. Do not take baths, swim, or use a hot tub until your health care provider approves. Ask your health care provider if you may  take showers. You may only be allowed to take sponge baths. Drink enough fluid to keep your urine pale yellow. Do not use any products that contain nicotine or tobacco, such as cigarettes, e-cigarettes, and chewing tobacco. These can delay healing after surgery. If you need help quitting, ask your health care provider. Keep all follow-up visits as told by your health care provider. This is important. Contact a health care provider if: You have pain that gets worse or does not get better with medicine, especially pain when you urinate. You have difficulty urinating. You feel nauseous or you  vomit repeatedly during a period of more than 2 days after the procedure. Get help right away if: Your urine is dark red or has blood clots in it. You are leaking urine (have incontinence). The end of the stent comes out of your urethra. You cannot urinate. You have sudden, sharp, or severe pain in your abdomen or lower back. You have a fever. You have swelling or pain in your legs. You have difficulty breathing. Summary After the procedure, it is common to have mild pain when you urinate that goes away within a few minutes after you urinate. This may last for up to 1 week. Watch for any blood in your urine. Call your health care provider if the amount of blood in your urine increases. Take over-the-counter and prescription medicines only as told by your health care provider. Drink enough fluid to keep your urine pale yellow. This information is not intended to replace advice given to you by your health care provider. Make sure you discuss any questions you have with your health care provider. Document Revised: 11/20/2017 Document Reviewed: 11/21/2017 Elsevier Patient Education  2022 [employer] General Anesthesia, Adult, Care After This sheet gives you information about how to care for yourself after your procedure. Your health care provider may also give you more specific instructions. If you have problems or questions, contact your health care provider. What can I expect after the procedure? After the procedure, the following side effects are common: Pain or discomfort at the IV site. Nausea. Vomiting. Sore throat. Trouble concentrating. Feeling cold or chills. Feeling weak or tired. Sleepiness and fatigue. Soreness and body aches. These side effects can affect parts of the body that were not involved in surgery. Follow these instructions at home: For the time period you were told by your health care provider:  Rest. Do not participate in activities where you could fall or  become injured. Do not drive or use machinery. Do not drink alcohol. Do not take sleeping pills or medicines that cause drowsiness. Do not make important decisions or sign legal documents. Do not take care of children on your own. Eating and drinking Follow any instructions from your health care provider about eating or drinking restrictions. When you feel hungry, start by eating small amounts of foods that are soft and easy to digest (bland), such as toast. Gradually return to your regular diet. Drink enough fluid to keep your urine pale yellow. If you vomit, rehydrate by drinking water, juice, or clear broth. General instructions If you have sleep apnea, surgery and certain medicines can increase your risk for breathing problems. Follow instructions from your health care provider about wearing your sleep device: Anytime you are sleeping, including during daytime naps. While taking prescription pain medicines, sleeping medicines, or medicines that make you drowsy. Have a responsible adult stay with you for the time you are told. It is important to have someone help  care for you until you are awake and alert. Return to your normal activities as told by your health care provider. Ask your health care provider what activities are safe for you. Take over-the-counter and prescription medicines only as told by your health care provider. If you smoke, do not smoke without supervision. Keep all follow-up visits as told by your health care provider. This is important. Contact a health care provider if: You have nausea or vomiting that does not get better with medicine. You cannot eat or drink without vomiting. You have pain that does not get better with medicine. You are unable to pass urine. You develop a skin rash. You have a fever. You have redness around your IV site that gets worse. Get help right away if: You have difficulty breathing. You have chest pain. You have blood in your urine or  stool, or you vomit blood. Summary After the procedure, it is common to have a sore throat or nausea. It is also common to feel tired. Have a responsible adult stay with you for the time you are told. It is important to have someone help care for you until you are awake and alert. When you feel hungry, start by eating small amounts of foods that are soft and easy to digest (bland), such as toast. Gradually return to your regular diet. Drink enough fluid to keep your urine pale yellow. Return to your normal activities as told by your health care provider. Ask your health care provider what activities are safe for you. This information is not intended to replace advice given to you by your health care provider. Make sure you discuss any questions you have with your health care provider. Document Revised: 10/30/2019 Document Reviewed: 05/29/2019 Elsevier Patient Education  2022 Elsevier Inc. How to Use Chlorhexidine for Bathing Chlorhexidine gluconate (CHG) is a germ-killing (antiseptic) solution that is used to clean the skin. It can get rid of the bacteria that normally live on the skin and can keep them away for about 24 hours. To clean your skin with CHG, you may be given: A CHG solution to use in the shower or as part of a sponge bath. A prepackaged cloth that contains CHG. Cleaning your skin with CHG may help lower the risk for infection: While you are staying in the intensive care unit of the hospital. If you have a vascular access, such as a central line, to provide short-term or long-term access to your veins. If you have a catheter to drain urine from your bladder. If you are on a ventilator. A ventilator is a machine that helps you breathe by moving air in and out of your lungs. After surgery. What are the risks? Risks of using CHG include: A skin reaction. Hearing loss, if CHG gets in your ears and you have a perforated eardrum. Eye injury, if CHG gets in your eyes and is not rinsed  out. The CHG product catching fire. Make sure that you avoid smoking and flames after applying CHG to your skin. Do not use CHG: If you have a chlorhexidine allergy or have previously reacted to chlorhexidine. On babies younger than 86 months of age. How to use CHG solution Use CHG only as told by your health care provider, and follow the instructions on the label. Use the full amount of CHG as directed. Usually, this is one bottle. During a shower Follow these steps when using CHG solution during a shower (unless your health care provider gives you different instructions):  Start the shower. Use your normal soap and shampoo to wash your face and hair. Turn off the shower or move out of the shower stream. Pour the CHG onto a clean washcloth. Do not use any type of brush or rough-edged sponge. Starting at your neck, lather your body down to your toes. Make sure you follow these instructions: If you will be having surgery, pay special attention to the part of your body where you will be having surgery. Scrub this area for at least 1 minute. Do not use CHG on your head or face. If the solution gets into your ears or eyes, rinse them well with water. Avoid your genital area. Avoid any areas of skin that have broken skin, cuts, or scrapes. Scrub your back and under your arms. Make sure to wash skin folds. Let the lather sit on your skin for 1-2 minutes or as long as told by your health care provider. Thoroughly rinse your entire body in the shower. Make sure that all body creases and crevices are rinsed well. Dry off with a clean towel. Do not put any substances on your body afterward--such as powder, lotion, or perfume--unless you are told to do so by your health care provider. Only use lotions that are recommended by the manufacturer. Put on clean clothes or pajamas. If it is the night before your surgery, sleep in clean sheets.  During a sponge bath Follow these steps when using CHG solution  during a sponge bath (unless your health care provider gives you different instructions): Use your normal soap and shampoo to wash your face and hair. Pour the CHG onto a clean washcloth. Starting at your neck, lather your body down to your toes. Make sure you follow these instructions: If you will be having surgery, pay special attention to the part of your body where you will be having surgery. Scrub this area for at least 1 minute. Do not use CHG on your head or face. If the solution gets into your ears or eyes, rinse them well with water. Avoid your genital area. Avoid any areas of skin that have broken skin, cuts, or scrapes. Scrub your back and under your arms. Make sure to wash skin folds. Let the lather sit on your skin for 1-2 minutes or as long as told by your health care provider. Using a different clean, wet washcloth, thoroughly rinse your entire body. Make sure that all body creases and crevices are rinsed well. Dry off with a clean towel. Do not put any substances on your body afterward--such as powder, lotion, or perfume--unless you are told to do so by your health care provider. Only use lotions that are recommended by the manufacturer. Put on clean clothes or pajamas. If it is the night before your surgery, sleep in clean sheets. How to use CHG prepackaged cloths Only use CHG cloths as told by your health care provider, and follow the instructions on the label. Use the CHG cloth on clean, dry skin. Do not use the CHG cloth on your head or face unless your health care provider tells you to. When washing with the CHG cloth: Avoid your genital area. Avoid any areas of skin that have broken skin, cuts, or scrapes. Before surgery Follow these steps when using a CHG cloth to clean before surgery (unless your health care provider gives you different instructions): Using the CHG cloth, vigorously scrub the part of your body where you will be having surgery. Scrub using a  back-and-forth motion  for 3 minutes. The area on your body should be completely wet with CHG when you are done scrubbing. Do not rinse. Discard the cloth and let the area air-dry. Do not put any substances on the area afterward, such as powder, lotion, or perfume. Put on clean clothes or pajamas. If it is the night before your surgery, sleep in clean sheets.  For general bathing Follow these steps when using CHG cloths for general bathing (unless your health care provider gives you different instructions). Use a separate CHG cloth for each area of your body. Make sure you wash between any folds of skin and between your fingers and toes. Wash your body in the following order, switching to a new cloth after each step: The front of your neck, shoulders, and chest. Both of your arms, under your arms, and your hands. Your stomach and groin area, avoiding the genitals. Your right leg and foot. Your left leg and foot. The back of your neck, your back, and your buttocks. Do not rinse. Discard the cloth and let the area air-dry. Do not put any substances on your body afterward--such as powder, lotion, or perfume--unless you are told to do so by your health care provider. Only use lotions that are recommended by the manufacturer. Put on clean clothes or pajamas. Contact a health care provider if: Your skin gets irritated after scrubbing. You have questions about using your solution or cloth. You swallow any chlorhexidine. Call your local poison control center ([phone] in the [place] Get help right away if: Your eyes itch badly, or they become very red or swollen. Your skin itches badly and is red or swollen. Your hearing changes. You have trouble seeing. You have swelling or tingling in your mouth or throat. You have trouble breathing. These symptoms may represent a serious problem that is an emergency. Do not wait to see if the symptoms will go away. Get medical help right away. Call your  local emergency services (911 in the [place] Do not drive yourself to the hospital. Summary Chlorhexidine gluconate (CHG) is a germ-killing (antiseptic) solution that is used to clean the skin. Cleaning your skin with CHG may help to lower your risk for infection. You may be given CHG to use for bathing. It may be in a bottle or in a prepackaged cloth to use on your skin. Carefully follow your health care provider's instructions and the instructions on the product label. Do not use CHG if you have a chlorhexidine allergy. Contact your health care provider if your skin gets irritated after scrubbing. This information is not intended to replace advice given to you by your health care provider. Make sure you discuss any questions you have with your health care provider. Document Revised: 04/26/2020 Document Reviewed: 04/26/2020 Elsevier Patient Education  2022 [employer].

## 2021-01-30 ENCOUNTER — Other Ambulatory Visit: Payer: Self-pay | Admitting: Nurse Practitioner

## 2021-01-31 ENCOUNTER — Encounter (HOSPITAL_COMMUNITY): Payer: Self-pay

## 2021-01-31 ENCOUNTER — Other Ambulatory Visit: Payer: Self-pay

## 2021-01-31 ENCOUNTER — Encounter (HOSPITAL_COMMUNITY)
Admission: RE | Admit: 2021-01-31 | Discharge: 2021-01-31 | Disposition: A | Discharge status: Home / Self Care | Payer: 59 | Source: Ambulatory Visit | Attending: Urology | Admitting: Urology

## 2021-01-31 VITALS — BP 113/82 | HR 69 | Temp 97.8°F | Resp 16 | Ht 65.0 in | Wt 200.0 lb

## 2021-01-31 DIAGNOSIS — E876 Hypokalemia: Secondary | ICD-10-CM | POA: Insufficient documentation

## 2021-01-31 DIAGNOSIS — Z01818 Encounter for other preprocedural examination: Secondary | ICD-10-CM | POA: Diagnosis not present

## 2021-01-31 LAB — BASIC METABOLIC PANEL
Anion gap: 6 (ref 5–15)
BUN: 21 mg/dL — ABNORMAL HIGH (ref 6–20)
CO2: 24 mmol/L (ref 22–32)
Calcium: 9.4 mg/dL (ref 8.9–10.3)
Chloride: 109 mmol/L (ref 98–111)
Creatinine, Ser: 0.73 mg/dL (ref 0.44–1.00)
GFR, Estimated: >60 mL/min (ref >60)
Glucose, Bld: 89 mg/dL (ref 70–99)
Potassium: 3.7 mmol/L (ref 3.5–5.1)
Sodium: 139 mmol/L (ref 135–145)

## 2021-02-02 ENCOUNTER — Encounter (HOSPITAL_COMMUNITY): Payer: Self-pay | Admitting: Urology

## 2021-02-02 ENCOUNTER — Other Ambulatory Visit: Payer: Self-pay

## 2021-02-02 ENCOUNTER — Ambulatory Visit (HOSPITAL_COMMUNITY)
Admission: RE | Admit: 2021-02-02 | Discharge: 2021-02-02 | Disposition: A | Discharge status: Home / Self Care | Payer: 59 | Source: Ambulatory Visit | Attending: Urology | Admitting: Urology

## 2021-02-02 ENCOUNTER — Ambulatory Visit (HOSPITAL_COMMUNITY): Payer: 59

## 2021-02-02 ENCOUNTER — Ambulatory Visit (HOSPITAL_COMMUNITY): Payer: 59 | Admitting: Anesthesiology

## 2021-02-02 ENCOUNTER — Encounter (HOSPITAL_COMMUNITY)
Admission: RE | Disposition: A | Discharge status: Home / Self Care | Payer: Self-pay | Source: Ambulatory Visit | Attending: Urology

## 2021-02-02 DIAGNOSIS — K219 Gastro-esophageal reflux disease without esophagitis: Secondary | ICD-10-CM | POA: Insufficient documentation

## 2021-02-02 DIAGNOSIS — I10 Essential (primary) hypertension: Secondary | ICD-10-CM | POA: Insufficient documentation

## 2021-02-02 DIAGNOSIS — Z79899 Other long term (current) drug therapy: Secondary | ICD-10-CM | POA: Insufficient documentation

## 2021-02-02 DIAGNOSIS — F17200 Nicotine dependence, unspecified, uncomplicated: Secondary | ICD-10-CM | POA: Diagnosis not present

## 2021-02-02 DIAGNOSIS — N132 Hydronephrosis with renal and ureteral calculous obstruction: Secondary | ICD-10-CM | POA: Diagnosis not present

## 2021-02-02 DIAGNOSIS — N200 Calculus of kidney: Secondary | ICD-10-CM | POA: Diagnosis not present

## 2021-02-02 HISTORY — PX: CYSTOSCOPY WITH RETROGRADE PYELOGRAM, URETEROSCOPY AND STENT PLACEMENT: SHX5789

## 2021-02-02 HISTORY — PX: HOLMIUM LASER APPLICATION: SHX5852

## 2021-02-02 SURGERY — CYSTOURETEROSCOPY, WITH RETROGRADE PYELOGRAM AND STENT INSERTION
Anesthesia: General | Laterality: Right

## 2021-02-02 MED ORDER — CHLORHEXIDINE GLUCONATE 0.12 % MT SOLN
15.0000 mL | Freq: Once | OROMUCOSAL | Status: AC
  Administered 2021-02-02: 15 mL via OROMUCOSAL

## 2021-02-02 MED ORDER — LACTATED RINGERS IV SOLN: INTRAVENOUS | Status: DC

## 2021-02-02 MED ORDER — LIDOCAINE HCL (PF) 2 % IJ SOLN
INTRAMUSCULAR | Status: AC
  Filled 2021-02-02: qty 5

## 2021-02-02 MED ORDER — ONDANSETRON HCL 4 MG/2ML IJ SOLN: 4.0000 mg | Freq: Once | INTRAMUSCULAR | Status: DC | PRN

## 2021-02-02 MED ORDER — LIDOCAINE HCL URETHRAL/MUCOSAL 2 % EX GEL
CUTANEOUS | Status: AC
  Filled 2021-02-02: qty 10

## 2021-02-02 MED ORDER — CIPROFLOXACIN IN D5W 400 MG/200ML IV SOLN
400.0000 mg | INTRAVENOUS | Status: AC
  Administered 2021-02-02: 400 mg via INTRAVENOUS
  Filled 2021-02-02: qty 200

## 2021-02-02 MED ORDER — PHENAZOPYRIDINE HCL 200 MG PO TABS: 200.0000 mg | ORAL_TABLET | Freq: Three times a day (TID) | ORAL | 0 refills | Status: DC | PRN

## 2021-02-02 MED ORDER — DEXAMETHASONE SODIUM PHOSPHATE 10 MG/ML IJ SOLN
INTRAMUSCULAR | Status: DC | PRN
  Administered 2021-02-02: 10 mg via INTRAVENOUS

## 2021-02-02 MED ORDER — ORAL CARE MOUTH RINSE: 15.0000 mL | Freq: Once | OROMUCOSAL | Status: AC

## 2021-02-02 MED ORDER — DIATRIZOATE MEGLUMINE 30 % UR SOLN
URETHRAL | Status: DC | PRN
  Administered 2021-02-02: 14 mL via URETHRAL

## 2021-02-02 MED ORDER — DIATRIZOATE MEGLUMINE 30 % UR SOLN
URETHRAL | Status: AC
  Filled 2021-02-02: qty 100

## 2021-02-02 MED ORDER — MIDAZOLAM HCL 2 MG/2ML IJ SOLN
INTRAMUSCULAR | Status: AC
  Filled 2021-02-02: qty 2

## 2021-02-02 MED ORDER — SODIUM CHLORIDE 0.9 % IR SOLN
Status: DC | PRN
  Administered 2021-02-02 (×2): 3000 mL

## 2021-02-02 MED ORDER — LIDOCAINE HCL (CARDIAC) PF 50 MG/5ML IV SOSY
PREFILLED_SYRINGE | INTRAVENOUS | Status: DC | PRN
Start: 2021-02-02 — End: 2021-02-02
  Administered 2021-02-02: 80 mg via INTRAVENOUS

## 2021-02-02 MED ORDER — ONDANSETRON HCL 4 MG/2ML IJ SOLN
INTRAMUSCULAR | Status: AC
  Filled 2021-02-02: qty 2

## 2021-02-02 MED ORDER — NITROFURANTOIN MONOHYD MACRO 100 MG PO CAPS: 100.0000 mg | ORAL_CAPSULE | Freq: Two times a day (BID) | ORAL | 0 refills | Status: AC

## 2021-02-02 MED ORDER — HYDROMORPHONE HCL 1 MG/ML IJ SOLN
0.2500 mg | INTRAMUSCULAR | Status: DC | PRN
  Administered 2021-02-02 (×2): 0.5 mg via INTRAVENOUS
  Filled 2021-02-02 (×2): qty 0.5

## 2021-02-02 MED ORDER — FENTANYL CITRATE (PF) 100 MCG/2ML IJ SOLN
INTRAMUSCULAR | Status: DC | PRN
  Administered 2021-02-02 (×2): 50 ug via INTRAVENOUS

## 2021-02-02 MED ORDER — MEPERIDINE HCL 50 MG/ML IJ SOLN: 6.2500 mg | INTRAMUSCULAR | Status: DC | PRN

## 2021-02-02 MED ORDER — PROPOFOL 10 MG/ML IV BOLUS
INTRAVENOUS | Status: AC
  Filled 2021-02-02: qty 20

## 2021-02-02 MED ORDER — MIDAZOLAM HCL 5 MG/5ML IJ SOLN
INTRAMUSCULAR | Status: DC | PRN
Start: 2021-02-02 — End: 2021-02-02
  Administered 2021-02-02: 2 mg via INTRAVENOUS

## 2021-02-02 MED ORDER — LIDOCAINE HCL URETHRAL/MUCOSAL 2 % EX GEL
CUTANEOUS | Status: DC | PRN
  Administered 2021-02-02: 1

## 2021-02-02 MED ORDER — KETOROLAC TROMETHAMINE 30 MG/ML IJ SOLN
INTRAMUSCULAR | Status: DC | PRN
  Administered 2021-02-02: 15 mg via INTRAVENOUS

## 2021-02-02 MED ORDER — DEXAMETHASONE SODIUM PHOSPHATE 10 MG/ML IJ SOLN
INTRAMUSCULAR | Status: AC
  Filled 2021-02-02: qty 1

## 2021-02-02 MED ORDER — PROPOFOL 10 MG/ML IV BOLUS
INTRAVENOUS | Status: DC | PRN
  Administered 2021-02-02: 180 mg via INTRAVENOUS

## 2021-02-02 MED ORDER — ONDANSETRON HCL 4 MG/2ML IJ SOLN
INTRAMUSCULAR | Status: DC | PRN
  Administered 2021-02-02: 4 mg via INTRAVENOUS

## 2021-02-02 MED ORDER — FENTANYL CITRATE (PF) 250 MCG/5ML IJ SOLN
INTRAMUSCULAR | Status: AC
  Filled 2021-02-02: qty 5

## 2021-02-02 MED ORDER — WATER FOR IRRIGATION, STERILE IR SOLN
Status: DC | PRN
  Administered 2021-02-02: 1000 mL

## 2021-02-02 SURGICAL SUPPLY — 22 items
BAG DRAIN URO TABLE W/ADPT NS (BAG) ×2 IMPLANT
BAG DRN 8 ADPR NS SKTRN CSTL (BAG) ×1
BAG HAMPER (MISCELLANEOUS) ×2 IMPLANT
CATH URET 5FR 28IN OPEN ENDED (CATHETERS) ×2 IMPLANT
CLOTH BEACON ORANGE TIMEOUT ST (SAFETY) ×2 IMPLANT
EXTRACTOR STONE NITINOL NGAGE (UROLOGICAL SUPPLIES) ×2 IMPLANT
GLOVE SURG POLYISO LF SZ8 (GLOVE) ×2 IMPLANT
GLOVE SURG UNDER POLY LF SZ7 (GLOVE) ×4 IMPLANT
GOWN STRL REUS W/TWL LRG LVL3 (GOWN DISPOSABLE) ×2 IMPLANT
GOWN STRL REUS W/TWL XL LVL3 (GOWN DISPOSABLE) ×2 IMPLANT
GUIDEWIRE STR DUAL SENSOR (WIRE) ×2 IMPLANT
IV NS IRRIG 3000ML ARTHROMATIC (IV SOLUTION) ×4 IMPLANT
KIT TURNOVER CYSTO (KITS) ×2 IMPLANT
MANIFOLD NEPTUNE II (INSTRUMENTS) ×2 IMPLANT
PACK CYSTO (CUSTOM PROCEDURE TRAY) ×2 IMPLANT
PAD ARMBOARD 7.5X6 YLW CONV (MISCELLANEOUS) ×2 IMPLANT
STENT URET 6FRX24 CONTOUR (STENTS) ×2 IMPLANT
SYR 10ML LL (SYRINGE) ×2 IMPLANT
TOWEL OR 17X26 4PK STRL BLUE (TOWEL DISPOSABLE) ×2 IMPLANT
TRACTIP FLEXIVA PULS ID 200XHI (Laser) ×1 IMPLANT
TRACTIP FLEXIVA PULSE ID 200 (Laser) ×2
WATER STERILE IRR 500ML POUR (IV SOLUTION) ×2 IMPLANT

## 2021-02-02 NOTE — Interval H&P Note (Signed)
History and Physical Interval Note:  02/02/2021 7:20 AM  [name]  has presented today for surgery, with the diagnosis of right renal calculus.  The various methods of treatment have been discussed with the patient and family. After consideration of risks, benefits and other options for treatment, the patient has consented to  Procedure(s): CYSTOSCOPY WITH BILATERAL RETROGRADE PYELOGRAM, URETEROSCOPY AND RIGHT STENT PLACEMENT (Right) HOLMIUM LASER APPLICATION (Right) as a surgical intervention.  The patient's history has been reviewed, patient examined, no change in status, stable for surgery.  I have reviewed the patient's chart and labs.  Questions were answered to the patient's satisfaction.     Di Kindle

## 2021-02-02 NOTE — Op Note (Signed)
OPERATIVE NOTE   Patient Name: [name]   Date of Procedure: 02/02/21   Preoperative diagnosis:  Right renal calculus  Postoperative diagnosis:  Right renal calculus  Procedure:  Cystoscopy Bilateral retrograde pyelograms Right ureteroscopy with holmium laser lithotripsy Stone manipulation (fragments obtained) Insertion of right ureteral stent (6 F x 24 cm, with tether)  Attending: Primus Bravo, MD  Anesthesia: General with LMA  Estimated blood loss: 5 ml  Fluids: Per anesthesia record  Drains: 69F x 24 cm right ureteral stent, with tether  Specimens: Stone fragments  Antibiotics: Cipro 400 mg IV  Findings: Normal urethra and bladder; normal left retrograde pyelogram; no filling defect seen in right ureter; calcification in area of right renal pelvis consistent with right renal stone.  Indications:  50 year old female resents for surgical management of a right renal pelvic stone.  She was evaluated with a CT scan on 12/16/2020 for gross hematuria and low back pain.  CT showed a 6 mm nonobstructing calculus in the right renal pelvis.  She had worsening of her right-sided pain and was reevaluated with a CT scan on 01/10/2021 which showed a 7 mm calculus in the right renal pelvis, two 1 mm calculi in the right UVJ and mild right-sided hydronephrosis.  Treatment options were discussed with the patient.  She has elected to proceed with cystoscopy, bilateral intermittent pyelograms, right ureteroscopic stone manipulation with laser lithotripsy, and insertion of right ureteral stent.  Risks of procedure discussed with the patient in detail.  She understands wished to proceed as described.  Description of Procedure:  The patient received IV Cipro preoperatively.  After successful induction of a general anesthetic, the patient was placed in the dorsolithotomy position.  The patient's genitalia was prepped draped in sterile fashion.  Direct visualization, a 43 French  rigid to scope was passed through the urethra into the bladder.  The bladder was inspected throughout its entirety.  No mucosal abnormalities were seen.  A normal-appearing trigone was noted.  Bilateral retrograde pyelograms were performed for evaluation of the patient's upper tracts given her history of nephrolithiasis.  Scout film showed a nonspecific bowel gas pattern and no obvious bony abnormalities.  A 6 mm calcification was seen in the area of the right kidney consistent with known stone.  Using a 5 [place] open-ended catheter, contrast was injected into the left ureter.  No filling defect or evidence of obstruction was seen.  A normal left collecting system was appreciated.  In a similar fashion, contrast was injected into the right ureter.  No filling defect or obstruction was noted in the ureter.  The previously noted calcification was confirmed to be within the right renal pelvis.  A sensor guidewire was passed into the right renal pelvis under fluoroscopic guidance.  Ureteroscopy was performed alongside the guidewire for evaluation of the distal ureter.  No stones were noted within the ureter up to the level of the UPJ.  The right ureter was gently dilated with an access sheath.  A Flexor access sheath was placed into the right ureter leaving the guidewire in place.  Flexible ureteroscopy was then performed through the access sheath and alongside the guidewire.  The stone was easily identified in the right renal pelvis.  Using the holmium laser fiber, the stone was fragmented to multiple pieces.  Using a Ngage stone basket, the larger stone fragments were removed.  All remaining stone fragments were very small and felt to be easily passable.  The renal pelvis as well as  all calyces were carefully inspected and no other stones were seen.  The ureter was inspected with removal of the ureteroscope and no evidence of trauma was identified.  A 6 French by 24 cm ureteral stent was placed under cystoscopic  and fluoroscopic guidance.  The tether was left attached and brought through the urethral meatus.  Following stent placement, the bladder was drained and the cystoscope was removed.  Intraurethral lidocaine jelly was placed.  The patient was then extubated taken to the postanesthesia care in stable condition.   Complications: None  Condition: Stable, extubated, transferred to PACU  Plan:  Continue ureteral stent for 7-10 days.

## 2021-02-02 NOTE — Anesthesia Procedure Notes (Addendum)
Procedure Name: LMA Insertion Date/Time: 02/02/2021 7:42 AM Performed by: Moshe Salisbury, CRNA Pre-anesthesia Checklist: Patient identified, Patient being monitored, Emergency Drugs available, Timeout performed and Suction available Patient Re-evaluated:Patient Re-evaluated prior to induction Oxygen Delivery Method: Circle System Utilized Preoxygenation: Pre-oxygenation with 100% oxygen Induction Type: IV induction Ventilation: Mask ventilation without difficulty LMA: LMA inserted LMA Size: 4.0 Number of attempts: 1 Placement Confirmation: positive ETCO2 and breath sounds checked- equal and bilateral

## 2021-02-02 NOTE — Anesthesia Preprocedure Evaluation (Addendum)
Anesthesia Evaluation  Patient identified by MRN, date of birth, ID band Patient awake    Reviewed: Allergy & Precautions, H&P , NPO status , Patient's Chart, lab work & pertinent test results, reviewed documented beta blocker date and time   History of Anesthesia Complications (+) AWARENESS UNDER ANESTHESIA, Family history of anesthesia reaction and history of anesthetic complications  Airway Mallampati: II  TM Distance: >3 FB Neck ROM: Full    Dental  (+) Dental Advisory Given, Missing   Pulmonary Current Smoker,    Pulmonary exam normal breath sounds clear to auscultation       Cardiovascular hypertension, Pt. on medications and Pt. on home beta blockers Normal cardiovascular exam Rhythm:Regular Rate:Normal  31-Jan-2021 10:37:07 [facility] ROUTINE RECORD 14-Nov-1970 (50 yr) Female Caucasian Vent. rate 63 BPM PR interval 156 ms QRS duration 76 ms QT/QTcB 396/405 ms P-R-T axes 60 25 64 Normal sinus rhythm Septal infarct , age undetermined Abnormal ECG Confirmed by Carylon Perches [phone]) on 02/01/2021 6:54:18 AM   Neuro/Psych  Headaches, PSYCHIATRIC DISORDERS Anxiety Depression    GI/Hepatic Neg liver ROS, GERD  Medicated,  Endo/Other  negative endocrine ROS  Renal/GU Renal disease  negative genitourinary   Musculoskeletal  (+) Arthritis ,   Abdominal   Peds negative pediatric ROS (+)  Hematology negative hematology ROS (+)   Anesthesia Other Findings   Reproductive/Obstetrics negative OB ROS                            Anesthesia Physical Anesthesia Plan  ASA: 2  Anesthesia Plan: General   Post-op Pain Management: Dilaudid IV   Induction: Intravenous  PONV Risk Score and Plan: 4 or greater and Ondansetron, Dexamethasone and Midazolam  Airway Management Planned: LMA  Additional Equipment:   Intra-op Plan:   Post-operative Plan: Extubation in  OR  Informed Consent: I have reviewed the patients History and Physical, chart, labs and discussed the procedure including the risks, benefits and alternatives for the proposed anesthesia with the patient or authorized representative who has indicated his/her understanding and acceptance.     Dental advisory given  Plan Discussed with: CRNA and Surgeon  Anesthesia Plan Comments:        Anesthesia Quick Evaluation

## 2021-02-02 NOTE — OR Nursing (Signed)
Family updated intra-operatively per MD request at [phone]

## 2021-02-02 NOTE — Transfer of Care (Signed)
Immediate Anesthesia Transfer of Care Note  Patient: [name]  Procedure(s) Performed: CYSTOSCOPY WITH BILATERAL RETROGRADE PYELOGRAM, URETEROSCOPY AND RIGHT STENT PLACEMENT (Right) HOLMIUM LASER APPLICATION (Right)  Patient Location: PACU  Anesthesia Type:General  Level of Consciousness: awake  Airway & Oxygen Therapy: Patient Spontanous Breathing  Post-op Assessment: Report given to RN  Post vital signs: Reviewed and stable  Last Vitals:  Vitals Value Taken Time  BP 122/82 02/02/21 0856  Temp    Pulse 71 02/02/21 0858  Resp 21 02/02/21 0858  SpO2 97 % 02/02/21 0858  Vitals shown include unvalidated device data.  Last Pain:  Vitals:   02/02/21 0646  TempSrc: Oral  PainSc: 0-No pain         Complications: No notable events documented.

## 2021-02-02 NOTE — Anesthesia Postprocedure Evaluation (Signed)
Anesthesia Post Note  Patient: [name]  Procedure(s) Performed: CYSTOSCOPY WITH BILATERAL RETROGRADE PYELOGRAM, URETEROSCOPY AND RIGHT STENT PLACEMENT (Right) HOLMIUM LASER APPLICATION (Right)  Patient location during evaluation: PACU Anesthesia Type: General Level of consciousness: awake and alert and oriented Pain management: pain level controlled Vital Signs Assessment: post-procedure vital signs reviewed and stable Respiratory status: spontaneous breathing, nonlabored ventilation and respiratory function stable Cardiovascular status: blood pressure returned to baseline and stable Postop Assessment: no apparent nausea or vomiting Anesthetic complications: no   No notable events documented.   Last Vitals:  Vitals:   02/02/21 0930 02/02/21 0946  BP: 135/86 (!) 144/92  Pulse:  68  Resp: 12 16  Temp:  36.4 C  SpO2: 97% 94%    Last Pain:  Vitals:   02/02/21 0946  TempSrc: Oral  PainSc: 4                  Lagena Strand C Kimaya Whitlatch

## 2021-02-03 ENCOUNTER — Other Ambulatory Visit: Payer: Self-pay | Admitting: Urology

## 2021-02-03 DIAGNOSIS — N201 Calculus of ureter: Secondary | ICD-10-CM

## 2021-02-03 MED ORDER — OXYBUTYNIN CHLORIDE 5 MG PO TABS: 5.0000 mg | ORAL_TABLET | Freq: Three times a day (TID) | ORAL | 0 refills | Status: DC | PRN

## 2021-02-03 MED ORDER — TAMSULOSIN HCL 0.4 MG PO CAPS
0.4000 mg | ORAL_CAPSULE | Freq: Every day | ORAL | 0 refills | Status: DC
Start: 2021-02-03 — End: 2021-03-01

## 2021-02-04 ENCOUNTER — Ambulatory Visit: Payer: 59 | Admitting: Nurse Practitioner

## 2021-02-07 NOTE — Telephone Encounter (Signed)
Patient states this medication was only prescribe when she had te kidney stone and she has had the kidney stone removed and does not take this medication. Patient states she did not request refill - pharmacy must have automatically sent  it

## 2021-02-08 ENCOUNTER — Ambulatory Visit (INDEPENDENT_AMBULATORY_CARE_PROVIDER_SITE_OTHER): Payer: 59

## 2021-02-08 ENCOUNTER — Other Ambulatory Visit: Payer: Self-pay

## 2021-02-08 ENCOUNTER — Ambulatory Visit (INDEPENDENT_AMBULATORY_CARE_PROVIDER_SITE_OTHER): Payer: 59 | Admitting: Urology

## 2021-02-08 ENCOUNTER — Encounter: Payer: Self-pay | Admitting: Urology

## 2021-02-08 VITALS — BP 119/78 | HR 90

## 2021-02-08 DIAGNOSIS — N200 Calculus of kidney: Secondary | ICD-10-CM | POA: Diagnosis not present

## 2021-02-08 LAB — CALCULI, WITH PHOTOGRAPH (CLINICAL LAB)
Calcium Oxalate Dihydrate: 80 %
Calcium Oxalate Monohydrate: 20 %
Weight Calculi: 27 mg

## 2021-02-08 MED ORDER — KETOROLAC TROMETHAMINE 30 MG/ML IJ SOLN
60.0000 mg | Freq: Once | INTRAMUSCULAR | Status: AC
Start: 2021-02-08 — End: 2021-02-08
  Administered 2021-02-08: 60 mg via INTRAMUSCULAR

## 2021-02-08 NOTE — Progress Notes (Signed)
Assessment: 1. Nephrolithiasis, right; 6 mm, renal pelvis     Plan: Stent removed today Complete antibiotics Return to office in 1 month Stone prevention discussed and information provided  Chief Complaint:  Chief Complaint  Patient presents with   Nephrolithiasis    History of Present Illness:  [name] is a 50 y.o. year old female who is seen for further evaluation of ureteral calculi and right renal calculus.  She was evaluated with a CT scan on 12/16/2020 for gross hematuria and low back pain.  CT showed a 6 mm nonobstructing calculus in the right renal pelvis.  She had intermittent pain since that time.  She had worsening pain and was seen in the emergency room.  She reported associated nausea and vomiting.  Urinalysis showed >50 RBCs.  Repeat CT scan showed two 1 mm calculi at the right UVJ, mild right-sided hydronephrosis and a 7 mm calculus in the right renal pelvis. She continued to have gross hematuria, frequency, dysuria, and right flank and lower abdominal pain.    She underwent cystoscopy, retrograde pyelograms, right ureteroscopic laser lithotripsy and insertion of a right ureteral stent on 02/02/2021.  No ureteral calculi were visualized.  The right ureteral calculus was completely fragmented with the larger fragments removed with a stone basket.  She has had some stent related symptoms which improved with AZO and oxybutynin.  She continues on her antibiotics.  No fevers, chills, or flank pain. Stone analysis is pending.  Portions of the above documentation were copied from a prior visit for review purposes only.   Past Medical History:  Past Medical History:  Diagnosis Date   Anxiety    Arthritis    Knee   Chronic back pain    Chronic knee pain    Family history of anesthesia complication    Brother and Mother woke up during surgery; Heard and felt could not speak   GERD (gastroesophageal reflux disease)    Headache    stress   HTN (hypertension),  benign 01/22/2019   IBS (irritable bowel syndrome)    Kidney stones    Menorrhagia 09/09/2012    Past Surgical History:  Past Surgical History:  Procedure Laterality Date   BIOPSY  02/01/2016   Procedure: BIOPSY;  Surgeon: West Bali, MD;  [facility];  Service: Endoscopy;;  random colon biopsy   CESAREAN SECTION     X2    CHOLECYSTECTOMY N/A 01/17/2013   Procedure: LAPAROSCOPIC CHOLECYSTECTOMY;  Surgeon: Marlane Hatcher, MD;  Location: [facility];  Service: General;  Laterality: N/A;   COLONOSCOPY WITH PROPOFOL N/A 02/01/2016   Procedure: COLONOSCOPY WITH PROPOFOL;  Surgeon: West Bali, MD;  Location: [facility];  Service: Endoscopy;  Laterality: N/A;  11:45 am   DILITATION & CURRETTAGE/HYSTROSCOPY WITH THERMACHOICE ABLATION N/A 02/25/2013   Procedure: DILATATION & CURETTAGE/HYSTEROSCOPY WITH THERMACHOICE ABLATION;  Surgeon: [name], MD;  Location: [facility];  Service: Gynecology;  Laterality: N/A;  Total Therapy Time:8:54 minutes Temperature:87 degrees   ENDOMETRIAL ABLATION  12/14   ESOPHAGOGASTRODUODENOSCOPY N/A 12/27/2012   Dr. Darrick Penna: stricture at GE junction s/p dilation, non-erosive gastritis, normal duodenum.    TOTAL KNEE ARTHROPLASTY Right 12/15/2014   Procedure: TOTAL KNEE ARTHROPLASTY;  Surgeon: Cammy Copa, MD;  Location: [facility] OR;  Service: Orthopedics;  Laterality: Right;   TUBAL LIGATION     along with c-section    Allergies:  Allergies  Allergen Reactions   Doxycycline Nausea And Vomiting  Patient states " I felt like I was dying".   Penicillins Nausea And Vomiting    Has patient had a PCN reaction causing immediate rash, facial/tongue/throat swelling, SOB or lightheadedness with hypotension: unknown Has patient had a PCN reaction causing severe rash involving mucus membranes or skin necrosis: unknown Has patient had a PCN reaction that required hospitalization: unknown Has patient had a PCN reaction occurring within the last 10  years: yes If all of the above answers are "NO", then may proceed with Cephalosporin use.    Sulfa Antibiotics Swelling    Lip swelling    Xanax [Alprazolam] Other (See Comments)    Increased in anger when taking the Xanax.     Family History:  Family History  Problem Relation Age of Onset   Hypertension Mother    Diabetes Mother    Thyroid disease Mother    COPD Mother    Arthritis Mother    Depression Mother    Mental illness Mother    Stroke Mother    Prostate cancer Father        unsure primary    Colon cancer Father    Diabetes Maternal Grandmother    Diabetes Paternal Grandmother    Cancer Sister    Heart attack Brother    Colon cancer Brother    Diabetes Sister    Depression Sister    Mental illness Sister    Colon polyps Sister     Social History:  Social History   Tobacco Use   Smoking status: Every Day    Packs/day: 0.50    Years: 24.00    Pack years: 12.00    Types: Cigarettes    Start date: 11/26/1983   Smokeless tobacco: Never   Tobacco comments:    quit 3 times for a total of 8 years since 1985  Vaping Use   Vaping Use: Every day  Substance Use Topics   Alcohol use: No    Comment: socially just mixed drinks    Drug use: No    ROS: Constitutional:  Negative for fever, chills, weight loss CV: Negative for chest pain, previous MI, hypertension Respiratory:  Negative for shortness of breath, wheezing, sleep apnea, frequent cough GI:  Negative for nausea, vomiting, bloody stool, GERD  Physical exam: LMP 01/23/2016  GENERAL APPEARANCE:  Well appearing, well developed, well nourished, NAD HEENT:  Atraumatic, normocephalic, oropharynx clear NECK:  Supple without lymphadenopathy or thyromegaly ABDOMEN:  Soft, non-tender, no masses EXTREMITIES:  Moves all extremities well, without clubbing, cyanosis, or edema NEUROLOGIC:  Alert and oriented x 3, normal gait, CN II-XII grossly intact MENTAL STATUS:  appropriate BACK:  Non-tender to palpation, No  CVAT SKIN:  Warm, dry, and intact   Results: U/A:  6-10 WBC, >30 RBC

## 2021-02-08 NOTE — Progress Notes (Signed)
Urological Symptom Review  Patient is experiencing the following symptoms: Get up at night to urinate Blood in urine   Review of Systems  Gastrointestinal (upper)  : Nausea  Gastrointestinal (lower) : Diarrhea  Constitutional : Negative for symptoms  Skin: Negative for skin symptoms  Eyes: Negative for eye symptoms  Ear/Nose/Throat : Negative for Ear/Nose/Throat symptoms  Hematologic/Lymphatic: Negative for Hematologic/Lymphatic symptoms  Cardiovascular : Negative for cardiovascular symptoms  Respiratory : Negative for respiratory symptoms  Endocrine: Negative for endocrine symptoms  Musculoskeletal: Back pain Joint pain  Neurological: Headaches Dizziness  Psychologic: Depression

## 2021-02-08 NOTE — Addendum Note (Signed)
Addended by: Gustavus Messing on: 02/08/2021 02:42 PM   Modules accepted: Orders

## 2021-02-09 LAB — URINALYSIS, ROUTINE W REFLEX MICROSCOPIC
Bilirubin, UA: NEGATIVE
Glucose, UA: NEGATIVE
Nitrite, UA: NEGATIVE
Specific Gravity, UA: 1.025 (ref 1.005–1.030)
Urobilinogen, Ur: 1 mg/dL (ref 0.2–1.0)
pH, UA: 6 (ref 5.0–7.5)

## 2021-02-09 LAB — MICROSCOPIC EXAMINATION
Bacteria, UA: NONE SEEN
Epithelial Cells (non renal): NONE SEEN /hpf (ref 0–10)
RBC, Urine: >30 /hpf — AB (ref 0–2)
Renal Epithel, UA: NONE SEEN /hpf

## 2021-02-10 ENCOUNTER — Encounter (HOSPITAL_COMMUNITY): Payer: Self-pay | Admitting: Urology

## 2021-02-16 ENCOUNTER — Other Ambulatory Visit: Payer: 59

## 2021-02-16 ENCOUNTER — Other Ambulatory Visit: Payer: Self-pay

## 2021-02-16 ENCOUNTER — Telehealth: Payer: Self-pay

## 2021-02-16 DIAGNOSIS — R310 Gross hematuria: Secondary | ICD-10-CM

## 2021-02-16 DIAGNOSIS — R319 Hematuria, unspecified: Secondary | ICD-10-CM

## 2021-02-16 LAB — MICROSCOPIC EXAMINATION
Bacteria, UA: NONE SEEN
Epithelial Cells (non renal): >10 /hpf — AB (ref 0–10)
RBC, Urine: >30 /hpf — AB (ref 0–2)
Renal Epithel, UA: NONE SEEN /hpf

## 2021-02-16 LAB — URINALYSIS, ROUTINE W REFLEX MICROSCOPIC
Bilirubin, UA: NEGATIVE
Glucose, UA: NEGATIVE
Nitrite, UA: NEGATIVE
Specific Gravity, UA: >1.03 — ABNORMAL HIGH (ref 1.005–1.030)
Urobilinogen, Ur: 0.2 mg/dL (ref 0.2–1.0)
pH, UA: 5.5 (ref 5.0–7.5)

## 2021-02-16 MED ORDER — NITROFURANTOIN MONOHYD MACRO 100 MG PO CAPS
100.0000 mg | ORAL_CAPSULE | Freq: Two times a day (BID) | ORAL | 0 refills | Status: DC
Start: 2021-02-16 — End: 2021-03-01

## 2021-02-16 NOTE — Telephone Encounter (Signed)
Patient started on Macrobid per Dr. Ronne Binning. Patient called and made aware

## 2021-02-16 NOTE — Telephone Encounter (Signed)
Patient states she is having intermittent hematuria with pressure and soreness in her abdomen.  Per Dr. Ronne Binning patient can drop off a urine specimen. Patient states she will come this afternoon. Patient placed on lab schedule for ua/culture.

## 2021-02-18 LAB — URINE CULTURE: Organism ID, Bacteria: NO GROWTH

## 2021-02-27 ENCOUNTER — Other Ambulatory Visit: Payer: Self-pay | Admitting: Family Medicine

## 2021-03-01 ENCOUNTER — Encounter: Payer: Self-pay | Admitting: Adult Health

## 2021-03-01 ENCOUNTER — Other Ambulatory Visit: Payer: Self-pay

## 2021-03-01 ENCOUNTER — Ambulatory Visit (INDEPENDENT_AMBULATORY_CARE_PROVIDER_SITE_OTHER): Payer: 59 | Admitting: Adult Health

## 2021-03-01 VITALS — BP 121/82 | HR 82 | Ht 65.0 in | Wt 200.4 lb

## 2021-03-01 DIAGNOSIS — Z6833 Body mass index (BMI) 33.0-33.9, adult: Secondary | ICD-10-CM | POA: Diagnosis not present

## 2021-03-01 DIAGNOSIS — Z713 Dietary counseling and surveillance: Secondary | ICD-10-CM | POA: Diagnosis not present

## 2021-03-01 MED ORDER — PHENTERMINE HCL 37.5 MG PO TABS: 37.5000 mg | ORAL_TABLET | Freq: Every day | ORAL | 0 refills | Status: DC

## 2021-03-01 NOTE — Progress Notes (Signed)
°  Subjective:     Patient ID: [name], female   DOB: May 09, 1970, 51 y.o.   MRN: [id number]  HPI [name] is a 51 year old white female, divorced, PM in for weight and BP check, she started phentermine 02/14/21. Had kidney stones removed, finally feeling better.   Lab Results  Component Value Date   DIAGPAP  10/18/2020    - Negative for intraepithelial lesion or malignancy (NILM)   HPVHIGH Negative 10/18/2020   PCP is Lilyan Punt MD  Review of Systems Sleeping well Reviewed past medical,surgical, social and family history. Reviewed medications and allergies.     Objective:   Physical Exam BP 121/82 (BP Location: Right Arm, Patient Position: Sitting, Cuff Size: Normal)    Pulse 82    Ht 5\' 5"  (1.651 m)    Wt 200 lb 6.4 oz (90.9 kg)    LMP 01/23/2016    BMI 33.35 kg/m     Skin warm and dry.  Lungs: clear to ausculation bilaterally. Cardiovascular: regular rate and rhythm.  She lost over 3 lbs in 2 weeks  Upstream - 03/01/21 0857       Pregnancy Intention Screening   Does the patient want to become pregnant in the next year? N/A    Does the patient's partner want to become pregnant in the next year? N/A    Would the patient like to discuss contraceptive options today? N/A      Contraception Wrap Up   Current Method Female Sterilization    End Method Female Sterilization    Contraception Counseling Provided No             Assessment:     1. Weight loss counseling, encounter for Will continue phentermine Meds ordered this encounter  Medications   phentermine (ADIPEX-P) 37.5 MG tablet    Sig: Take 1 tablet (37.5 mg total) by mouth daily before breakfast. Take 1 daily    Dispense:  30 tablet    Refill:  0    Order Specific Question:   Supervising Provider    Answer:   04/29/21 H [[id number]]    Try to walk more   2. Body mass index 33.0-33.9, adult     Plan:     Follow up in 4 weeks for weight and BP check

## 2021-03-02 ENCOUNTER — Other Ambulatory Visit: Payer: Self-pay | Admitting: Family Medicine

## 2021-03-04 ENCOUNTER — Ambulatory Visit (INDEPENDENT_AMBULATORY_CARE_PROVIDER_SITE_OTHER): Payer: 59 | Admitting: Nurse Practitioner

## 2021-03-04 ENCOUNTER — Encounter: Payer: Self-pay | Admitting: Nurse Practitioner

## 2021-03-04 VITALS — BP 116/81 | HR 82 | Temp 98.8°F | Ht 65.0 in | Wt 201.0 lb

## 2021-03-04 DIAGNOSIS — Z79891 Long term (current) use of opiate analgesic: Secondary | ICD-10-CM

## 2021-03-04 DIAGNOSIS — M1712 Unilateral primary osteoarthritis, left knee: Secondary | ICD-10-CM | POA: Diagnosis not present

## 2021-03-04 DIAGNOSIS — G8929 Other chronic pain: Secondary | ICD-10-CM

## 2021-03-04 DIAGNOSIS — M545 Low back pain, unspecified: Secondary | ICD-10-CM

## 2021-03-04 DIAGNOSIS — M170 Bilateral primary osteoarthritis of knee: Secondary | ICD-10-CM

## 2021-03-04 MED ORDER — HYDROCODONE-ACETAMINOPHEN 10-325 MG PO TABS
ORAL_TABLET | ORAL | 0 refills | Status: DC
Start: 2021-03-04 — End: 2021-06-02

## 2021-03-04 MED ORDER — HYDROCODONE-ACETAMINOPHEN 10-325 MG PO TABS: ORAL_TABLET | ORAL | 0 refills | Status: DC

## 2021-03-04 NOTE — Progress Notes (Signed)
Subjective:    Patient ID: [name], female    DOB: 1970-04-17, 51 y.o.   MRN: [id number]  HPI This patient was seen today for chronic pain  The medication list was reviewed and updated.  Location of Pain for which the patient has been treated with regarding narcotics: low back pain , hips, L knee, joints; has had joint replacement right knee; this still gives her some trouble. Has started medication through GYN for weight loss to help joint pain.  Onset of this pain: chronic   -Compliance with medication: daily  - Number patient states they take daily: 3 to 5 weekly   -when was the last dose patient took? 03/03/21   The patient was advised the importance of maintaining medication and not using illegal substances with these.  Here for refills and follow up  The patient was educated that we can provide 3 monthly scripts for their medication, it is their responsibility to follow the instructions.  Side effects or complications from medications: none  Patient is aware that pain medications are meant to minimize the severity of the pain to allow their pain levels to improve to allow for better function. They are aware of that pain medications cannot totally remove their pain.  Due for UDT ( at least once per year) : 05/2019  Scale of 1 to 10 ( 1 is least 10 is most) Your pain level without the medicine: 8 Your pain level with medication 2  Scale 1 to 10 ( 1-helps very little, 10 helps very well) How well does your pain medication reduce your pain so you can function better through out the day? 8  Quality of the pain: dull , achy   Persistence of the pain: positional, depending on activity  Modifying factors: heating pads, hot shower, stretching, bc powders; heating pad seems to help more than anything  Patient has had problems with hematuria and renal stones.  Had recent surgery and stent placement.  Continues follow-up with urology, her next appointment is next week.   Continue to stay on the same dosing of her current pain medication.  No other pain medication was prescribed during this time.     Objective:   Physical Exam NAD.  Alert, oriented.  Lungs clear.  Heart regular rate rhythm.  Can get on and off exam table with minimal difficulty, this exam table goes very low to the floor.  Gait normal. Today's Vitals   03/04/21 0908  BP: 116/81  Pulse: 82  Temp: 98.8 F (37.1 C)  SpO2: 97%  Weight: 201 lb (91.2 kg)  Height: 5\' 5"  (1.651 m)   Body mass index is 33.45 kg/m.        Assessment & Plan:   Problem List Items Addressed This Visit       Musculoskeletal and Integument   Arthritis of both knees (Chronic)   Relevant Medications   HYDROcodone-acetaminophen (NORCO) 10-325 MG tablet   HYDROcodone-acetaminophen (NORCO) 10-325 MG tablet   HYDROcodone-acetaminophen (NORCO) 10-325 MG tablet   Arthritis of knee, degenerative   Relevant Medications   HYDROcodone-acetaminophen (NORCO) 10-325 MG tablet   HYDROcodone-acetaminophen (NORCO) 10-325 MG tablet   HYDROcodone-acetaminophen (NORCO) 10-325 MG tablet     Other   Chronic back pain   Relevant Medications   HYDROcodone-acetaminophen (NORCO) 10-325 MG tablet   HYDROcodone-acetaminophen (NORCO) 10-325 MG tablet   HYDROcodone-acetaminophen (NORCO) 10-325 MG tablet   Encounter for long-term opiate analgesic use - Primary   Meds ordered this encounter  Medications   HYDROcodone-acetaminophen (NORCO) 10-325 MG tablet    Sig: Take one tablet tid prn pain    Dispense:  75 tablet    Refill:  0    May fill 60 days from 03/04/21    Order Specific Question:   Supervising Provider    Answer:   Sallee Lange A [[id number]]   HYDROcodone-acetaminophen (NORCO) 10-325 MG tablet    Sig: Take one tab po TID prn pain    Dispense:  75 tablet    Refill:  0    May fill 30 days from 03/04/21    Order Specific Question:   Supervising Provider    Answer:   Sallee Lange A [[id number]]   HYDROcodone-acetaminophen  (NORCO) 10-325 MG tablet    Sig: Take one tab po TID prn pain    Dispense:  75 tablet    Refill:  0    Order Specific Question:   Supervising Provider    Answer:   Sallee Lange A [[id number]]   Continue current medication as directed. Continue nonpharmacological therapy such as heating pads and hot showers which seems to help her symptoms. Continue weight loss efforts.  Medication prescribed through gynecology. Return in about 3 months (around 06/02/2021).

## 2021-03-08 ENCOUNTER — Ambulatory Visit: Payer: 59 | Admitting: Urology

## 2021-03-08 NOTE — Progress Notes (Deleted)
Assessment: 1. Nephrolithiasis      Plan: Stone prevention discussed and information provided Schedule for renal U/S at [facility]:  No chief complaint on file.   History of Present Illness:  [name] is a 51 y.o. year old female who is seen for further evaluation of ureteral calculi and right renal calculus.  She was evaluated with a CT scan on 12/16/2020 for gross hematuria and low back pain.  CT showed a 6 mm nonobstructing calculus in the right renal pelvis.  She had intermittent pain since that time.  She had worsening pain and was seen in the emergency room.  She reported associated nausea and vomiting.  Urinalysis showed >50 RBCs.  Repeat CT scan showed two 1 mm calculi at the right UVJ, mild right-sided hydronephrosis and a 7 mm calculus in the right renal pelvis. She continued to have gross hematuria, frequency, dysuria, and right flank and lower abdominal pain.    She underwent cystoscopy, retrograde pyelograms, right ureteroscopic laser lithotripsy and insertion of a right ureteral stent on 02/02/2021.  No ureteral calculi were visualized.  The right ureteral calculus was completely fragmented with the larger fragments removed with a stone basket.  She has had some stent related symptoms which improved with AZO and oxybutynin.  She continues on her antibiotics.  No fevers, chills, or flank pain. Stone analysis: 20% calcium oxalate monohydrate, 80% calcium oxalate dihydrate  Her stent was removed on 02/08/2021.  Portions of the above documentation were copied from a prior visit for review purposes only.   Past Medical History:  Past Medical History:  Diagnosis Date   Anxiety    Arthritis    Knee   Chronic back pain    Chronic knee pain    Family history of anesthesia complication    Brother and Mother woke up during surgery; [place] and felt could not speak   GERD (gastroesophageal reflux disease)    Headache    stress   HTN (hypertension),  benign 01/22/2019   IBS (irritable bowel syndrome)    Kidney stones    Menorrhagia 09/09/2012    Past Surgical History:  Past Surgical History:  Procedure Laterality Date   BIOPSY  02/01/2016   Procedure: BIOPSY;  Surgeon: Danie Binder, MD;  [facility];  Service: Endoscopy;;  random colon biopsy   CESAREAN SECTION     X2    CHOLECYSTECTOMY N/A 01/17/2013   Procedure: LAPAROSCOPIC CHOLECYSTECTOMY;  Surgeon: Scherry Ran, MD;  Location: [facility];  Service: General;  Laterality: N/A;   COLONOSCOPY WITH PROPOFOL N/A 02/01/2016   Procedure: COLONOSCOPY WITH PROPOFOL;  Surgeon: Danie Binder, MD;  [facility];  Service: Endoscopy;  Laterality: N/A;  11:45 am   CYSTOSCOPY WITH RETROGRADE PYELOGRAM, URETEROSCOPY AND STENT PLACEMENT Right 02/02/2021   Procedure: CYSTOSCOPY WITH BILATERAL RETROGRADE PYELOGRAM, URETEROSCOPY AND RIGHT STENT PLACEMENT;  Surgeon: Primus Bravo., MD;  Location: [facility];  Service: Urology;  Laterality: Right;   DILITATION & CURRETTAGE/HYSTROSCOPY WITH THERMACHOICE ABLATION N/A 02/25/2013   Procedure: DILATATION & CURETTAGE/HYSTEROSCOPY WITH THERMACHOICE ABLATION;  Surgeon: Jonnie Kind, MD;  Location: [facility];  Service: Gynecology;  Laterality: N/A;  Total Therapy Time:8:54 minutes Temperature:87 degrees   ENDOMETRIAL ABLATION  12/14   ESOPHAGOGASTRODUODENOSCOPY N/A 12/27/2012   Dr. Oneida Alar: stricture at GE junction s/p dilation, non-erosive gastritis, normal duodenum.    HOLMIUM LASER APPLICATION Right 0000000   Procedure: HOLMIUM LASER APPLICATION;  Surgeon: Michaelle Birks  J., MD;  Location: [facility];  Service: Urology;  Laterality: Right;   TOTAL KNEE ARTHROPLASTY Right 12/15/2014   Procedure: TOTAL KNEE ARTHROPLASTY;  Surgeon: Meredith Pel, MD;  Location: [facility];  Service: Orthopedics;  Laterality: Right;   TUBAL LIGATION     along with c-section    Allergies:  Allergies  Allergen Reactions   Doxycycline Nausea And  Vomiting    Patient states " I felt like I was dying".   Penicillins Nausea And Vomiting    Has patient had a PCN reaction causing immediate rash, facial/tongue/throat swelling, SOB or lightheadedness with hypotension: unknown Has patient had a PCN reaction causing severe rash involving mucus membranes or skin necrosis: unknown Has patient had a PCN reaction that required hospitalization: unknown Has patient had a PCN reaction occurring within the last 10 years: yes If all of the above answers are "NO", then may proceed with Cephalosporin use.    Sulfa Antibiotics Swelling    Lip swelling    Xanax [Alprazolam] Other (See Comments)    Increased in anger when taking the Xanax.     Family History:  Family History  Problem Relation Age of Onset   Hypertension Mother    Diabetes Mother    Thyroid disease Mother    COPD Mother    Arthritis Mother    Depression Mother    Mental illness Mother    Stroke Mother    Prostate cancer Father        unsure primary    Colon cancer Father    Diabetes Maternal Grandmother    Diabetes Paternal Grandmother    Cancer Sister    Heart attack Brother    Colon cancer Brother    Diabetes Sister    Depression Sister    Mental illness Sister    Colon polyps Sister     Social History:  Social History   Tobacco Use   Smoking status: Every Day    Packs/day: 0.50    Years: 24.00    Pack years: 12.00    Types: Cigarettes    Start date: 11/26/1983   Smokeless tobacco: Never   Tobacco comments:    quit 3 times for a total of 8 years since 1985  Vaping Use   Vaping Use: Some days  Substance Use Topics   Alcohol use: No    Comment: socially just mixed drinks    Drug use: No    ROS: Constitutional:  Negative for fever, chills, weight loss CV: Negative for chest pain, previous MI, hypertension Respiratory:  Negative for shortness of breath, wheezing, sleep apnea, frequent cough GI:  Negative for nausea, vomiting, bloody stool,  GERD  Physical exam: LMP 01/23/2016  ***  Results: ***

## 2021-03-14 ENCOUNTER — Ambulatory Visit: Payer: 59 | Admitting: Podiatry

## 2021-03-14 ENCOUNTER — Other Ambulatory Visit: Payer: Self-pay

## 2021-03-14 DIAGNOSIS — B070 Plantar wart: Secondary | ICD-10-CM

## 2021-03-14 MED ORDER — FLUOROURACIL 5 % EX CREA: TOPICAL_CREAM | Freq: Two times a day (BID) | CUTANEOUS | 0 refills | Status: DC

## 2021-03-14 NOTE — Patient Instructions (Signed)
Take dressing off in 8 hours and wash the foot with soap and water. If it is hurting or becomes uncomfortable before the 8 hours, go ahead and remove the bandage and wash the area.  If it blisters, apply antibiotic ointment and a band-aid.  Monitor for any signs/symptoms of infection. Call the office immediately if any occur or go directly to the emergency room. Call with any questions/concerns.   

## 2021-03-15 ENCOUNTER — Encounter: Payer: Self-pay | Admitting: Podiatry

## 2021-03-16 ENCOUNTER — Encounter: Payer: Self-pay | Admitting: Podiatry

## 2021-03-16 NOTE — Progress Notes (Signed)
°  Subjective:  Patient ID: [name], female    DOB: [birth year],  MRN: [id number]  Chief Complaint  Patient presents with   Callouses     NP - has spots on toes from steel toe shoes , bilateral feet    51 y.o. female presents with the above complaint. History confirmed with patient.  Places her under the ball of the foot.  She wears steel toed shoes at work.  Her PCP thought that may be warts.  Objective:  Physical Exam: warm, good capillary refill, no trophic changes or ulcerative lesions, normal DP and PT pulses, and normal sensory exam. Left Foot: Cluster of multiple verruca present on the plantar forefoot, painful with lateral compression small petechial hemorrhage in the central portions Assessment:   1. Verruca plantaris      Plan:  Patient was evaluated and treated and all questions answered.  Discussed etiology and treatment of verruca plantaris in detail with the patient as well as multiple treatment options including blistering agents, chemotherapeutic agents, surgical excision, laser therapy and the indications and roles of the above.  Today, recommended treatment with Cantharone as noted in procedure note below.  Follow-up in 3 weeks for reevaluation.  Efudex Rx sent to pharmacy for daily use as well  Procedure: Destruction of Lesion Location: Left forefoot Instrumentation: 15 blade. Technique: Debridement of lesion to petechial bleeding. Aperture pad applied around lesion. Small amount of canthrone applied to the base of the lesion. Dressing: Dry, sterile, compression dressing. Disposition: Patient tolerated procedure well. Advised to leave dressing on for 6-8 hours. Thereafter patient to wash the area with soap and water and applied band-aid. Off-loading pads dispensed. Patient to return in 3 weeks for follow-up.   Return in about 3 weeks (around 04/04/2021) for to check on wart.

## 2021-03-17 ENCOUNTER — Encounter: Payer: Self-pay | Admitting: Podiatry

## 2021-03-21 ENCOUNTER — Ambulatory Visit: Payer: 59 | Admitting: Urology

## 2021-03-21 DIAGNOSIS — N201 Calculus of ureter: Secondary | ICD-10-CM

## 2021-03-21 NOTE — Progress Notes (Deleted)
Assessment: 1. Ureteral calculus, right   2. Nephrolithiasis      Plan: Stone prevention discussed and information provided Schedule for renal U/S at [facility]:  No chief complaint on file.   History of Present Illness:  [name] is a 51 y.o. year old female who is seen for further evaluation of ureteral calculi and right renal calculus.  She was evaluated with a CT scan on 12/16/2020 for gross hematuria and low back pain.  CT showed a 6 mm nonobstructing calculus in the right renal pelvis.  She had intermittent pain.  She had worsening pain with associated nausea and vomiting and was evaluated in the emergency room.  Urinalysis showed >50 RBCs.  Repeat CT scan showed two 1 mm calculi at the right UVJ, mild right-sided hydronephrosis and a 7 mm calculus in the right renal pelvis. She continued to have gross hematuria, frequency, dysuria, and right flank and lower abdominal pain.    She underwent cystoscopy, retrograde pyelograms, right ureteroscopic laser lithotripsy and insertion of a right ureteral stent on 02/02/2021.  No ureteral calculi were visualized.  The right ureteral calculus was completely fragmented with the larger fragments removed with a stone basket.  She has had some stent related symptoms which improved with AZO and oxybutynin.  She continues on her antibiotics.  No fevers, chills, or flank pain. Stone analysis: 20% calcium oxalate monohydrate, 80% calcium oxalate dihydrate  Her stent was removed on 02/08/2021.  Portions of the above documentation were copied from a prior visit for review purposes only.   Past Medical History:  Past Medical History:  Diagnosis Date   Anxiety    Arthritis    Knee   Chronic back pain    Chronic knee pain    Family history of anesthesia complication    Brother and Mother woke up during surgery; [place] and felt could not speak   GERD (gastroesophageal reflux disease)    Headache    stress   HTN  (hypertension), benign 01/22/2019   IBS (irritable bowel syndrome)    Kidney stones    Menorrhagia 09/09/2012    Past Surgical History:  Past Surgical History:  Procedure Laterality Date   BIOPSY  02/01/2016   Procedure: BIOPSY;  Surgeon: Danie Binder, MD;  [facility];  Service: Endoscopy;;  random colon biopsy   CESAREAN SECTION     X2    CHOLECYSTECTOMY N/A 01/17/2013   Procedure: LAPAROSCOPIC CHOLECYSTECTOMY;  Surgeon: Scherry Ran, MD;  [facility];  Service: General;  Laterality: N/A;   COLONOSCOPY WITH PROPOFOL N/A 02/01/2016   Procedure: COLONOSCOPY WITH PROPOFOL;  Surgeon: Danie Binder, MD;  [facility];  Service: Endoscopy;  Laterality: N/A;  11:45 am   CYSTOSCOPY WITH RETROGRADE PYELOGRAM, URETEROSCOPY AND STENT PLACEMENT Right 02/02/2021   Procedure: CYSTOSCOPY WITH BILATERAL RETROGRADE PYELOGRAM, URETEROSCOPY AND RIGHT STENT PLACEMENT;  Surgeon: Primus Bravo., MD;  [facility];  Service: Urology;  Laterality: Right;   DILITATION & CURRETTAGE/HYSTROSCOPY WITH THERMACHOICE ABLATION N/A 02/25/2013   Procedure: DILATATION & CURETTAGE/HYSTEROSCOPY WITH THERMACHOICE ABLATION;  Surgeon: Jonnie Kind, MD;  [facility];  Service: Gynecology;  Laterality: N/A;  Total Therapy Time:8:54 minutes Temperature:87 degrees   ENDOMETRIAL ABLATION  12/14   ESOPHAGOGASTRODUODENOSCOPY N/A 12/27/2012   Dr. Oneida Alar: stricture at GE junction s/p dilation, non-erosive gastritis, normal duodenum.    HOLMIUM LASER APPLICATION Right 0000000   Procedure: HOLMIUM LASER APPLICATION;  Surgeon: Felipa Eth,  Reece Leader., MD;  Location[facility];  Service: Urology;  Laterality: Right;   TOTAL KNEE ARTHROPLASTY Right 12/15/2014   Procedure: TOTAL KNEE ARTHROPLASTY;  Surgeon: Meredith Pel, MD;  Location: [facility];  Service: Orthopedics;  Laterality: Right;   TUBAL LIGATION     along with c-section    Allergies:  Allergies  Allergen Reactions    Doxycycline Nausea And Vomiting    Patient states " I felt like I was dying".   Penicillins Nausea And Vomiting    Has patient had a PCN reaction causing immediate rash, facial/tongue/throat swelling, SOB or lightheadedness with hypotension: unknown Has patient had a PCN reaction causing severe rash involving mucus membranes or skin necrosis: unknown Has patient had a PCN reaction that required hospitalization: unknown Has patient had a PCN reaction occurring within the last 10 years: yes If all of the above answers are "NO", then may proceed with Cephalosporin use.    Sulfa Antibiotics Swelling    Lip swelling    Xanax [Alprazolam] Other (See Comments)    Increased in anger when taking the Xanax.     Family History:  Family History  Problem Relation Age of Onset   Hypertension Mother    Diabetes Mother    Thyroid disease Mother    COPD Mother    Arthritis Mother    Depression Mother    Mental illness Mother    Stroke Mother    Prostate cancer Father        unsure primary    Colon cancer Father    Diabetes Maternal Grandmother    Diabetes Paternal Grandmother    Cancer Sister    Heart attack Brother    Colon cancer Brother    Diabetes Sister    Depression Sister    Mental illness Sister    Colon polyps Sister     Social History:  Social History   Tobacco Use   Smoking status: Every Day    Packs/day: 0.50    Years: 24.00    Pack years: 12.00    Types: Cigarettes    Start date: 11/26/1983   Smokeless tobacco: Never   Tobacco comments:    quit 3 times for a total of 8 years since 1985  Vaping Use   Vaping Use: Some days  Substance Use Topics   Alcohol use: No    Comment: socially just mixed drinks    Drug use: No    ROS: Constitutional:  Negative for fever, chills, weight loss CV: Negative for chest pain, previous MI, hypertension Respiratory:  Negative for shortness of breath, wheezing, sleep apnea, frequent cough GI:  Negative for nausea, vomiting,  bloody stool, GERD  Physical exam: LMP 01/23/2016  ***  Results: ***

## 2021-03-26 ENCOUNTER — Other Ambulatory Visit: Payer: Self-pay | Admitting: Family Medicine

## 2021-03-26 DIAGNOSIS — I10 Essential (primary) hypertension: Secondary | ICD-10-CM

## 2021-03-29 ENCOUNTER — Ambulatory Visit: Payer: 59 | Admitting: Adult Health

## 2021-04-04 ENCOUNTER — Other Ambulatory Visit: Payer: Self-pay

## 2021-04-04 ENCOUNTER — Ambulatory Visit (INDEPENDENT_AMBULATORY_CARE_PROVIDER_SITE_OTHER): Payer: 59 | Admitting: Family Medicine

## 2021-04-04 ENCOUNTER — Encounter: Payer: Self-pay | Admitting: Family Medicine

## 2021-04-04 VITALS — BP 130/91 | HR 79 | Temp 98.3°F | Wt 203.4 lb

## 2021-04-04 DIAGNOSIS — J019 Acute sinusitis, unspecified: Secondary | ICD-10-CM | POA: Insufficient documentation

## 2021-04-04 MED ORDER — CEFDINIR 300 MG PO CAPS: 300.0000 mg | ORAL_CAPSULE | Freq: Two times a day (BID) | ORAL | 0 refills | Status: DC

## 2021-04-04 NOTE — Progress Notes (Signed)
Subjective:  Patient ID: [name], female    DOB: August 04, 1970  Age: 51 y.o. MRN: [id number]  CC: Chief Complaint  Patient presents with   Cough    Started 2 days ago. Productive yellowish mucus. (She is a smoker)   Nasal Congestion    Going on for a week. Blowing out, clear, white and thick   Sore Throat   sneezing    HPI:  51 year old female presents with respiratory symptoms.  Patient has been sick for over a week.  She reports she initially had congestion and sore throat.  Sore throat has resolved.  However, she continues to have nasal congestion and discolored nasal discharge.  Also has a harsh productive cough.  No fever.  She has taken over-the-counter medication without resolution.  No other reported symptoms.  No other complaints.  Patient Active Problem List   Diagnosis Date Noted   Acute sinusitis 04/04/2021   Hormone replacement therapy (HRT) 01/17/2021   Body mass index 33.0-33.9, adult 01/17/2021   Weight loss counseling, encounter for 01/17/2021   Ureteral calculus, right; 2 1 mm calculi, UVJ 01/11/2021   Nephrolithiasis 01/07/2021   Dense breasts 12/03/2020   Menopause 10/18/2020   Encounter for gynecological examination with Papanicolaou smear of cervix 10/18/2020   Cigarette smoker 12/14/2019   Chronic migraine without aura without status migrainosus, not intractable 12/12/2019   Sleep disturbance 09/15/2019   HTN (hypertension), benign 01/22/2019   Depression, major, single episode, moderate ([place]) 03/21/2018   Irritable bowel syndrome with diarrhea 06/19/2015   Arthritis of knee, degenerative 12/15/2014   Anxiety as acute reaction to exceptional stress 11/28/2014   Chondromalacia of right knee 11/28/2014   Stressful life event affecting family 11/06/2014   Encounter for long-term opiate analgesic use 09/15/2014   Chronic back pain 06/16/2014   DUB (dysfunctional uterine bleeding) 02/17/2013   Family history of colon cancer 12/19/2012   Dyspepsia  12/17/2012   GERD (gastroesophageal reflux disease) 11/03/2012   Analgesic overuse headache 11/03/2012    Social Hx   Social History   Socioeconomic History   Marital status: Divorced    Spouse name: Not on file   Number of children: Not on file   Years of education: Not on file   Highest education level: Not on file  Occupational History   Not on file  Tobacco Use   Smoking status: Every Day    Packs/day: 0.50    Years: 24.00    Pack years: 12.00    Types: Cigarettes    Start date: 11/26/1983   Smokeless tobacco: Never   Tobacco comments:    quit 3 times for a total of 8 years since 1985  Vaping Use   Vaping Use: Some days  Substance and Sexual Activity   Alcohol use: No    Comment: socially just mixed drinks    Drug use: No   Sexual activity: Yes    Partners: Male    Birth control/protection: Surgical, Post-menopausal    Comment: tubal  Other Topics Concern   Not on file  Social History Narrative   Not on file   Social Determinants of Health   Financial Resource Strain: Low Risk    Difficulty of Paying Living Expenses: Not very hard  Food Insecurity: No Food Insecurity   Worried About Running Out of Food in the Last Year: Never true   Ran Out of Food in the Last Year: Never true  Transportation Needs: No Transportation Needs   Lack of Transportation (  Medical): No   Lack of Transportation (Non-Medical): No  Physical Activity: Inactive   Days of Exercise per Week: 0 days   Minutes of Exercise per Session: 0 min  Stress: Stress Concern Present   Feeling of Stress : Rather much  Social Connections: Moderately Isolated   Frequency of Communication with Friends and Family: More than three times a week   Frequency of Social Gatherings with Friends and Family: More than three times a week   Attends Religious Services: Never   Marine scientist or Organizations: No   Attends Music therapist: Never   Marital Status: Living with partner     Review of Systems Per HPI  Objective:  BP (!) 130/91    Pulse 79    Temp 98.3 F (36.8 C) (Oral)    Wt 203 lb 6.4 oz (92.3 kg)    LMP 01/23/2016    SpO2 98%    BMI 33.85 kg/m   BP/Weight 04/04/2021 [id number]  Systolic BP [id number]  Diastolic BP 91 81 82  Wt. (Lbs) 203.4 201 200.4  BMI 33.85 33.45 33.35    Physical Exam Vitals and nursing note reviewed.  Constitutional:      General: She is not in acute distress.    Appearance: She is well-developed. She is not ill-appearing.  HENT:     Head: Normocephalic and atraumatic.     Nose: Congestion present.     Mouth/Throat:     Pharynx: Posterior oropharyngeal erythema present.  Cardiovascular:     Rate and Rhythm: Normal rate and regular rhythm.     Heart sounds: No murmur heard. Pulmonary:     Effort: Pulmonary effort is normal.     Breath sounds: Normal breath sounds. No wheezing, rhonchi or rales.  Neurological:     Mental Status: She is alert.    Lab Results  Component Value Date   WBC 13.7 (H) 01/10/2021   HGB 15.9 (H) 01/10/2021   HCT 47.6 (H) 01/10/2021   PLT 208 01/10/2021   GLUCOSE 89 01/31/2021   CHOL 148 09/01/2020   TRIG 134 09/01/2020   HDL 48 09/01/2020   LDLCALC 76 09/01/2020   ALT 20 01/10/2021   AST 19 01/10/2021   NA 139 01/31/2021   K 3.7 01/31/2021   CL 109 01/31/2021   CREATININE 0.73 01/31/2021   BUN 21 (H) 01/31/2021   CO2 24 01/31/2021   TSH 1.450 09/01/2020     Assessment & Plan:   Problem List Items Addressed This Visit       Respiratory   Acute sinusitis - Primary    Treating with Omnicef given penicillin allergy as well as allergy to doxycycline.      Relevant Medications   cefdinir (OMNICEF) 300 MG capsule    Meds ordered this encounter  Medications   cefdinir (OMNICEF) 300 MG capsule    Sig: Take 1 capsule (300 mg total) by mouth 2 (two) times daily.    Dispense:  14 capsule    Refill:  [facility]

## 2021-04-04 NOTE — Patient Instructions (Signed)
Antibiotic as prescribed.  Call with concerns.  Take care  Dr. Latroy Gaymon  

## 2021-04-04 NOTE — Assessment & Plan Note (Signed)
Treating with Omnicef given penicillin allergy as well as allergy to doxycycline.

## 2021-04-09 ENCOUNTER — Encounter: Payer: Self-pay | Admitting: Family Medicine

## 2021-04-11 ENCOUNTER — Ambulatory Visit: Payer: 59 | Admitting: Podiatry

## 2021-04-11 ENCOUNTER — Other Ambulatory Visit: Payer: Self-pay

## 2021-04-11 DIAGNOSIS — B070 Plantar wart: Secondary | ICD-10-CM | POA: Diagnosis not present

## 2021-04-11 MED ORDER — LIDOCAINE 5 % EX OINT: 1.0000 "application " | TOPICAL_OINTMENT | CUTANEOUS | 0 refills | Status: DC | PRN

## 2021-04-11 MED ORDER — FLUCONAZOLE 150 MG PO TABS
150.0000 mg | ORAL_TABLET | Freq: Once | ORAL | 4 refills | Status: AC
Start: 2021-04-11 — End: 2021-04-11

## 2021-04-11 NOTE — Telephone Encounter (Signed)
Nurses-please send in Diflucan 150 mg 1 p.o. x1 with 4 refills

## 2021-04-11 NOTE — Telephone Encounter (Signed)
Prescription sent electronically to pharmacy. 

## 2021-04-12 ENCOUNTER — Encounter: Payer: Self-pay | Admitting: Podiatry

## 2021-04-16 NOTE — Progress Notes (Signed)
°  Subjective:  Patient ID: [name], female    DOB: January 14, 1971,  MRN: [id number]  Chief Complaint  Patient presents with   Plantar Warts    3 week follow up    51 y.o. female presents with the above complaint. History confirmed with patient.  Doing well.  Seems to be improving.  Objective:  Physical Exam: warm, good capillary refill, no trophic changes or ulcerative lesions, normal DP and PT pulses, and normal sensory exam. Left Foot: Cluster of multiple verruca present on the plantar forefoot, painful with lateral compression small petechial hemorrhage in the central portions Right foot: Single solitary lesion right submetatarsal 3 Assessment:   1. Verruca plantaris       Plan:  Patient was evaluated and treated and all questions answered.  Discussed etiology and treatment of verruca plantaris in detail with the patient as well as multiple treatment options including blistering agents, chemotherapeutic agents, surgical excision, laser therapy and the indications and roles of the above.  Today, recommended treatment with Cantharone as noted in procedure note below.  Follow-up in 3 weeks for reevaluation.   Procedure: Destruction of Lesion Location: Left forefoot, right forefoot Instrumentation: 15 blade. Technique: Applied lidocaine cream for local anesthesia.  Debridement of lesion to petechial bleeding. Aperture pad applied around lesion. Small amount of canthrone applied to the base of the lesion. Dressing: Dry, sterile, compression dressing. Disposition: Patient tolerated procedure well. Advised to leave dressing on for 6-8 hours. Thereafter patient to wash the area with soap and water and applied band-aid. Off-loading pads dispensed. Patient to return in 3 weeks for follow-up.   Return in 3 weeks (on 05/02/2021) for to check on wart - apply lidocaine cream when roomed.

## 2021-04-25 ENCOUNTER — Other Ambulatory Visit: Payer: Self-pay | Admitting: Family Medicine

## 2021-04-25 DIAGNOSIS — I10 Essential (primary) hypertension: Secondary | ICD-10-CM

## 2021-04-27 ENCOUNTER — Other Ambulatory Visit: Payer: Self-pay | Admitting: Family Medicine

## 2021-04-27 DIAGNOSIS — F411 Generalized anxiety disorder: Secondary | ICD-10-CM

## 2021-05-03 ENCOUNTER — Ambulatory Visit: Payer: 59 | Admitting: Podiatry

## 2021-05-03 ENCOUNTER — Other Ambulatory Visit: Payer: Self-pay

## 2021-05-03 DIAGNOSIS — B070 Plantar wart: Secondary | ICD-10-CM | POA: Diagnosis not present

## 2021-05-04 NOTE — Progress Notes (Signed)
?  Subjective:  ?Patient ID: [name], female    DOB: Oct 15, 1970,  MRN: [id number] ? ?Chief Complaint  ?Patient presents with  ? Plantar Warts  ?   3wk for to check on wart -  ? ? ?51 y.o. female presents with the above complaint. History confirmed with patient.  Had a severe blister that formed but is doing much better now.  Pain is reduced compared to pretreatment level ? ?Objective:  ?Physical Exam: ?warm, good capillary refill, no trophic changes or ulcerative lesions, normal DP and PT pulses, and normal sensory exam. ?Left Foot: Now 2 small lesions present, significantly reduced in size and symptoms ?Right foot: Single solitary lesion right submetatarsal 3, greatly reduced in size ?Assessment:  ? ?1. Verruca plantaris   ? ? ? ? ?Plan:  ?Patient was evaluated and treated and all questions answered. ? ?Discussed etiology and treatment of verruca plantaris in detail with the patient as well as multiple treatment options including blistering agents, chemotherapeutic agents, surgical excision, laser therapy and the indications and roles of the above.  Today, recommended treatment with Cantharone as noted in procedure note below.  Follow-up in 1 month for reevaluation.  ? ?Procedure: Destruction of Lesion ?Location: Left forefoot, right forefoot ?Instrumentation: 15 blade. ?Technique: Applied lidocaine cream for local anesthesia.  Debridement of lesion to petechial bleeding. Aperture pad applied around lesion. Small amount of canthrone applied to the base of the lesion. ?Dressing: Dry, sterile, compression dressing. ?Disposition: Patient tolerated procedure well. Advised to leave dressing on for 6-8 hours. Thereafter patient to wash the area with soap and water and applied band-aid. Off-loading pads dispensed. Patient to return in 1 month for follow-up. ? ? ?Return in about 1 month (around 06/03/2021) for wart treatment .  ? ?

## 2021-05-13 ENCOUNTER — Other Ambulatory Visit: Payer: Self-pay | Admitting: Adult Health

## 2021-05-19 ENCOUNTER — Other Ambulatory Visit: Payer: Self-pay | Admitting: Family Medicine

## 2021-05-19 DIAGNOSIS — I10 Essential (primary) hypertension: Secondary | ICD-10-CM

## 2021-05-29 ENCOUNTER — Other Ambulatory Visit: Payer: Self-pay | Admitting: Family Medicine

## 2021-06-02 ENCOUNTER — Ambulatory Visit (INDEPENDENT_AMBULATORY_CARE_PROVIDER_SITE_OTHER): Payer: 59 | Admitting: Family Medicine

## 2021-06-02 ENCOUNTER — Encounter: Payer: Self-pay | Admitting: Family Medicine

## 2021-06-02 VITALS — BP 137/88 | Ht 65.0 in | Wt 203.2 lb

## 2021-06-02 DIAGNOSIS — I10 Essential (primary) hypertension: Secondary | ICD-10-CM | POA: Diagnosis not present

## 2021-06-02 DIAGNOSIS — Z79891 Long term (current) use of opiate analgesic: Secondary | ICD-10-CM | POA: Diagnosis not present

## 2021-06-02 DIAGNOSIS — F411 Generalized anxiety disorder: Secondary | ICD-10-CM

## 2021-06-02 DIAGNOSIS — R32 Unspecified urinary incontinence: Secondary | ICD-10-CM | POA: Diagnosis not present

## 2021-06-02 DIAGNOSIS — K219 Gastro-esophageal reflux disease without esophagitis: Secondary | ICD-10-CM

## 2021-06-02 MED ORDER — HYDROCODONE-ACETAMINOPHEN 10-325 MG PO TABS: ORAL_TABLET | ORAL | 0 refills | Status: DC

## 2021-06-02 MED ORDER — CITALOPRAM HYDROBROMIDE 20 MG PO TABS: 20.0000 mg | ORAL_TABLET | Freq: Every day | ORAL | 1 refills | Status: DC

## 2021-06-02 MED ORDER — PROPRANOLOL HCL 40 MG PO TABS: 40.0000 mg | ORAL_TABLET | Freq: Two times a day (BID) | ORAL | 1 refills | Status: DC

## 2021-06-02 NOTE — Progress Notes (Signed)
Referral ordered in EPIC. 

## 2021-06-02 NOTE — Patient Instructions (Signed)

## 2021-06-02 NOTE — Progress Notes (Signed)
? ?Subjective:  ? ? Patient ID: [name], female    DOB: 26-Oct-1970, 51 y.o.   MRN: [id number] ? ?HPI ? ?This patient was seen today for chronic pain ? ?The medication list was reviewed and updated. ? ?Location of Pain for which the patient has been treated with regarding narcotics: back, hip and knees ? ?Onset of this pain: years ? ? -Compliance with medication: years ? ?- Number patient states they take daily: 1-2 a day only if in pain- somedays take none ? ?-when was the last dose patient took? yesterday ? ?The patient was advised the importance of maintaining medication and not using illegal substances with these. ? ?Here for refills and follow up ? ?The patient was educated that we can provide 3 monthly scripts for their medication, it is their responsibility to follow the instructions. ? ?Side effects or complications from medications: none ? ?Patient is aware that pain medications are meant to minimize the severity of the pain to allow their pain levels to improve to allow for better function. They are aware of that pain medications cannot totally remove their pain. ? ?Due for UDT ( at least once per year) : due today ? ?Scale of 1 to 10 ( 1 is least 10 is most) ?Your pain level without the medicine: 8-9 ?Your pain level with medication 2-3 ? ?Scale 1 to 10 ( 1-helps very little, 10 helps very well) ?How well does your pain medication reduce your pain so you can function better through out the day? 10 ? ?Encounter for long-term opiate analgesic use - Plan: ToxASSURE Select 13 (MW), Urine ? ?HTN (hypertension), benign ? ?GAD (generalized anxiety disorder) - Plan: citalopram (CELEXA) 20 MG tablet ? ?Urinary incontinence, unspecified type - Plan: Ambulatory referral to Urogynecology ? ?Gastroesophageal reflux disease without esophagitis ? ?She has been having intermittent incontinence issues during the day she does not understand why this is happening.  Has not had it before.  Denies any hematuria. ? ?Mild  anxiety related issues but under good control with medications and self-care. ? ?Does have underlying blood pressure issues takes her medicine tries watch her diet we did discuss healthy diet ?   ? ?Review of Systems ? ?   ?Objective:  ? Physical Exam ?General-in no acute distress ?Eyes-no discharge ?Lungs-respiratory rate normal, CTA ?CV-no murmurs,RRR ?Extremities skin warm dry no edema ?Neuro grossly normal ?Behavior normal, alert ? ? ? ? ?   ?Assessment & Plan:  ?1. Encounter for long-term opiate analgesic use ?The patient was seen in followup for chronic pain. ?A review over at their current pain status was discussed. Drug registry was checked. ?Prescriptions were given.  Regular follow-up recommended. ?Discussion was held regarding the importance of compliance with medication as well as pain medication contract. ? ?Patient was informed that medication may cause drowsiness and should not be combined  with other medications/alcohol or street drugs. If the patient feels medication is causing altered alertness then do not drive or operate dangerous equipment. ? ?Should be noted that the patient appears to be meeting appropriate use of opioids and response.  Evidenced by improved function and decent pain control without significant side effects and no evidence of overt aberrancy issues.  Upon discussion with the patient today they understand that opioid therapy is optional and they feel that the pain has been refractory to reasonable conservative measures and is significant and affecting quality of life enough to warrant ongoing therapy and wishes to continue opioids.  Refills were  provided. ? ?Patient does relate that the pain medicine allows her to function better drug registry checks out urine drug screen today she keeps her medication in a safe spot denies any abuse with her drowsiness ?- ToxASSURE Select 13 (MW), Urine ?When it comes to her pain medicine she does agree that limiting it to 75/month is the best  approach there will be some months she pulls in less than this. ?Patient to do follow-up within 3 months ? ?2. HTN (hypertension), benign ?Blood pressure under good control trying to watch her diet ? ?3. GAD (generalized anxiety disorder) ?Has a lot of stressors on her but denies any severe depression or other issues would like to continue medication ?- citalopram (CELEXA) 20 MG tablet; Take 1 tablet (20 mg total) by mouth daily.  Dispense: 90 tablet; Refill: 1 ? ?4. Urinary incontinence, unspecified type ?Has frequent incontinence issues is requesting specialist evaluation ? ?5. Gastroesophageal reflux disease without esophagitis ?Under fair control dietary measures discussed continue Protonix May supplement with Pepcid no acid reflux issues currently just regurgitation ? ?

## 2021-06-03 LAB — MED LIST OPTION NOT SELECTED

## 2021-06-07 ENCOUNTER — Ambulatory Visit: Payer: 59 | Admitting: Podiatry

## 2021-06-07 ENCOUNTER — Telehealth: Payer: Self-pay | Admitting: Podiatry

## 2021-06-07 NOTE — Telephone Encounter (Signed)
Pt left messag @ 1256pm today stating she needed to cancel her appt for 145pm today something has come up. She will call to reschedule. ? ?

## 2021-06-07 NOTE — Telephone Encounter (Signed)
Sounds good thanks for letting us know

## 2021-06-09 LAB — SPECIMEN STATUS REPORT

## 2021-06-09 LAB — TOXASSURE SELECT 13 (MW), URINE

## 2021-06-14 ENCOUNTER — Ambulatory Visit: Payer: 59 | Admitting: Family Medicine

## 2021-06-20 ENCOUNTER — Other Ambulatory Visit: Payer: Self-pay | Admitting: Family Medicine

## 2021-06-20 DIAGNOSIS — I10 Essential (primary) hypertension: Secondary | ICD-10-CM

## 2021-07-06 ENCOUNTER — Other Ambulatory Visit: Payer: Self-pay | Admitting: Family Medicine

## 2021-07-21 ENCOUNTER — Other Ambulatory Visit: Payer: Self-pay | Admitting: Family Medicine

## 2021-07-21 DIAGNOSIS — I10 Essential (primary) hypertension: Secondary | ICD-10-CM

## 2021-08-24 ENCOUNTER — Other Ambulatory Visit: Payer: Self-pay | Admitting: Family Medicine

## 2021-08-24 DIAGNOSIS — I10 Essential (primary) hypertension: Secondary | ICD-10-CM

## 2021-08-26 ENCOUNTER — Ambulatory Visit: Payer: 59 | Admitting: Nurse Practitioner

## 2021-08-26 ENCOUNTER — Ambulatory Visit: Payer: 59 | Admitting: Family Medicine

## 2021-09-02 ENCOUNTER — Ambulatory Visit (INDEPENDENT_AMBULATORY_CARE_PROVIDER_SITE_OTHER): Payer: 59 | Admitting: Nurse Practitioner

## 2021-09-02 ENCOUNTER — Encounter: Payer: Self-pay | Admitting: Nurse Practitioner

## 2021-09-02 VITALS — BP 137/91 | HR 90 | Temp 98.1°F | Ht 65.0 in | Wt 201.0 lb

## 2021-09-02 DIAGNOSIS — M5442 Lumbago with sciatica, left side: Secondary | ICD-10-CM | POA: Diagnosis not present

## 2021-09-02 DIAGNOSIS — G8929 Other chronic pain: Secondary | ICD-10-CM

## 2021-09-02 DIAGNOSIS — Z79891 Long term (current) use of opiate analgesic: Secondary | ICD-10-CM | POA: Diagnosis not present

## 2021-09-02 DIAGNOSIS — M1712 Unilateral primary osteoarthritis, left knee: Secondary | ICD-10-CM

## 2021-09-02 DIAGNOSIS — Z6833 Body mass index (BMI) 33.0-33.9, adult: Secondary | ICD-10-CM

## 2021-09-02 MED ORDER — HYDROCODONE-ACETAMINOPHEN 10-325 MG PO TABS: ORAL_TABLET | ORAL | 0 refills | Status: DC

## 2021-09-02 NOTE — Progress Notes (Signed)
Subjective:    Patient ID: [name], female    DOB: [birth year], 51 y.o.   MRN: [id number]  HPI This patient was seen today for chronic pain  The medication list was reviewed and updated.  Location of Pain for which the patient has been treated with regarding narcotics: low back, joints, sciatic nerve, right knee  Onset of this pain: chronic   -Compliance with medication: qd prn  - Number patient states they take daily: 1 tp 2 q day in halfs  -when was the last dose patient took? Yesterday around midday  The patient was advised the importance of maintaining medication and not using illegal substances with these.  Here for refills and follow up  The patient was educated that we can provide 3 monthly scripts for their medication, it is their responsibility to follow the instructions.  Side effects or complications from medications: no  Patient is aware that pain medications are meant to minimize the severity of the pain to allow their pain levels to improve to allow for better function. They are aware of that pain medications cannot totally remove their pain.  Due for UDT ( at least once per year) : 05/2021  Scale of 1 to 10 ( 1 is least 10 is most) Your pain level without the medicine: 7 to 8  Your pain level with medication 1 to 2   Scale 1 to 10 ( 1-helps very little, 10 helps very well) How well does your pain medication reduce your pain so you can function better through out the day? 2  Quality of the pain: dull, achy,   Persistence of the pain: constant when present  Modifying factors: heating pad, BC powder  Right knee with replacement has mild pain. Most of the pain involved left knee. Having left low back pain radiating down the back of the leg to the knee. This is a chronic issue that flares up depending on activity.  Has been struggling with losing weight which she thinks will help her back and knee pain. Smokes about 1/2 pack/day, this has decreased some  especially on the days that she keeps her granddaughter.  States this helps her with her anxiety and stress. Has noticed some limitation in certain activities that she enjoys such as riding a motorcycle with her husband, states they had to stop 6 times during a 2-hour ride recently which is very unusual for her.  States her back was "killing her".         Objective:   Physical Exam NAD.  Alert, oriented.  Cheerful mildly anxious affect.  Lungs clear.  Heart regular rate rhythm.  Diffuse tenderness noted along the bilateral lumbar paraspinal area more so on the left side.  SLR slightly positive on the left negative on the right.  Normal reflexes lower extremities.  Gait slow but steady. Today's Vitals   09/02/21 0858  BP: (!) 137/91  Pulse: 90  Temp: 98.1 F (36.7 C)  SpO2: 98%  Weight: 201 lb (91.2 kg)  Height: 5\' 5"  (1.651 m)   Body mass index is 33.45 kg/m.       Assessment & Plan:   Problem List Items Addressed This Visit       Nervous and Auditory   Chronic left-sided low back pain with left-sided sciatica   Relevant Medications   HYDROcodone-acetaminophen (NORCO) 10-325 MG tablet   HYDROcodone-acetaminophen (NORCO) 10-325 MG tablet   HYDROcodone-acetaminophen (NORCO) 10-325 MG tablet     Musculoskeletal and Integument  Arthritis of knee, degenerative   Relevant Medications   HYDROcodone-acetaminophen (NORCO) 10-325 MG tablet   HYDROcodone-acetaminophen (NORCO) 10-325 MG tablet   HYDROcodone-acetaminophen (NORCO) 10-325 MG tablet     Other   Body mass index 33.0-33.9, adult   Encounter for long-term opiate analgesic use - Primary   Meds ordered this encounter  Medications   HYDROcodone-acetaminophen (NORCO) 10-325 MG tablet    Sig: Take one tablet tid prn pain    Dispense:  75 tablet    Refill:  0    May fill 90 days from 08/10/21    Order Specific Question:   Supervising Provider    Answer:   Lilyan Punt A [[id number]]   HYDROcodone-acetaminophen (NORCO)  10-325 MG tablet    Sig: Take one tab po TID prn pain    Dispense:  75 tablet    Refill:  0    May fill 60 days from 08/10/21    Order Specific Question:   Supervising Provider    Answer:   Lilyan Punt A [[id number]]   HYDROcodone-acetaminophen (NORCO) 10-325 MG tablet    Sig: Take one tab po TID prn pain    Dispense:  75 tablet    Refill:  0    May fill 30 days from 08/10/21    Order Specific Question:   Supervising Provider    Answer:   Lilyan Punt A [[id number]]   Continue current pain regimen as directed. Encourage patient to do stretching exercises and regular activity.  Recommend weight loss of 10 pounds over the next 3 months, patient agrees with this plan.  States she has a copy of the low back exercises given to her from previous visit. Discussed smoking cessation but patient feels this will be difficult at this point due to her stress level, we will focus on healthy diet and activity. Return in about 3 months (around 12/03/2021).

## 2021-09-24 ENCOUNTER — Other Ambulatory Visit: Payer: Self-pay | Admitting: Family Medicine

## 2021-09-24 DIAGNOSIS — I10 Essential (primary) hypertension: Secondary | ICD-10-CM

## 2021-10-11 ENCOUNTER — Ambulatory Visit (INDEPENDENT_AMBULATORY_CARE_PROVIDER_SITE_OTHER): Payer: 59 | Admitting: Obstetrics and Gynecology

## 2021-10-11 ENCOUNTER — Encounter: Payer: Self-pay | Admitting: Obstetrics and Gynecology

## 2021-10-11 VITALS — BP 129/86 | HR 90 | Ht 64.5 in | Wt 198.0 lb

## 2021-10-11 DIAGNOSIS — R350 Frequency of micturition: Secondary | ICD-10-CM | POA: Diagnosis not present

## 2021-10-11 DIAGNOSIS — N393 Stress incontinence (female) (male): Secondary | ICD-10-CM

## 2021-10-11 DIAGNOSIS — N3281 Overactive bladder: Secondary | ICD-10-CM

## 2021-10-11 LAB — POCT URINALYSIS DIPSTICK
Bilirubin, UA: NEGATIVE
Blood, UA: NEGATIVE
Glucose, UA: NEGATIVE
Ketones, UA: NEGATIVE
Leukocytes, UA: NEGATIVE
Nitrite, UA: NEGATIVE
Protein, UA: POSITIVE — AB
Spec Grav, UA: >=1.03 — AB (ref 1.010–1.025)
Urobilinogen, UA: 0.2 E.U./dL
pH, UA: 5.5 (ref 5.0–8.0)

## 2021-10-11 NOTE — Patient Instructions (Signed)

## 2021-10-11 NOTE — Progress Notes (Signed)
[facility] New Patient Evaluation and Consultation  Referring Provider: Babs Sciara, MD PCP: Babs Sciara, MD Date of Service: 10/11/2021  SUBJECTIVE Chief Complaint: New Patient (Initial Visit) (Stephany Judie Petit Cortina is a 51 y.o. female complains of urinary urgency and incontinence./)  History of Present Illness: [name] is a 51 y.o. White or Caucasian female seen in consultation at the request of Dr. Gerda Diss for evaluation of incontinence.    Review of records from Dr Gerda Diss significant for: Has intermittent incontinence throughout the day, new symptom.   Urinary Symptoms: Leaks urine with cough/ sneeze, with a full bladder, with movement to the bathroom, and with urgency Leaks a few times per month- large amounts of urine leakage. UUI >> SUI- can't wait too long to get to the bathroom. Once when she was in her car and another time when washing dishes. Now just goes to the bathroom more often to prevent.  Pad use: 3-4 liners/ mini-pads a month  She is bothered by her UI symptoms. Started after she had stent for kidney stones last year.   Day time voids 4-5.  Nocturia: 1-2 times per night to void. Voiding dysfunction: she empties her bladder well.  does not use a catheter to empty bladder.  When urinating, she feels she has no difficulties Drinks: flavored water, sundrop soda (3-4 cans per day), occasional coffee or tea- not every day  UTIs:  0  UTI's in the last year.   Reports history of kidney stones in Nov 2022- had stone removal in Dec 2022 with [facility] (Dr Pete Glatter).   Pelvic Organ Prolapse Symptoms:                  She Denies a feeling of a bulge the vaginal area.   Bowel Symptom: Bowel movements: 1-2 time(s) per day Stool consistency: soft  Straining: no.  Splinting: no.  Incomplete evacuation: no.  She Denies accidental bowel leakage / fecal incontinence  Sexual Function Sexually active: yes.  Sexual orientation:   heterosexual Pain with sex: No  Pelvic Pain Denies pelvic pain   Past Medical History:  Past Medical History:  Diagnosis Date   Anxiety    Arthritis    Knee   Chronic back pain    Chronic knee pain    Family history of anesthesia complication    Brother and Mother woke up during surgery; Heard and felt could not speak   GERD (gastroesophageal reflux disease)    Headache    stress   HTN (hypertension), benign 01/22/2019   IBS (irritable bowel syndrome)    Kidney stones    Menorrhagia 09/09/2012     Past Surgical History:   Past Surgical History:  Procedure Laterality Date   BIOPSY  02/01/2016   Procedure: BIOPSY;  Surgeon: West Bali, MD;  Location: [facility];  Service: Endoscopy;;  random colon biopsy   CESAREAN SECTION     X2    CHOLECYSTECTOMY N/A 01/17/2013   Procedure: LAPAROSCOPIC CHOLECYSTECTOMY;  Surgeon: Marlane Hatcher, MD;  Location: [facility];  Service: General;  Laterality: N/A;   COLONOSCOPY WITH PROPOFOL N/A 02/01/2016   Procedure: COLONOSCOPY WITH PROPOFOL;  Surgeon: West Bali, MD;  Location: [facility];  Service: Endoscopy;  Laterality: N/A;  11:45 am   CYSTOSCOPY WITH RETROGRADE PYELOGRAM, URETEROSCOPY AND STENT PLACEMENT Right 02/02/2021   Procedure: CYSTOSCOPY WITH BILATERAL RETROGRADE PYELOGRAM, URETEROSCOPY AND RIGHT STENT PLACEMENT;  Surgeon: Milderd Meager., MD;  Location:  [facility];  Service: Urology;  Laterality: Right;   DILITATION & CURRETTAGE/HYSTROSCOPY WITH THERMACHOICE ABLATION N/A 02/25/2013   Procedure: DILATATION & CURETTAGE/HYSTEROSCOPY WITH THERMACHOICE ABLATION;  Surgeon: [name], MD;  Location: [facility];  Service: Gynecology;  Laterality: N/A;  Total Therapy Time:8:54 minutes Temperature:87 degrees   ENDOMETRIAL ABLATION  12/14   ESOPHAGOGASTRODUODENOSCOPY N/A 12/27/2012   Dr. Darrick Penna: stricture at GE junction s/p dilation, non-erosive gastritis, normal duodenum.    HOLMIUM LASER APPLICATION Right 02/02/2021    Procedure: HOLMIUM LASER APPLICATION;  Surgeon: Milderd Meager., MD;  Location: [facility];  Service: Urology;  Laterality: Right;   TOTAL KNEE ARTHROPLASTY Right 12/15/2014   Procedure: TOTAL KNEE ARTHROPLASTY;  Surgeon: Cammy Copa, MD;  Location: [facility];  Service: Orthopedics;  Laterality: Right;   TUBAL LIGATION     along with c-section     Past OB/GYN History: OB History  Gravida Para Term Preterm AB Living  2 2       4   SAB IAB Ectopic Multiple Live Births        2 4    # Outcome Date GA Lbr Len/2nd Weight Sex Delivery Anes PTL Lv  2A Para      CS-High Vert     2B Para           1A Para      CS-High Vert     1B Para             Menopausal: Yes Had endometrial ablation 2014 Last pap smear was 09/2020- negative.    Medications: She has a current medication list which includes the following prescription(s): amlodipine, bc fast pain relief, citalopram, dicyclomine, estradiol, hydrocodone-acetaminophen, hydrocodone-acetaminophen, hydrocodone-acetaminophen, pantoprazole, progesterone, and propranolol.   Allergies: Patient is allergic to doxycycline, penicillins, sulfa antibiotics, and xanax [alprazolam].   Social History:  Social History   Tobacco Use   Smoking status: Every Day    Packs/day: 0.50    Years: 24.00    Total pack years: 12.00    Types: Cigarettes    Start date: 11/26/1983   Smokeless tobacco: Never   Tobacco comments:    quit 3 times for a total of 8 years since 1985  Vaping Use   Vaping Use: Some days   Substances: Nicotine  Substance Use Topics   Alcohol use: No    Comment: socially just mixed drinks    Drug use: No    Relationship status: long-term partner She lives with boyfriend.   She is employed: self- employed and substitute Regular exercise: No History of abuse: No  Family History:   Family History  Problem Relation Age of Onset   Hypertension Mother    Diabetes Mother    Thyroid disease Mother    COPD Mother    Arthritis  Mother    Depression Mother    Mental illness Mother    Stroke Mother    Prostate cancer Father        unsure primary    Colon cancer Father    Diabetes Maternal Grandmother    Diabetes Paternal Grandmother    Cancer Sister    Heart attack Brother    Colon cancer Brother    Diabetes Sister    Depression Sister    Mental illness Sister    Colon polyps Sister      Review of Systems: Review of Systems  Constitutional:  Positive for malaise/fatigue. Negative for fever and weight loss.  Respiratory:  Negative for cough, shortness  of breath and wheezing.   Cardiovascular:  Positive for leg swelling. Negative for chest pain and palpitations.  Gastrointestinal:  Negative for abdominal pain and blood in stool.  Genitourinary:  Negative for dysuria.  Musculoskeletal:  Negative for myalgias.  Skin:  Negative for rash.  Neurological:  Positive for dizziness and headaches.  Endo/Heme/Allergies:  Bruises/bleeds easily.       + hot flashes  Psychiatric/Behavioral:  Positive for depression. The patient is not nervous/anxious.      OBJECTIVE Physical Exam: Vitals:   10/11/21 0847  BP: 129/86  Pulse: 90  Weight: 198 lb (89.8 kg)  Height: 5' 4.5" (1.638 m)    Physical Exam Constitutional:      General: She is not in acute distress. Pulmonary:     Effort: Pulmonary effort is normal.  Abdominal:     General: There is no distension.     Palpations: Abdomen is soft.     Tenderness: There is no abdominal tenderness. There is no rebound.  Musculoskeletal:        General: No swelling. Normal range of motion.  Skin:    General: Skin is warm and dry.     Findings: No rash.  Neurological:     Mental Status: She is alert and oriented to person, place, and time.  Psychiatric:        Mood and Affect: Mood normal.        Behavior: Behavior normal.      GU / Detailed Urogynecologic Evaluation:  Pelvic Exam: Normal external female genitalia; Bartholin's and Skene's glands normal in  appearance; urethral meatus normal in appearance, no urethral masses or discharge.   CST: negative  Speculum exam reveals normal vaginal mucosa without atrophy. Cervix normal appearance. Uterus normal single, nontender. Adnexa no mass, fullness, tenderness.    Pelvic floor strength III/V  Pelvic floor musculature: Right levator non-tender, Right obturator non-tender, Left levator non-tender, Left obturator non-tender  POP-Q:   POP-Q  -3                                            Aa   -3                                           Ba  -9                                              C   2.5                                            Gh  4                                            Pb  10  tvl   -3                                            Ap  -3                                            Bp  -10                                              D     Rectal Exam:  Normal external rectum  Post-Void Residual (PVR) by Bladder Scan: In order to evaluate bladder emptying, we discussed obtaining a postvoid residual and she agreed to this procedure.  Procedure: The ultrasound unit was placed on the patient's abdomen in the suprapubic region after the patient had voided. A PVR of 38 ml was obtained by bladder scan.  Laboratory Results: POC urine: + protein, otherwise negative   ASSESSMENT AND PLAN Ms. Wigglesworth is a 51 y.o. with:  1. Overactive bladder   2. SUI (stress urinary incontinence, female)   3. Urinary frequency    OAB - We discussed the symptoms of overactive bladder (OAB), which include urinary urgency, urinary frequency, nocturia, with or without urge incontinence.  While we do not know the exact etiology of OAB, several treatment options exist. We discussed management including behavioral therapy (decreasing bladder irritants, urge suppression strategies, timed voids, bladder retraining), physical therapy, medication; for  refractory cases posterior tibial nerve stimulation, sacral neuromodulation, and intravesical botulinum toxin injection.  - She is drinking a large amount of irritative beverages (soda, flavored water). She will work on drinking less of these and more plain/ naturally flavored water with fruit. List provided of bladder irritants.   2. SUI - Not as bothersome/ rare. Discussed option of PT but she wants to wait at this time.   Return 3 months or sooner if needed   Marguerita Beards, MD

## 2021-10-15 ENCOUNTER — Other Ambulatory Visit: Payer: Self-pay | Admitting: Adult Health

## 2021-10-25 ENCOUNTER — Other Ambulatory Visit: Payer: Self-pay | Admitting: Family Medicine

## 2021-10-25 DIAGNOSIS — I10 Essential (primary) hypertension: Secondary | ICD-10-CM

## 2021-11-08 ENCOUNTER — Other Ambulatory Visit: Payer: Self-pay | Admitting: Family Medicine

## 2021-11-08 DIAGNOSIS — I10 Essential (primary) hypertension: Secondary | ICD-10-CM

## 2021-11-15 ENCOUNTER — Encounter: Payer: Self-pay | Admitting: Gastroenterology

## 2021-11-16 ENCOUNTER — Other Ambulatory Visit: Payer: Self-pay | Admitting: Family Medicine

## 2021-11-22 ENCOUNTER — Telehealth: Payer: Self-pay

## 2021-11-22 NOTE — Telephone Encounter (Signed)
Pt contacted. Pt made aware that PA will be attempted tomorrow in between patients. Pt verbalized understanding.

## 2021-11-22 NOTE — Telephone Encounter (Signed)
Caller name:Miriana Ouk   On DPR? :Yes  Call back number:[phone]  Provider they see: Luk[place]   Reason for call:Pt is calling [facility] said [employer] needs pre approval for HYDROcodone-acetaminophen (NORCO) 10-325 MG tablet  Ok to leave voice mail

## 2021-11-23 NOTE — Telephone Encounter (Signed)
PA sent to plan via covermymeds

## 2021-11-24 NOTE — Telephone Encounter (Signed)
PA approved 11/23/21- 05/22/22

## 2021-11-24 NOTE — Telephone Encounter (Signed)
Patient notified

## 2021-12-02 ENCOUNTER — Ambulatory Visit: Payer: 59 | Admitting: Nurse Practitioner

## 2021-12-16 ENCOUNTER — Encounter: Payer: Self-pay | Admitting: Nurse Practitioner

## 2021-12-16 ENCOUNTER — Ambulatory Visit (INDEPENDENT_AMBULATORY_CARE_PROVIDER_SITE_OTHER): Payer: 59 | Admitting: Nurse Practitioner

## 2021-12-16 VITALS — BP 132/89 | Ht 64.5 in | Wt 200.2 lb

## 2021-12-16 DIAGNOSIS — M545 Low back pain, unspecified: Secondary | ICD-10-CM | POA: Diagnosis not present

## 2021-12-16 DIAGNOSIS — G8929 Other chronic pain: Secondary | ICD-10-CM | POA: Diagnosis not present

## 2021-12-16 DIAGNOSIS — M1712 Unilateral primary osteoarthritis, left knee: Secondary | ICD-10-CM

## 2021-12-16 DIAGNOSIS — G479 Sleep disorder, unspecified: Secondary | ICD-10-CM

## 2021-12-16 DIAGNOSIS — Z79891 Long term (current) use of opiate analgesic: Secondary | ICD-10-CM

## 2021-12-16 MED ORDER — HYDROCODONE-ACETAMINOPHEN 10-325 MG PO TABS: ORAL_TABLET | ORAL | 0 refills | Status: DC

## 2021-12-16 NOTE — Patient Instructions (Signed)
Take one whole tab of Trazodone (50 mg) for sleep

## 2021-12-16 NOTE — Progress Notes (Signed)
Subjective:    Patient ID: [name], female    DOB: 08/05/1970, 51 y.o.   MRN: [id number]  HPI  This patient was seen today for chronic pain  The medication list was reviewed and updated.  Location of Pain for which the patient has been treated with regarding narcotics: back and joint pain  Onset of this pain: years   -Compliance with medication: yes  - Number patient states they take daily: 1 a day  -when was the last dose patient took? Day before yesterday  The patient was advised the importance of maintaining medication and not using illegal substances with these.  Here for refills and follow up  The patient was educated that we can provide 3 monthly scripts for their medication, it is their responsibility to follow the instructions.  Side effects or complications from medications: no  Patient is aware that pain medications are meant to minimize the severity of the pain to allow their pain levels to improve to allow for better function. They are aware of that pain medications cannot totally remove their pain.  Due for UDT ( at least once per year) : 06/02/21 States she takes her pain medication based on her pain level.  Takes 1 tab per day, may take half a tab if needed.  Tries not to take any more than she has to to control the pain for functioning. Extreme fatigue.  Significant issues with sleep.  Has trouble going to sleep, falls asleep around 12 to 1:00 in the morning and averages at best 3 to 5 hours of sleep per night.  Denies any snoring or gasping for air. Denies chest pain/ischemic type pain or shortness of breath.  Continues to smoke half pack per day.    12/16/2021   10:04 AM  Depression screen PHQ 2/9  Decreased Interest 2  Down, Depressed, Hopeless 0  PHQ - 2 Score 2  Altered sleeping 3  Tired, decreased energy 3  Change in appetite 3  Feeling bad or failure about yourself  0  Trouble concentrating 2  Moving slowly or fidgety/restless 1  Suicidal  thoughts 0  PHQ-9 Score 14            Objective:   Physical Exam NAD.  Alert, oriented.  Lungs clear.  Heart regular rate rhythm.  Fatigued in appearance.  Making good eye contact.  Dressed appropriately for the weather.  Thoughts logical coherent and relevant.  Speech clear. Today's Vitals   12/16/21 0920  BP: 132/89  Weight: 200 lb 3.2 oz (90.8 kg)  Height: 5' 4.5" (1.638 m)   Body mass index is 33.83 kg/m.        Assessment & Plan:   Problem List Items Addressed This Visit       Musculoskeletal and Integument   Arthritis of knee, degenerative   Relevant Medications   HYDROcodone-acetaminophen (NORCO) 10-325 MG tablet   HYDROcodone-acetaminophen (NORCO) 10-325 MG tablet   HYDROcodone-acetaminophen (NORCO) 10-325 MG tablet     Other   Chronic back pain   Relevant Medications   HYDROcodone-acetaminophen (NORCO) 10-325 MG tablet   HYDROcodone-acetaminophen (NORCO) 10-325 MG tablet   HYDROcodone-acetaminophen (NORCO) 10-325 MG tablet   Encounter for long-term opiate analgesic use - Primary   Sleep disturbance      Meds ordered this encounter  Medications   HYDROcodone-acetaminophen (NORCO) 10-325 MG tablet    Sig: Take one tablet tid prn pain    Dispense:  75 tablet    Refill:  0    May fill 60 days from 12/16/21    Order Specific Question:   Supervising Provider    Answer:   Lilyan Punt A [[id number]]   HYDROcodone-acetaminophen (NORCO) 10-325 MG tablet    Sig: Take one tab po TID prn pain    Dispense:  75 tablet    Refill:  0    May fill 30 days from 12/16/21    Order Specific Question:   Supervising Provider    Answer:   Lilyan Punt A [[id number]]   HYDROcodone-acetaminophen (NORCO) 10-325 MG tablet    Sig: Take one tab po TID prn pain    Dispense:  75 tablet    Refill:  0    Order Specific Question:   Supervising Provider    Answer:   Lilyan Punt A [[id number]]   Restart trazodone that she has at home.  Take 150 mg tablet each night for sleep. Continue  pain medication at current dose. Continue Celexa at current dose.  The goal is to improve quality and quantity of sleep.  If depression symptoms persist after this, consider switching medication.  Discussed the importance of stress reduction and adequate rest. Return in about 3 months (around 03/18/2022).

## 2021-12-19 ENCOUNTER — Encounter: Payer: Self-pay | Admitting: *Deleted

## 2021-12-27 ENCOUNTER — Encounter: Payer: Self-pay | Admitting: Family Medicine

## 2021-12-27 ENCOUNTER — Other Ambulatory Visit: Payer: Self-pay | Admitting: Family Medicine

## 2021-12-27 MED ORDER — HYDROCODONE-ACETAMINOPHEN 7.5-325 MG PO TABS: ORAL_TABLET | ORAL | 0 refills | Status: DC

## 2022-01-09 ENCOUNTER — Ambulatory Visit: Payer: 59 | Admitting: Obstetrics and Gynecology

## 2022-01-09 NOTE — Progress Notes (Deleted)
[facility] Return Visit  SUBJECTIVE  History of Present Illness: [name] is a 51 y.o. female seen in follow-up for overactive bladder. Plan at last visit was to decrease bladder irritants. Discussed PT but she wished to wait at that time.      Past Medical History: Patient  has a past medical history of Anxiety, Arthritis, Chronic back pain, Chronic knee pain, Family history of anesthesia complication, GERD (gastroesophageal reflux disease), Headache, HTN (hypertension), benign (01/22/2019), IBS (irritable bowel syndrome), Kidney stones, and Menorrhagia (09/09/2012).   Past Surgical History: She  has a past surgical history that includes Cesarean section; Esophagogastroduodenoscopy (N/A, 12/27/2012); Tubal ligation; Dilatation & currettage/hysteroscopy with thermachoice ablation (N/A, 02/25/2013); Endometrial ablation (12/14); Cholecystectomy (N/A, 01/17/2013); Total knee arthroplasty (Right, 12/15/2014); Colonoscopy with propofol (N/A, 02/01/2016); biopsy (02/01/2016); Cystoscopy with retrograde pyelogram, ureteroscopy and stent placement (Right, 02/02/2021); and Holmium laser application (Right, 02/02/2021).   Medications: She has a current medication list which includes the following prescription(s): amlodipine, bc fast pain relief, citalopram, dicyclomine, estradiol, hydrocodone-acetaminophen, hydrocodone-acetaminophen, hydrocodone-acetaminophen, hydrocodone-acetaminophen, pantoprazole, progesterone, and propranolol.   Allergies: Patient is allergic to doxycycline, penicillins, sulfa antibiotics, and xanax [alprazolam].   Social History: Patient  reports that she has been smoking cigarettes. She started smoking about 38 years ago. She has a 12.00 pack-year smoking history. She has never used smokeless tobacco. She reports that she does not drink alcohol and does not use drugs.      OBJECTIVE     Physical Exam: There were no vitals filed for this visit. Gen: No  apparent distress, A&O x 3.  Detailed Urogynecologic Evaluation:  Deferred. Prior exam showed:      No data to display             ASSESSMENT AND PLAN    Ms. Wix is a 51 y.o. with:  No diagnosis found.

## 2022-01-17 ENCOUNTER — Other Ambulatory Visit: Payer: Self-pay | Admitting: Family Medicine

## 2022-03-10 ENCOUNTER — Telehealth (INDEPENDENT_AMBULATORY_CARE_PROVIDER_SITE_OTHER): Payer: 59 | Admitting: Nurse Practitioner

## 2022-03-10 ENCOUNTER — Encounter: Payer: Self-pay | Admitting: Nurse Practitioner

## 2022-03-10 DIAGNOSIS — M545 Low back pain, unspecified: Secondary | ICD-10-CM | POA: Diagnosis not present

## 2022-03-10 DIAGNOSIS — G479 Sleep disorder, unspecified: Secondary | ICD-10-CM

## 2022-03-10 DIAGNOSIS — Z79891 Long term (current) use of opiate analgesic: Secondary | ICD-10-CM

## 2022-03-10 DIAGNOSIS — M1712 Unilateral primary osteoarthritis, left knee: Secondary | ICD-10-CM

## 2022-03-10 MED ORDER — HYDROCODONE-ACETAMINOPHEN 10-325 MG PO TABS: ORAL_TABLET | ORAL | 0 refills | Status: DC

## 2022-03-10 MED ORDER — HYDROXYZINE PAMOATE 25 MG PO CAPS: ORAL_CAPSULE | ORAL | 0 refills | Status: DC

## 2022-03-10 NOTE — Progress Notes (Unsigned)
Subjective:    Patient ID: [name], female    DOB: 1970/08/31, 52 y.o.   MRN: [id number] Virtual Visit via Video Note  I connected with [name] on 03/11/22 at 11:20 AM EST by a video enabled telemedicine application and verified that I am speaking with the correct person using two identifiers.  Location: Patient: home Provider: office   I discussed the limitations of evaluation and management by telemedicine and the availability of in person appointments. The patient expressed understanding and agreed to proceed.    I discussed the assessment and treatment plan with the patient. The patient was provided an opportunity to ask questions and all were answered. The patient agreed with the plan and demonstrated an understanding of the instructions.   The patient was advised to call back or seek an in-person evaluation if the symptoms worsen or if the condition fails to improve as anticipated.  I provided 15 minutes of non-face-to-face time during this encounter.   Nilda Simmer, NP  HPI  This patient was seen today for chronic pain  The medication list was reviewed and updated.  Location of Pain for which the patient has been treated with regarding narcotics: lower back, knee and joint pain   Onset of this pain: 2020   -Compliance with medication: as needed for severe takes advil or bc's if mild  - Number patient states they take daily: 1/2 to 1 as needed  -when was the last dose patient took? 03/09/22   The patient was advised the importance of maintaining medication and not using illegal substances with these.  Here for refills and follow up  The patient was educated that we can provide 3 monthly scripts for their medication, it is their responsibility to follow the instructions.  Side effects or complications from medications: no  Patient is aware that pain medications are meant to minimize the severity of the pain to allow their pain levels to improve  to allow for better function. They are aware of that pain medications cannot totally remove their pain.  Due for UDT ( at least once per year) : 05/2021  Scale of 1 to 10 ( 1 is least 10 is most) Your pain level without the medicine: 7 Your pain level with medication 3  Scale 1 to 10 ( 1-helps very little, 10 helps very well) How well does your pain medication reduce your pain so you can function better through out the day? 7  Quality of the pain: sharp, electric type   Persistence of the pain: constant   Modifying factors: heating pad; also occasional BC powder, denies any acid reflux or stomach issues.  Sleep deprived getting about 4 hours per night - trazodone causing severe nightmares so she has stopped this         Objective:   Physical Exam Limited due to video visit. Alert, oriented. Speech clear. Calm affect.        Assessment & Plan:   Problem List Items Addressed This Visit       Musculoskeletal and Integument   Arthritis of knee, degenerative   Relevant Medications   HYDROcodone-acetaminophen (NORCO) 10-325 MG tablet   HYDROcodone-acetaminophen (NORCO) 10-325 MG tablet   HYDROcodone-acetaminophen (NORCO) 10-325 MG tablet     Other   Chronic back pain   Relevant Medications   HYDROcodone-acetaminophen (NORCO) 10-325 MG tablet   HYDROcodone-acetaminophen (NORCO) 10-325 MG tablet   HYDROcodone-acetaminophen (NORCO) 10-325 MG tablet   Encounter for long-term opiate analgesic  use - Primary   Sleep disturbance     Meds ordered this encounter  Medications   hydrOXYzine (VISTARIL) 25 MG capsule    Sig: Take one cap po qhs prn sleep    Dispense:  30 capsule    Refill:  0    Order Specific Question:   Supervising Provider    Answer:   Sallee Lange A [[id number]]   HYDROcodone-acetaminophen (NORCO) 10-325 MG tablet    Sig: Take one tablet tid prn pain    Dispense:  75 tablet    Refill:  0    May fill 90 days from 02/26/22    Order Specific Question:    Supervising Provider    Answer:   Sallee Lange A [[id number]]   HYDROcodone-acetaminophen ([place]) 10-325 MG tablet    Sig: Take one tab po TID prn pain    Dispense:  75 tablet    Refill:  0    May fill 60 days from 02/26/22    Order Specific Question:   Supervising Provider    Answer:   Sallee Lange A [[id number]]   HYDROcodone-acetaminophen ([place]) 10-325 MG tablet    Sig: Take one tab po TID prn pain    Dispense:  75 tablet    Refill:  0    May fill 30 days from 02/26/22    Order Specific Question:   Supervising Provider    Answer:   Sallee Lange A [[id number]]   Refill on pain medication.  Continue other measures as well for back pain. Trial of hydroxyzine at bedtime as directed.  Call back if no improvement over the next couple of weeks. Patient's insurance will change next month.  Consider MRI and referral to neurosurgery.  Patient would like to discuss at her next routine visit in 3 months.  Call back in the meantime if any new or worsening symptoms.

## 2022-04-02 ENCOUNTER — Other Ambulatory Visit: Payer: Self-pay | Admitting: Nurse Practitioner

## 2022-06-02 ENCOUNTER — Ambulatory Visit (INDEPENDENT_AMBULATORY_CARE_PROVIDER_SITE_OTHER): Payer: Medicaid Other | Admitting: Nurse Practitioner

## 2022-06-02 VITALS — BP 114/79 | HR 91 | Temp 98.4°F | Ht 64.5 in | Wt 201.0 lb

## 2022-06-02 DIAGNOSIS — G8929 Other chronic pain: Secondary | ICD-10-CM

## 2022-06-02 DIAGNOSIS — Z79891 Long term (current) use of opiate analgesic: Secondary | ICD-10-CM | POA: Diagnosis not present

## 2022-06-02 DIAGNOSIS — G479 Sleep disorder, unspecified: Secondary | ICD-10-CM | POA: Diagnosis not present

## 2022-06-02 DIAGNOSIS — M545 Low back pain, unspecified: Secondary | ICD-10-CM

## 2022-06-02 NOTE — Progress Notes (Unsigned)
   Subjective:    Patient ID: [name], female    DOB: 06-09-1970, 52 y.o.   MRN: [id number]  HPI This patient was seen today for chronic pain  The medication list was reviewed and updated.  Location of Pain for which the patient has been treated with regarding narcotics: lower back, joint from arthritis  Onset of this pain: chronic   -Compliance with medication: daily prn  - Number patient states they take daily: max of two prn pain  -when was the last dose patient took? Today 900 am   The patient was advised the importance of maintaining medication and not using illegal substances with these.  Here for refills and follow up  The patient was educated that we can provide 3 monthly scripts for their medication, it is their responsibility to follow the instructions.  Side effects or complications from medications: no  Patient is aware that pain medications are meant to minimize the severity of the pain to allow their pain levels to improve to allow for better function. They are aware of that pain medications cannot totally remove their pain.  Due for UDT ( at least once per year) : 06/02/2021  Scale of 1 to 10 ( 1 is least 10 is most) Your pain level without the medicine: 6 Your pain level with medication 2  Scale 1 to 10 ( 1-helps very little, 10 helps very well) How well does your pain medication reduce your pain so you can function better through out the day? 2  Quality of the pain: varies from sharp to a achy  Persistence of the pain: constant when present  Modifying factors: heating pad, BC powders Recent long trip with caused pain along the lumbar area bilaterally.  Gets regular GYN physicals. Also c/o of chronic insomnia averaging 2-4 hours per night. Does not take any naps. No relief with Hydroxyzine. Stopped Trazodone due to nightmares. Trouble doing to sleep and staying asleep.        Objective:   Physical Exam NAD. Alert, oriented. Lungs clear. Heart  RRR. Generalized tenderness in the lumbar area. Gait slow but steady. Gets on and off the exam table without difficulty.  Today's Vitals   06/02/22 1319  BP: 114/79  Pulse: 91  Temp: 98.4 F (36.9 C)  SpO2: 97%  Weight: 201 lb (91.2 kg)  Height: 5' 4.5" (1.638 m)   Body mass index is 33.97 kg/m.        Assessment & Plan:  1. Encounter for long-term opiate analgesic use Continue current medication as directed - ToxASSURE Select 13 (MW), Urine - HYDROcodone-acetaminophen (NORCO) 10-325 MG tablet; Take one tablet tid prn pain  Dispense: 75 tablet; Refill: 0 - HYDROcodone-acetaminophen (NORCO) 10-325 MG tablet; Take one tab po TID prn pain  Dispense: 75 tablet; Refill: 0 - HYDROcodone-acetaminophen (NORCO) 10-325 MG tablet; Take one tab po TID prn pain  Dispense: 75 tablet; Refill: 0  2. Sleep disturbance Discussed non pharmacologic measures for sleep.   3. Chronic midline low back pain without sciatica Discussed non pharmacologic measures including weight loss and activity.  Return in about 3 months (around 09/01/2022).

## 2022-06-03 ENCOUNTER — Encounter: Payer: Self-pay | Admitting: Nurse Practitioner

## 2022-06-03 LAB — MED LIST OPTION NOT SELECTED

## 2022-06-03 MED ORDER — HYDROCODONE-ACETAMINOPHEN 10-325 MG PO TABS
ORAL_TABLET | ORAL | 0 refills | Status: DC
Start: 2022-06-03 — End: 2022-06-12

## 2022-06-03 MED ORDER — HYDROCODONE-ACETAMINOPHEN 10-325 MG PO TABS
ORAL_TABLET | ORAL | 0 refills | Status: DC
Start: 2022-06-03 — End: 2022-09-18

## 2022-06-05 LAB — SPECIMEN STATUS REPORT

## 2022-06-05 LAB — TOXASSURE SELECT 13 (MW), URINE

## 2022-06-08 ENCOUNTER — Other Ambulatory Visit: Payer: Self-pay | Admitting: Nurse Practitioner

## 2022-06-08 DIAGNOSIS — Z79891 Long term (current) use of opiate analgesic: Secondary | ICD-10-CM

## 2022-06-12 ENCOUNTER — Telehealth: Payer: Self-pay | Admitting: *Deleted

## 2022-06-12 ENCOUNTER — Other Ambulatory Visit: Payer: Self-pay | Admitting: Family Medicine

## 2022-06-12 DIAGNOSIS — Z79891 Long term (current) use of opiate analgesic: Secondary | ICD-10-CM

## 2022-06-12 MED ORDER — HYDROCODONE-ACETAMINOPHEN 10-325 MG PO TABS
ORAL_TABLET | ORAL | 0 refills | Status: DC
Start: 2022-06-12 — End: 2022-09-18

## 2022-06-12 NOTE — Telephone Encounter (Signed)
Patient needs new script for Hydrocodone sent to pharmacy- thy only gave her a 5 day supply with last script and now has to have a new script as partial fill voids script   [facility]

## 2022-06-12 NOTE — Telephone Encounter (Signed)
Prescription sent as requested.

## 2022-09-18 ENCOUNTER — Other Ambulatory Visit: Payer: Self-pay | Admitting: Family Medicine

## 2022-09-18 ENCOUNTER — Ambulatory Visit (INDEPENDENT_AMBULATORY_CARE_PROVIDER_SITE_OTHER): Payer: Medicaid Other | Admitting: Family Medicine

## 2022-09-18 DIAGNOSIS — Z79891 Long term (current) use of opiate analgesic: Secondary | ICD-10-CM | POA: Diagnosis not present

## 2022-09-18 DIAGNOSIS — M545 Low back pain, unspecified: Secondary | ICD-10-CM

## 2022-09-18 DIAGNOSIS — I10 Essential (primary) hypertension: Secondary | ICD-10-CM | POA: Diagnosis not present

## 2022-09-18 MED ORDER — HYDROCODONE-ACETAMINOPHEN 10-325 MG PO TABS
ORAL_TABLET | ORAL | 0 refills | Status: DC
Start: 2022-09-18 — End: 2022-12-08

## 2022-09-18 MED ORDER — DULOXETINE HCL 30 MG PO CPEP: 30.0000 mg | ORAL_CAPSULE | Freq: Every day | ORAL | 3 refills | Status: DC

## 2022-09-18 MED ORDER — PANTOPRAZOLE SODIUM 40 MG PO TBEC: 40.0000 mg | DELAYED_RELEASE_TABLET | Freq: Every day | ORAL | 1 refills | Status: DC

## 2022-09-18 MED ORDER — AMLODIPINE BESYLATE 2.5 MG PO TABS: 2.5000 mg | ORAL_TABLET | Freq: Every day | ORAL | 1 refills | Status: DC

## 2022-09-18 MED ORDER — DICLOFENAC SODIUM 75 MG PO TBEC: 75.0000 mg | DELAYED_RELEASE_TABLET | Freq: Two times a day (BID) | ORAL | 0 refills | Status: DC

## 2022-09-18 NOTE — Progress Notes (Unsigned)
Subjective:    Patient ID: [name], female    DOB: [birth year], 52 y.o.   MRN: [id number]  HPI Patient states that the Celexa is not working and causing her to feeling anxiety and having irrtability.   This patient was seen today for chronic pain  The medication list was reviewed and updated.   Location of Pain for which the patient has been treated with regarding narcotics: Patient has chronic pain in her joints as well as her low back.  Been present for years.  Onset of this pain: Present for years   -Compliance with medication: Good compliance with medicine  - Number patient states they take daily: She takes 1 tablet twice daily occasionally 3 times daily  -Reason for ongoing use of opioids chronic joint pain and back pain  What other measures have been tried outside of opioids Tylenol, stretches, NSAIDs  In the ongoing specialists regarding this condition none currently  -when was the last dose patient took? Yesterday 1:00 pm  The patient was advised the importance of maintaining medication and not using illegal substances with these.  Here for refills and follow up  The patient was educated that we can provide 3 monthly scripts for their medication, it is their responsibility to follow the instructions.  Side effects or complications from medications: None  Patient is aware that pain medications are meant to minimize the severity of the pain to allow their pain levels to improve to allow for better function. They are aware of that pain medications cannot totally remove their pain.  Due for UDT ( at least once per year) (pain management contract is also completed at the time of the UDT): 06/02/2022  Scale of 1 to 10 ( 1 is least 10 is most) Your pain level without the medicine: 8 Your pain level with medication 4  Scale 1 to 10 ( 1-helps very little, 10 helps very well) How well does your pain medication reduce your pain so you can function better through out  the day? 8  Quality of the pain: Throbbing aching  Persistence of the pain: Present all the time  Modifying factors: Worse with activity         Review of Systems     Objective:   Physical Exam General-in no acute distress Eyes-no discharge Lungs-respiratory rate normal, CTA CV-no murmurs,RRR Extremities skin warm dry no edema Neuro grossly normal Behavior normal, alert Subjective lumbar discomfort Slight decreased range of motion of the shoulder and no obvious rotator cuff tears on physical exam       Assessment & Plan:  Shoulder sprain-to do range of motion exercises anti-inflammatories should gradually get better  Chronic low back pain intermittent sciatica The patient was seen in followup for chronic pain. A review over at their current pain status was discussed. Drug registry was checked. Prescriptions were given.  Regular follow-up recommended. Discussion was held regarding the importance of compliance with medication as well as pain medication contract.  Patient was informed that medication may cause drowsiness and should not be combined  with other medications/alcohol or street drugs. If the patient feels medication is causing altered alertness then do not drive or operate dangerous equipment.  Should be noted that the patient appears to be meeting appropriate use of opioids and response.  Evidenced by improved function and decent pain control without significant side effects and no evidence of overt aberrancy issues.  Upon discussion with the patient today they understand that opioid therapy is  optional and they feel that the pain has been refractory to reasonable conservative measures and is significant and affecting quality of life enough to warrant ongoing therapy and wishes to continue opioids.  Refills were provided.  [facility] medical Board guidelines regarding the pain medicine has been reviewed.  CDC guidelines most updated 2022 has been reviewed by  the prescriber.  PDMP is checked on a regular basis yearly urine drug screen and pain management contract  HTN- patient seen for follow-up regarding HTN.   Diet, medication compliance, appropriate labs and refills were completed.   Importance of keeping blood pressure under good control to lessen the risk of complications discussed Regular follow-up visits discussed

## 2022-09-20 NOTE — Telephone Encounter (Signed)
So in this situation we can switch over to ibuprofen 600 mg 1 taken 3 times daily as needed for pain #30

## 2022-09-22 MED ORDER — IBUPROFEN 600 MG PO TABS
600.0000 mg | ORAL_TABLET | Freq: Three times a day (TID) | ORAL | 0 refills | Status: DC
Start: 2022-09-22 — End: 2022-12-09

## 2022-10-10 ENCOUNTER — Other Ambulatory Visit: Payer: Self-pay | Admitting: Family Medicine

## 2022-11-10 ENCOUNTER — Other Ambulatory Visit: Payer: Self-pay | Admitting: Family Medicine

## 2022-12-08 ENCOUNTER — Ambulatory Visit (INDEPENDENT_AMBULATORY_CARE_PROVIDER_SITE_OTHER): Payer: Self-pay | Admitting: Nurse Practitioner

## 2022-12-08 VITALS — BP 133/87 | HR 94 | Temp 98.2°F | Ht 64.5 in | Wt 206.4 lb

## 2022-12-08 DIAGNOSIS — G8929 Other chronic pain: Secondary | ICD-10-CM

## 2022-12-08 DIAGNOSIS — Z79891 Long term (current) use of opiate analgesic: Secondary | ICD-10-CM

## 2022-12-08 DIAGNOSIS — M5442 Lumbago with sciatica, left side: Secondary | ICD-10-CM

## 2022-12-08 DIAGNOSIS — Z1211 Encounter for screening for malignant neoplasm of colon: Secondary | ICD-10-CM

## 2022-12-08 MED ORDER — HYDROCODONE-ACETAMINOPHEN 10-325 MG PO TABS
ORAL_TABLET | ORAL | 0 refills | Status: DC
Start: 2022-12-08 — End: 2023-03-05

## 2022-12-08 NOTE — Progress Notes (Signed)
Subjective:    Patient ID: [name], female    DOB: 1970/03/14, 52 y.o.   MRN: [id number]  HPI This patient was seen today for chronic pain  The medication list was reviewed and updated.   Location of Pain for which the patient has been treated with regarding narcotics:   Onset of this pain: Chronic   -Compliance with medication: Daily as needed  - Number patient states they take daily: Usually 1-2 daily, rarely has to take the third pill  -Reason for ongoing use of opioids chronic back pain including joint pain from arthritis particularly knees; has noticed more chronic migratory joint pain since she had COVID this allows patient to keep functioning and doing her ADLs.  Has had some right shoulder pain lately but this is improving.  What other measures have been tried outside of opioids Cymbalta, has started stretching and yoga through a free app  In the ongoing specialists regarding this condition none at this time, is uninsured  -when was the last dose patient took? yesterday  The patient was advised the importance of maintaining medication and not using illegal substances with these.  Here for refills and follow up  The patient was educated that we can provide 3 monthly scripts for their medication, it is their responsibility to follow the instructions.  Side effects or complications from medications: None  Patient is aware that pain medications are meant to minimize the severity of the pain to allow their pain levels to improve to allow for better function. They are aware of that pain medications cannot totally remove their pain.  Due for UDT ( at least once per year) (pain management contract is also completed at the time of the UDT): 06/02/2022  Scale of 1 to 10 ( 1 is least 10 is most) Your pain level without the medicine: 6 Your pain level with medication 2  Scale 1 to 10 ( 1-helps very little, 10 helps very well)    Review of Systems  Respiratory:   Negative for cough, chest tightness and shortness of breath.   Cardiovascular:  Negative for chest pain and leg swelling.       Objective:   Physical Exam NAD.  Alert, oriented.  Speech clear.  Calm cheerful affect.  Lungs clear.  Heart regular rate rhythm.  Gait slow but steady. See lab work for joint pain 05/14/2020 Today's Vitals   12/08/22 1017  BP: 133/87  Pulse: 94  Temp: 98.2 F (36.8 C)  SpO2: 97%  Weight: 206 lb 6.4 oz (93.6 kg)  Height: 5' 4.5" (1.638 m)   Body mass index is 34.88 kg/m.        Assessment & Plan:   Problem List Items Addressed This Visit       Other   Chronic back pain   Relevant Medications   HYDROcodone-acetaminophen (NORCO) 10-325 MG tablet   HYDROcodone-acetaminophen (NORCO) 10-325 MG tablet   HYDROcodone-acetaminophen (NORCO) 10-325 MG tablet   Encounter for long-term opiate analgesic use - Primary   Relevant Medications   HYDROcodone-acetaminophen (NORCO) 10-325 MG tablet   HYDROcodone-acetaminophen (NORCO) 10-325 MG tablet   HYDROcodone-acetaminophen (NORCO) 10-325 MG tablet   Other Visit Diagnoses     Screen for colon cancer       Relevant Orders   Ambulatory referral to Gastroenterology      Meds ordered this encounter  Medications   HYDROcodone-acetaminophen (NORCO) 10-325 MG tablet    Sig: Take one tab po TID prn pain  Dispense:  75 tablet    Refill:  0    May fill 30 days from 11/18/22    Order Specific Question:   Supervising Provider    Answer:   Lilyan Punt A [[id number]]   HYDROcodone-acetaminophen (NORCO) 10-325 MG tablet    Sig: Take one tablet tid prn pain    Dispense:  75 tablet    Refill:  0    May fill 60 days from 11/18/22    Order Specific Question:   Supervising Provider    Answer:   Lilyan Punt A [[id number]]   HYDROcodone-acetaminophen (NORCO) 10-325 MG tablet    Sig: Take one tab po TID prn pain    Dispense:  75 tablet    Refill:  0    May fill 90 days from 11/18/22    Order Specific Question:    Supervising Provider    Answer:   Lilyan Punt A [[id number]]   Continue hydrocodone as directed, continue to limit the amount is much as possible. Patient has had 1 shingles vaccine, will be getting the other 1 this fall. Patient plans to schedule her own screening mammogram. Referral sent to GI for screening colonoscopy. Gets gynecological exams. Defers other lab work at this time. Return in about 3 months (around 03/10/2023).

## 2022-12-09 ENCOUNTER — Encounter: Payer: Self-pay | Admitting: Nurse Practitioner

## 2022-12-19 ENCOUNTER — Ambulatory Visit: Payer: Medicaid Other | Admitting: Family Medicine

## 2023-02-19 ENCOUNTER — Other Ambulatory Visit: Payer: Self-pay | Admitting: Family Medicine

## 2023-02-19 DIAGNOSIS — I10 Essential (primary) hypertension: Secondary | ICD-10-CM

## 2023-03-05 ENCOUNTER — Encounter: Payer: Self-pay | Admitting: Nurse Practitioner

## 2023-03-05 ENCOUNTER — Ambulatory Visit: Payer: Self-pay | Admitting: Nurse Practitioner

## 2023-03-05 VITALS — BP 137/89 | HR 95 | Temp 97.8°F | Wt 204.0 lb

## 2023-03-05 DIAGNOSIS — G8929 Other chronic pain: Secondary | ICD-10-CM

## 2023-03-05 DIAGNOSIS — R61 Generalized hyperhidrosis: Secondary | ICD-10-CM

## 2023-03-05 DIAGNOSIS — M5442 Lumbago with sciatica, left side: Secondary | ICD-10-CM

## 2023-03-05 DIAGNOSIS — Z79891 Long term (current) use of opiate analgesic: Secondary | ICD-10-CM

## 2023-03-05 MED ORDER — HYDROCODONE-ACETAMINOPHEN 10-325 MG PO TABS: ORAL_TABLET | ORAL | 0 refills | Status: DC

## 2023-03-05 NOTE — Patient Instructions (Addendum)
 Black cohosh Estroven Soy Flaxseed  Hyperhidrosis Hyperhidrosis is a condition in which the body sweats a lot more than normal (excessively). Sweating is a necessary function for a human body. It is normal to sweat when you are hot, physically active, or anxious. However, hyperhidrosis is sweating to an excessive degree. Although the condition is not a serious one, it can cause embarrassment. There are two kinds of hyperhidrosis: Primary hyperhidrosis. The sweating usually localizes in one part of your body, such as your underarms, or in a few areas, such as your feet, face, underarms, and hands. This is the more common kind of hyperhidrosis. Secondary hyperhidrosis. This type usually affects your entire body. What are the causes? The cause of this condition depends on the kind of hyperhidrosis that you have. Primary hyperhidrosis may be caused by sweat glands that are more active than normal. Secondary hyperhidrosis may be caused by an underlying condition or by taking certain medicines, such as antidepressants or diabetes medicines. Possible conditions that may cause secondary hyperhidrosis include: Diabetes. Hot flashes during menopause. Certain types of cancers. Obesity. Overactive thyroid  (hyperthyroidism). Nervous system injury or disorders. What increases the risk? You are more likely to develop primary hyperhidrosis if you have a family history of the condition. What are the signs or symptoms? Symptoms of this condition include: Feeling like you are sweating constantly, even while you are not being active. Having skin that peels or gets paler or softer in the areas where you sweat the most. Being able to see sweat on your skin. Other symptoms depend on the kind of hyperhidrosis that you have. Symptoms of primary hyperhidrosis may include: Sweating in the same location on both sides of your body. Sweating only during the day and not while you are sleeping. Sweating in specific  areas, such as your underarms, palms, feet, and face. Symptoms of secondary hyperhidrosis may include: Sweating all over your body. Sweating even while you sleep. How is this diagnosed? This condition may be diagnosed by: Medical history. Physical exam. You may also have other tests, including: Tests to measure the amount of sweat you produce and to show the areas where you sweat the most. These tests may involve: Using color-changing chemicals to show patterns of sweating on the skin. Weighing paper that has been applied to the skin. This will show the amount of sweat that your body produces. Measuring the amount of water  that evaporates from the skin. Using infrared technology to show patterns of sweating on the skin. Tests to check for other conditions that may be causing excess sweating. These tests may include blood, urine, or imaging tests. How is this treated? Treatment for this condition depends on the kind of hyperhidrosis that you have and the areas of your body that are affected. Your health care provider will also treat any underlying conditions. Treatment may include: Medicines, such as: Antiperspirants. These are medicines that stop sweat. Injectable medicines. These may include small injections of botulinum toxin. Oral medicines. These are taken by mouth to treat underlying conditions and other symptoms. A procedure to: Temporarily turn off the sweat glands in your hands and feet (iontophoresis). Remove your sweat glands. Cut or destroy the nerves so that they do not send a signal to the sweat glands (sympathectomy). Follow these instructions at home: Lifestyle  Limit or avoid foods or beverages that may increase your risk of sweating, such as: Spicy food. Caffeine. Alcohol. Foods that contain monosodium glutamate (MSG). If your feet sweat: Wear sandals when possible. Do  not wear cotton socks. Wear socks that remove or wick moisture from your feet. Wear leather  shoes. Avoid wearing the same pair of shoes for two days in a row. Try placing sweat pads under your clothes to prevent underarm sweat from showing. Keep a journal of your sweat symptoms and when they occur. This may help you identify things that trigger your sweating. General instructions Take over-the-counter and prescription medicines only as told by your health care provider. Use antiperspirants as told by your health care provider. Consider joining a hyperhidrosis support group. Where to find more information [employer] Association: marketingsheets.si Contact a health care provider if: You have new symptoms. Your symptoms get worse. Summary Hyperhidrosis is a condition in which the body sweats a lot more than normal. With primary hyperhidrosis, the sweating usually localizes in one part of your body, such as your underarms, or in a few areas, such as your feet, face, underarms, and hands. It is caused by overactive sweat glands in the affected area. With secondary hyperhidrosis, the sweating affects your entire body. This is caused by an underlying condition or by taking certain medicines. Treatment for this condition depends on the kind of hyperhidrosis that you have and the parts of your body that are affected. This information is not intended to replace advice given to you by your health care provider. Make sure you discuss any questions you have with your health care provider. Document Revised: 04/12/2021 Document Reviewed: 04/12/2021 Elsevier Patient Education  2024 [employer].

## 2023-03-05 NOTE — Progress Notes (Signed)
 Subjective:    Patient ID: [name], female    DOB: 05/05/1970, 53 y.o.   MRN: [id number]  HPI This patient was seen today for chronic pain  The medication list was reviewed and updated.   Location of Pain for which the patient has been treated with regarding narcotics: lower back and all joints   Onset of this pain: chronic years   -Compliance with medication: most daily   - Number patient states they take daily: at least half a tab; takes based on her level of pain  -Reason for ongoing use of opioids pain  What other measures have been tried outside of opioids exercises for sciatic nerve, soaks, heating pads, BC's  In the ongoing specialists regarding this condition none  -when was the last dose patient took? 1/2 tab this morning   The patient was advised the importance of maintaining medication and not using illegal substances with these.  Here for refills and follow up  The patient was educated that we can provide 3 monthly scripts for their medication, it is their responsibility to follow the instructions.  Side effects or complications from medications: no  Patient is aware that pain medications are meant to minimize the severity of the pain to allow their pain levels to improve to allow for better function. They are aware of that pain medications cannot totally remove their pain.  Due for UDT ( at least once per year) (pain management contract is also completed at the time of the UDT): 06/02/2022  Scale of 1 to 10 ( 1 is least 10 is most) Your pain level without the medicine: 8 Your pain level with medication 2  Scale 1 to 10 ( 1-helps very little, 10 helps very well) How well does your pain medication reduce your pain so you can function better through out the day? 8  Quality of the pain: intense, sharp, dull, achy, burning, throbbing depending on joint area   Persistence of the pain: always Diffuse joint pain at times; has seen some improvement with  Duloxetine .  Also complaints of increased sweating not just at nighttime.  States she sweats very easily.  Has not had a period in several years.  Gets regular preventive health physicals with gynecology.   Review of Systems  Respiratory:  Negative for cough, chest tightness, shortness of breath and wheezing.   Cardiovascular:  Positive for leg swelling. Negative for chest pain.       Mild lower leg edema with prolonged sitting or standing in one place. Has a history of varicose veins. No orthopnea.   Musculoskeletal:  Positive for back pain.       Objective:   Physical Exam NAD.  Alert, oriented.  Speech clear.  Making good eye contact.  Lungs clear.  Heart regular rate rhythm.  Gait slow but steady.  Lower extremities no edema at this time. Today's Vitals   03/05/23 1314  Pulse: 95  Temp: 97.8 F (36.6 C)  SpO2: 96%  Weight: 204 lb (92.5 kg)   Body mass index is 34.48 kg/m.        Assessment & Plan:   Problem List Items Addressed This Visit       Musculoskeletal and Integument   Hyperhidrosis     Other   Chronic back pain   Relevant Medications   HYDROcodone -acetaminophen  (NORCO) 10-325 MG tablet   HYDROcodone -acetaminophen  (NORCO) 10-325 MG tablet   HYDROcodone -acetaminophen  (NORCO) 10-325 MG tablet   Encounter for long-term opiate analgesic use -  Primary   Relevant Medications   HYDROcodone -acetaminophen  (NORCO) 10-325 MG tablet   HYDROcodone -acetaminophen  (NORCO) 10-325 MG tablet   HYDROcodone -acetaminophen  (NORCO) 10-325 MG tablet   Meds ordered this encounter  Medications   HYDROcodone -acetaminophen  (NORCO) 10-325 MG tablet    Sig: Take one tab po TID prn pain    Dispense:  75 tablet    Refill:  0    May fill 30 days from 02/19/23    Supervising Provider:   ALPHONSA HAMILTON A [[id number]]   HYDROcodone -acetaminophen  (NORCO) 10-325 MG tablet    Sig: Take one tablet tid prn pain    Dispense:  75 tablet    Refill:  0    May fill 60 days from 02/19/23     Supervising Provider:   ALPHONSA HAMILTON A [[id number]]   HYDROcodone -acetaminophen  (NORCO) 10-325 MG tablet    Sig: Take one tab po TID prn pain    Dispense:  75 tablet    Refill:  0    May fill 90 days from 02/19/23    Supervising Provider:   ALPHONSA HAMILTON A [[id number]]   Continue current pain medicine regimen and other interventions. Given written and verbal information on hyperhidrosis.  Patient had endometrial ablation.  With her age is very likely that she is postmenopausal.  Patient to try nonmedical interventions but if persists let us  know so we can discuss other options. Return in about 3 months (around 06/03/2023).

## 2023-06-07 ENCOUNTER — Ambulatory Visit: Payer: Self-pay | Admitting: Nurse Practitioner

## 2023-06-11 ENCOUNTER — Encounter: Payer: Self-pay | Admitting: Nurse Practitioner

## 2023-06-11 ENCOUNTER — Ambulatory Visit (INDEPENDENT_AMBULATORY_CARE_PROVIDER_SITE_OTHER): Payer: Self-pay | Admitting: Nurse Practitioner

## 2023-06-11 VITALS — BP 138/79 | HR 78 | Temp 98.2°F | Ht 64.5 in | Wt 212.0 lb

## 2023-06-11 DIAGNOSIS — K219 Gastro-esophageal reflux disease without esophagitis: Secondary | ICD-10-CM

## 2023-06-11 DIAGNOSIS — M545 Low back pain, unspecified: Secondary | ICD-10-CM

## 2023-06-11 DIAGNOSIS — F17210 Nicotine dependence, cigarettes, uncomplicated: Secondary | ICD-10-CM

## 2023-06-11 DIAGNOSIS — Z79891 Long term (current) use of opiate analgesic: Secondary | ICD-10-CM

## 2023-06-11 DIAGNOSIS — G8929 Other chronic pain: Secondary | ICD-10-CM

## 2023-06-11 DIAGNOSIS — M1712 Unilateral primary osteoarthritis, left knee: Secondary | ICD-10-CM

## 2023-06-11 MED ORDER — HYDROCODONE-ACETAMINOPHEN 10-325 MG PO TABS: ORAL_TABLET | ORAL | 0 refills | Status: DC

## 2023-06-11 MED ORDER — HYDROCODONE-ACETAMINOPHEN 10-325 MG PO TABS
ORAL_TABLET | ORAL | 0 refills | Status: DC
Start: 2023-06-11 — End: 2023-10-01

## 2023-06-11 MED ORDER — PHENTERMINE HCL 37.5 MG PO TABS: ORAL_TABLET | ORAL | 2 refills | Status: DC

## 2023-06-11 NOTE — Patient Instructions (Signed)
 Tumeric if not on [facility] Powder Glucosamine Chair yoga

## 2023-06-11 NOTE — Progress Notes (Signed)
 Subjective:    Patient ID: [name], female    DOB: March 15, 1970, 53 y.o.   MRN: [id number]  HPI This patient was seen today for chronic pain  The medication list was reviewed and updated.   Location of Pain for which the patient has been treated with regarding narcotics: low back, left knee, generalized joint pain  Onset of this pain: chronic   -Compliance with medication: yes  - Number patient states they take daily: 1-2 per day  -Reason for ongoing use of opioids chronic pain  What other measures have been tried outside of opioids surgery, NSAIDs, heat, lidocaine patches  In the ongoing specialists regarding this condition: not at this time  -when was the last dose patient took? 9am  The patient was advised the importance of maintaining medication and not using illegal substances with these.  Here for refills and follow up  The patient was educated that we can provide 3 monthly scripts for their medication, it is their responsibility to follow the instructions.  Side effects or complications from medications: none; denies constipation  Patient is aware that pain medications are meant to minimize the severity of the pain to allow their pain levels to improve to allow for better function. They are aware of that pain medications cannot totally remove their pain.  Due for UDT ( at least once per year) (pain management contract is also completed at the time of the UDT): today  Scale of 1 to 10 ( 1 is least 10 is most) Your pain level without the medicine: 8 Your pain level with medication 2  Scale 1 to 10 ( 1-helps very little, 10 helps very well) How well does your pain medication reduce your pain so you can function better through out the day? Yes; allows her to perform ADLs and work  Quality of the pain: sharp, burning, throbbing  Persistence of the pain: all the time  Modifying factors: has gained some weight; wishes to restart Phentermine; lost 60 lbs on this  before; remains uninsured Generalized fatigue. Helped her daughter with event this weekend which required lifting and pulling. Having soreness and pain after this. Occasional flareup of her reflux.  Taking daily pantoprazole.  Large caffeine intake.  Takes daily BC powder.  Has occasional difficulty swallowing while eating always associated with stress.  Does not occur at any other time.  Review of Systems  Constitutional:  Positive for fatigue.  Respiratory:  Negative for cough, chest tightness, shortness of breath and wheezing.   Cardiovascular:  Negative for chest pain and leg swelling.  Musculoskeletal:  Positive for arthralgias and back pain.      06/11/2023    9:42 AM  Depression screen PHQ 2/9  Decreased Interest 1  Down, Depressed, Hopeless 1  PHQ - 2 Score 2  Altered sleeping 3  Tired, decreased energy 3  Change in appetite 2  Feeling bad or failure about yourself  0  Trouble concentrating 1  Moving slowly or fidgety/restless 0  Suicidal thoughts 0  PHQ-9 Score 11  Difficult doing work/chores Somewhat difficult      06/11/2023    9:43 AM 03/05/2023    1:25 PM 12/08/2022   10:25 AM 09/18/2022   11:21 AM  GAD 7 : Generalized Anxiety Score  Nervous, Anxious, on Edge 1 0 1 1  Control/stop worrying 1 0 1 1  Worry too much - different things 1 0 1 1  Trouble relaxing 3 3 2 1   Restless 1  0 0 3  Easily annoyed or irritable 2 1 2 3   Afraid - awful might happen 0 0 0 0  Total GAD 7 Score 9 4 7 10   Anxiety Difficulty Somewhat difficult Somewhat difficult Somewhat difficult Somewhat difficult   Social History   Tobacco Use   Smoking status: Every Day    Current packs/day: 0.50    Average packs/day: 0.5 packs/day for 39.5 years (19.8 ttl pk-yrs)    Types: Cigarettes    Start date: 11/26/1983   Smokeless tobacco: Never   Tobacco comments:    quit 3 times for a total of 8 years since 1985  Vaping Use   Vaping status: Some Days   Substances: Nicotine  Substance Use Topics    Alcohol use: No    Comment: socially just mixed drinks    Drug use: No         Objective:   Physical Exam NAD.  Alert, oriented.  Mildly fatigued in appearance.  Calm affect.  Making good eye contact.  Speech clear.  Dressed appropriately for the weather.  Normal judgment and behavior.  Thoughts logical coherent and relevant.  Lungs air.  Heart regular rate rhythm.  Patient is able to get from the chair onto the exam table without difficulty but slowly due to pain.  Abdomen soft nondistended nontender.  Mild nodularity of the joints in the fingers on the right hand.  Mild erythema of the hands and feet in general, not limited to the joint. Today's Vitals   06/11/23 0938  BP: 138/79  Pulse: 78  Temp: 98.2 F (36.8 C)  SpO2: 98%  Weight: 212 lb (96.2 kg)  Height: 5' 4.5" (1.638 m)   Body mass index is 35.83 kg/m.        Assessment & Plan:   Problem List Items Addressed This Visit       Digestive   GERD (gastroesophageal reflux disease)     Musculoskeletal and Integument   Arthritis of knee, degenerative     Other   Chronic back pain   Relevant Orders   ToxASSURE Select 13 (MW), Urine   Cigarette smoker   Encounter for long-term opiate analgesic use - Primary   Relevant Orders   ToxASSURE Select 13 (MW), Urine   Meds ordered this encounter  Medications   HYDROcodone-acetaminophen (NORCO) 10-325 MG tablet    Sig: Take one tab po TID prn pain    Dispense:  75 tablet    Refill:  0    May fill 30 days from 05/22/23    Supervising Provider:   Lilyan Punt A [[id number]]   HYDROcodone-acetaminophen (NORCO) 10-325 MG tablet    Sig: Take one tablet tid prn pain    Dispense:  75 tablet    Refill:  0    May fill 60 days from 05/22/23    Supervising Provider:   Lilyan Punt A [[id number]]   HYDROcodone-acetaminophen (NORCO) 10-325 MG tablet    Sig: Take one tab po TID prn pain    Dispense:  75 tablet    Refill:  0    May fill 90 days from 05/22/23    Supervising Provider:    Lilyan Punt A [[id number]]   phentermine (ADIPEX-P) 37.5 MG tablet    Sig: Take one tab po qam for weight loss    Dispense:  30 tablet    Refill:  2    Supervising Provider:   Lilyan Punt A [[id number]]   Continue current pain med regimen as  directed.  Continue lidocaine patches and heat applications.  Restart phentermine as directed with a goal of weight loss of 10 to 20 pounds over the next 3 months.  Discussed importance of weight loss, healthy diet and regular stretching/activity.  Patient feels she cannot quit smoking at this time due to her stress level, encouraged her to cut back is much as possible. Discussed risk factors affecting her reflux including risk associated with tobacco, caffeine, stress and regular NSAID use. Return in about 3 months (around 09/10/2023).

## 2023-06-14 LAB — TOXASSURE SELECT 13 (MW), URINE

## 2023-06-15 ENCOUNTER — Encounter: Payer: Self-pay | Admitting: Nurse Practitioner

## 2023-08-17 ENCOUNTER — Other Ambulatory Visit: Payer: Self-pay | Admitting: Family Medicine

## 2023-08-29 ENCOUNTER — Other Ambulatory Visit: Payer: Self-pay | Admitting: Family Medicine

## 2023-08-29 DIAGNOSIS — I10 Essential (primary) hypertension: Secondary | ICD-10-CM

## 2023-09-10 ENCOUNTER — Ambulatory Visit (INDEPENDENT_AMBULATORY_CARE_PROVIDER_SITE_OTHER): Payer: Self-pay | Admitting: Family Medicine

## 2023-09-10 VITALS — BP 122/82 | HR 100 | Temp 98.6°F | Ht 64.5 in | Wt 196.2 lb

## 2023-09-10 DIAGNOSIS — Z79891 Long term (current) use of opiate analgesic: Secondary | ICD-10-CM

## 2023-09-10 DIAGNOSIS — Z13220 Encounter for screening for lipoid disorders: Secondary | ICD-10-CM

## 2023-09-10 DIAGNOSIS — Z1231 Encounter for screening mammogram for malignant neoplasm of breast: Secondary | ICD-10-CM

## 2023-09-10 DIAGNOSIS — Z1211 Encounter for screening for malignant neoplasm of colon: Secondary | ICD-10-CM

## 2023-09-10 DIAGNOSIS — M545 Low back pain, unspecified: Secondary | ICD-10-CM

## 2023-09-10 DIAGNOSIS — G8929 Other chronic pain: Secondary | ICD-10-CM

## 2023-09-10 MED ORDER — PHENTERMINE HCL 37.5 MG PO TABS: ORAL_TABLET | ORAL | 1 refills | Status: DC

## 2023-09-10 NOTE — Progress Notes (Signed)
 Subjective:    Patient ID: [name], female    DOB: 1970-04-28, 53 y.o.   MRN: [id number]  HPI Patient does have chronic pain.  Patient does relate compliance with taking the medication as directed.  Patient denies negative side effects.  Patient states pain medicine does help with function.  Drug registry was checked.  Patient understands the importance of never driving if feeling sedated.  Patient understands the importance of notifying us  if any problems with pain medicine.  Patient denies medication being used by anyone else.  Patient relates medication is kept in a safe spot.   This patient was seen today for chronic pain  The medication list was reviewed and updated.   Location of Pain for which the patient has been treated with regarding narcotics: Joint pain knees hips hands as well as low back with sciatica into the legs  Onset of this pain: Present for years   -Compliance with medication: Good compliance  - Number patient states they take daily: No more than 2-1/2 daily  -Reason for ongoing use of opioids has tried lidocaine  patches stretches OTC medicines as well as previous knee surgery  What other measures have been tried outside of opioids see above  In the ongoing specialists regarding this condition orthopedics previously  -when was the last dose patient took?  Past 24 hours  The patient was advised the importance of maintaining medication and not using illegal substances with these.  Here for refills and follow up  The patient was educated that we can provide 3 monthly scripts for their medication, it is their responsibility to follow the instructions.  Side effects or complications from medications: Denies side effects  Patient is aware that pain medications are meant to minimize the severity of the pain to allow their pain levels to improve to allow for better function. They are aware of that pain medications cannot totally remove their pain.  Due for  UDT ( at least once per year) (pain management contract is also completed at the time of the UDT): April 2025  Scale of 1 to 10 ( 1 is least 10 is most) Your pain level without the medicine: 8 Your pain level with medication 2  Scale 1 to 10 ( 1-helps very little, 10 helps very well) How well does your pain medication reduce your pain so you can function better through out the day?  9  Quality of the pain: Aching throbbing  Persistence of the pain: Present all time  Modifying factors: Worse with activity   Review of Systems     Objective:   Physical Exam  General-in no acute distress Eyes-no discharge Lungs-respiratory rate normal, CTA CV-no murmurs,RRR Extremities skin warm dry no edema Neuro grossly normal Behavior normal, alert       Assessment & Plan:  1. Encounter for long-term opiate analgesic use (Primary) The patient was seen in followup for chronic pain. A review over at their current pain status was discussed. Drug registry was checked. Prescriptions were given.  Regular follow-up recommended. Discussion was held regarding the importance of compliance with medication as well as pain medication contract.  Patient was informed that medication may cause drowsiness and should not be combined  with other medications/alcohol or street drugs. If the patient feels medication is causing altered alertness then do not drive or operate dangerous equipment.  Should be noted that the patient appears to be meeting appropriate use of opioids and response.  Evidenced by improved function and  decent pain control without significant side effects and no evidence of overt aberrancy issues.  Upon discussion with the patient today they understand that opioid therapy is optional and they feel that the pain has been refractory to reasonable conservative measures and is significant and affecting quality of life enough to warrant ongoing therapy and wishes to continue opioids.  Refills were  provided.  [place]  medical Board guidelines regarding the pain medicine has been reviewed.  CDC guidelines most updated 2022 has been reviewed by the prescriber.  PDMP is checked on a regular basis yearly urine drug screen and pain management contract  Treatment plan for this patient includes #1-gentle stretching exercises as shown daily basis 2.  Mild strength exercises 3 times per week #3 continue pain medications #4 notify us  if any digression   2. Chronic midline low back pain without sciatica Please see above Gentle stretching exercises   3. Lipid screening Lipid profile - Lipid Panel  4. Encounter for screening mammogram for malignant neoplasm of breast Mammogram - MM 3D SCREENING MAMMOGRAM BILATERAL BREAST  5. Screening for colon cancer Referral - Ambulatory referral to Gastroenterology  Follow-up 3 months

## 2023-09-30 ENCOUNTER — Encounter: Payer: Self-pay | Admitting: Family Medicine

## 2023-10-01 ENCOUNTER — Other Ambulatory Visit: Payer: Self-pay | Admitting: Family Medicine

## 2023-10-01 DIAGNOSIS — Z79891 Long term (current) use of opiate analgesic: Secondary | ICD-10-CM

## 2023-10-01 MED ORDER — HYDROCODONE-ACETAMINOPHEN 10-325 MG PO TABS
ORAL_TABLET | ORAL | 0 refills | Status: DC
Start: 2023-10-01 — End: 2023-12-10

## 2023-10-01 MED ORDER — HYDROCODONE-ACETAMINOPHEN 10-325 MG PO TABS: ORAL_TABLET | ORAL | 0 refills | Status: DC

## 2023-12-10 ENCOUNTER — Encounter: Payer: Self-pay | Admitting: Nurse Practitioner

## 2023-12-10 ENCOUNTER — Ambulatory Visit (INDEPENDENT_AMBULATORY_CARE_PROVIDER_SITE_OTHER): Payer: Self-pay | Admitting: Nurse Practitioner

## 2023-12-10 VITALS — BP 135/86 | HR 78 | Temp 98.1°F | Ht 64.5 in | Wt 204.0 lb

## 2023-12-10 DIAGNOSIS — M1712 Unilateral primary osteoarthritis, left knee: Secondary | ICD-10-CM

## 2023-12-10 DIAGNOSIS — Z79891 Long term (current) use of opiate analgesic: Secondary | ICD-10-CM

## 2023-12-10 DIAGNOSIS — M5442 Lumbago with sciatica, left side: Secondary | ICD-10-CM

## 2023-12-10 DIAGNOSIS — E66811 Obesity, class 1: Secondary | ICD-10-CM

## 2023-12-10 DIAGNOSIS — G8929 Other chronic pain: Secondary | ICD-10-CM

## 2023-12-10 DIAGNOSIS — F411 Generalized anxiety disorder: Secondary | ICD-10-CM

## 2023-12-10 DIAGNOSIS — G479 Sleep disorder, unspecified: Secondary | ICD-10-CM

## 2023-12-10 DIAGNOSIS — I10 Essential (primary) hypertension: Secondary | ICD-10-CM

## 2023-12-10 MED ORDER — HYDROCODONE-ACETAMINOPHEN 10-325 MG PO TABS: ORAL_TABLET | ORAL | 0 refills | Status: DC

## 2023-12-10 MED ORDER — HYDROXYZINE HCL 25 MG PO TABS: ORAL_TABLET | ORAL | 0 refills | Status: DC

## 2023-12-10 NOTE — Progress Notes (Signed)
 y  Subjective:    Patient ID: [name], female    DOB: 1970/05/21, 53 y.o.   MRN: [id number]  HPI 3 month follow up - pain management Discussed the use of AI scribe software for clinical note transcription with the patient, who gave verbal consent to proceed.  History of Present Illness [name] is a 53 year old female with chronic pain and insomnia who presents for pain management and sleep issues.  She experiences chronic pain primarily in her back and knees. Since having COVID-19 in January 2021, she has experienced joint pain in multiple areas, including her hips, shoulders, fingers, and ankles, with the back and knees being the most affected. Pain fluctuates. She manages her pain with medication and adjusts her dosage during colder weather due to increased swelling and pain. She reports swelling in the foot, particularly during cold weather.  She has ongoing issues with insomnia and has tried several medications, including trazodone , which caused vivid, disturbing dreams and morning grogginess. She has not tried Ambien or Lunesta due to concerns about side effects like sleepwalking. She recalls hydroxyzine  being helpful in the past and is considering trying it again. She is sensitive to medications and cautious about potential grogginess.  She has a history of anxiety, with concerns about her family history, as her mother had anxiety and nerve problems requiring hospitalization. She is cautious about using medications like Xanax, which she finds makes her 'mean as the devil.' She is interested in using hydroxyzine  for anxiety management. Denies suicidal or homicidal thoughts or ideation. Denies self harm behaviors.   She has experienced weight fluctuations and has gained weight recently, attributed to dietary changes and stress. She has to be strict with her diet to maintain her weight and has previously used phentermine , which initially helped but became less effective over  time.  She reports symptoms of reflux and IBS, which are manageable as long as she avoids certain foods. Does not take Pantoprazole  daily.   She is currently self-pay and is concerned about the cost of medical tests and treatments. She is considering obtaining insurance to facilitate further diagnostic workup if needed.    Review of Systems  Respiratory:  Negative for cough, chest tightness, shortness of breath and wheezing.   Cardiovascular:  Negative for chest pain.  Musculoskeletal:  Positive for arthralgias, back pain and joint swelling.      12/10/2023    8:24 AM  Depression screen PHQ 2/9  Decreased Interest 2  Down, Depressed, Hopeless 1  PHQ - 2 Score 3  Altered sleeping 3  Tired, decreased energy 3  Change in appetite 2  Feeling bad or failure about yourself  0  Trouble concentrating 1  Moving slowly or fidgety/restless 0  Suicidal thoughts 0  PHQ-9 Score 12  Difficult doing work/chores Somewhat difficult      12/10/2023    8:25 AM 09/10/2023    3:02 PM 06/11/2023    9:43 AM 03/05/2023    1:25 PM  GAD 7 : Generalized Anxiety Score  Nervous, Anxious, on Edge 2 1 1  0  Control/stop worrying 1 0 1 0  Worry too much - different things 1 0 1 0  Trouble relaxing 2 1 3 3   Restless 0 0 1 0  Easily annoyed or irritable 3 2 2 1   Afraid - awful might happen 2 0 0 0  Total GAD 7 Score 11 4 9 4   Anxiety Difficulty Somewhat difficult Somewhat difficult Somewhat difficult Somewhat difficult  Social History   Tobacco Use   Smoking status: Every Day    Current packs/day: 0.50    Average packs/day: 0.5 packs/day for 40.0 years (20.0 ttl pk-yrs)    Types: Cigarettes    Start date: 11/26/1983   Smokeless tobacco: Never   Tobacco comments:    quit 3 times for a total of 8 years since 1985  Vaping Use   Vaping status: Some Days   Substances: Nicotine  Substance Use Topics   Alcohol use: No    Comment: socially just mixed drinks    Drug use: No        Objective:    Physical Exam Vitals and nursing note reviewed.  Constitutional:      General: She is not in acute distress.    Comments: Fatigued in appearance.   Cardiovascular:     Rate and Rhythm: Normal rate and regular rhythm.  Pulmonary:     Effort: Pulmonary effort is normal.     Breath sounds: Normal breath sounds.  Abdominal:     General: There is no distension.     Palpations: Abdomen is soft.     Tenderness: There is no abdominal tenderness.  Musculoskeletal:     Comments: Trace edema lower extremities.   Neurological:     Mental Status: She is alert.  Psychiatric:        Mood and Affect: Mood normal.        Behavior: Behavior normal.        Thought Content: Thought content normal.    Today's Vitals   12/10/23 0819  BP: 135/86  Pulse: 78  Temp: 98.1 F (36.7 C)  SpO2: 96%  Weight: 204 lb (92.5 kg)  Height: 5' 4.5 (1.638 m)   Body mass index is 34.48 kg/m.         Assessment & Plan:  1. Chronic left-sided low back pain with left-sided sciatica (Primary) Chronic pain in multiple joints, exacerbated by cold weather. Pain management is challenging due to self-pay status limiting diagnostic testing. Current regimen effective but requires adjustment during exacerbations. Weight gain and altered gait may contribute to ankle pain. - Continue current pain management regimen within prescribed limits. - Advise wearing supportive shoes. - Consider compression stockings for ankle support. - Monitor for persistent foot pain; consider x-ray if symptoms persist. - Encourage weight loss to reduce joint stress.  2. Osteoarthritis of left knee, unspecified osteoarthritis type Continue pain medication. Recommend starting weight loss of 5-10 lbs.   3. Generalized anxiety disorder Anxiety disorder with severe episodes. Hydroxyzine  considered safe for anxiety management without addiction risk. Concerns about anxiety related to personal and spiritual issues. - Prescribe hydroxyzine  25 mg  for anxiety, as needed, with option for half a tablet during severe episodes. - Advise against combining hydroxyzine  with other antihistamines. - Encourage counseling for anxiety and spiritual concerns. - hydrOXYzine  (ATARAX ) 25 MG tablet; Take 1/2 - 1 tab po BID prn anxiety or sleep. Drowsiness precautions.  Dispense: 60 tablet; Refill: 0  4. Sleep disturbance Chronic insomnia with sensitivity to medication side effects. Hydroxyzine  previously effective and considered safe. - Prescribe hydroxyzine  25 mg for insomnia, 30 minutes to an hour before bedtime. - Advise on sleep hygiene, including consistent sleep schedule and avoiding screens before bed. - Suggest white noise or calming sounds to aid sleep. - hydrOXYzine  (ATARAX ) 25 MG tablet; Take 1/2 - 1 tab po BID prn anxiety or sleep. Drowsiness precautions.  Dispense: 60 tablet; Refill: 0  5. HTN (hypertension),  benign Hypertension well-controlled with low-dose medication. Current reading 136/72 mmHg. - Continue current antihypertensive regimen.  6. Encounter for long-term opiate analgesic use  - HYDROcodone -acetaminophen  (NORCO) 10-325 MG tablet; Take one tablet tid prn pain  Dispense: 75 tablet; Refill: 0 - HYDROcodone -acetaminophen  (NORCO) 10-325 MG tablet; Take one tab po TID prn pain  Dispense: 75 tablet; Refill: 0 - HYDROcodone -acetaminophen  (NORCO) 10-325 MG tablet; Take one tab po TID prn pain  Dispense: 75 tablet; Refill: 0  7. Obesity (BMI 30.0-34.9) Encouraged regular activity such as walking including setting a goal of number of steps per day. Limit sugar and simple carbs in diet.   Return in about 3 months (around 03/11/2024).

## 2024-01-01 ENCOUNTER — Other Ambulatory Visit: Payer: Self-pay | Admitting: Nurse Practitioner

## 2024-01-01 DIAGNOSIS — G479 Sleep disorder, unspecified: Secondary | ICD-10-CM

## 2024-01-01 DIAGNOSIS — F411 Generalized anxiety disorder: Secondary | ICD-10-CM

## 2024-02-21 ENCOUNTER — Other Ambulatory Visit: Payer: Self-pay | Admitting: Family Medicine

## 2024-03-17 ENCOUNTER — Ambulatory Visit: Payer: Self-pay | Admitting: Nurse Practitioner

## 2024-03-17 ENCOUNTER — Encounter: Payer: Self-pay | Admitting: Nurse Practitioner

## 2024-03-17 VITALS — BP 138/82 | HR 63 | Temp 98.4°F | Ht 64.5 in | Wt 209.2 lb

## 2024-03-17 DIAGNOSIS — M5442 Lumbago with sciatica, left side: Secondary | ICD-10-CM

## 2024-03-17 DIAGNOSIS — G8929 Other chronic pain: Secondary | ICD-10-CM

## 2024-03-17 DIAGNOSIS — I10 Essential (primary) hypertension: Secondary | ICD-10-CM

## 2024-03-17 DIAGNOSIS — F321 Major depressive disorder, single episode, moderate: Secondary | ICD-10-CM

## 2024-03-17 DIAGNOSIS — Z79891 Long term (current) use of opiate analgesic: Secondary | ICD-10-CM

## 2024-03-17 DIAGNOSIS — F411 Generalized anxiety disorder: Secondary | ICD-10-CM

## 2024-03-17 DIAGNOSIS — K219 Gastro-esophageal reflux disease without esophagitis: Secondary | ICD-10-CM

## 2024-03-17 MED ORDER — HYDROCODONE-ACETAMINOPHEN 10-325 MG PO TABS: ORAL_TABLET | ORAL | 0 refills | Status: AC

## 2024-03-17 NOTE — Progress Notes (Signed)
 "  Subjective:    Patient ID: [name], female    DOB: 02-09-1971, 54 y.o.   MRN: [id number]  HPI Discussed the use of AI scribe software for clinical note transcription with the patient, who gave verbal consent to proceed.  History of Present Illness [name] is a 54 year old female who presents for pain management.  She experiences chronic left low back pain and left sciatica, which worsen in cold weather. She manages the pain with Vicodin, taking it three times a day, reserving the third dose for severe pain days. The medication allows her to keep working and perform ADLs. She also uses heating pads and BC powders, though she is cautious with BC powders due to potential gastrointestinal issues. The pain affects her back and right knee; she reports that a surgeon previously told her the left knee may eventually need replacement. Has had the right knee replaced.   She is currently taking amlodipine  for hypertension, pantoprazole  for gastroesophageal reflux disease, and duloxetine  30 mg for anxiety, depression, and nerve pain. She also uses hydroxyzine  for sleep but cannot take it daily due to drying effects. She has a history of trying other sleep aids like trazodone , which she did not tolerate well. Feels her lack of sleep is the major issue for her irritability and fatigue.   She smokes half a pack of cigarettes a day and consumes a lot of caffeine, which affects her reflux. She does not consume alcohol. Her weight has remained stable, with a slight increase over the holidays, and she is working towards reducing it to alleviate joint pressure.  She experiences irritable bowel syndrome symptoms, specifically diarrhea during flare-ups. No constipation or blood in stools. She manages her work activities, which involve some physical activity, with the help of her pain medication.     03/17/2024    8:08 AM  Depression screen PHQ 2/9  Decreased Interest 2  Down, Depressed, Hopeless 2   PHQ - 2 Score 4  Altered sleeping 3  Tired, decreased energy 3  Change in appetite 3  Feeling bad or failure about yourself  0  Trouble concentrating 2  Moving slowly or fidgety/restless 0  Suicidal thoughts 0  PHQ-9 Score 15  Difficult doing work/chores Somewhat difficult      03/17/2024    8:08 AM 12/10/2023    8:25 AM 09/10/2023    3:02 PM 06/11/2023    9:43 AM  GAD 7 : Generalized Anxiety Score  Nervous, Anxious, on Edge 1 2 1 1   Control/stop worrying 1 1 0 1  Worry too much - different things 1 1 0 1  Trouble relaxing  2 1 3   Restless 2 0 0 1  Easily annoyed or irritable 3 3 2 2   Afraid - awful might happen 0 2 0 0  Total GAD 7 Score  11 4 9   Anxiety Difficulty Somewhat difficult Somewhat difficult Somewhat difficult Somewhat difficult    Social History[1]      Objective:   Physical Exam NAD.  Alert, oriented.  Calm cheerful affect.  Making good eye contact.  Lungs clear.  Heart regular rate rhythm.  Abdomen soft nondistended nontender. Today's Vitals   03/17/24 0805  BP: 138/82  Pulse: 63  Temp: 98.4 F (36.9 C)  SpO2: 97%  Weight: 209 lb 4 oz (94.9 kg)  Height: 5' 4.5 (1.638 m)   Body mass index is 35.36 kg/m.        Assessment & Plan:  1. Encounter for long-term opiate analgesic use (Primary)  - HYDROcodone -acetaminophen  (NORCO) 10-325 MG tablet; Take one tablet tid prn pain  Dispense: 75 tablet; Refill: 0 - HYDROcodone -acetaminophen  (NORCO) 10-325 MG tablet; Take one tab po TID prn pain  Dispense: 75 tablet; Refill: 0 - HYDROcodone -acetaminophen  (NORCO) 10-325 MG tablet; Take one tab po TID prn pain  Dispense: 75 tablet; Refill: 0  2. Chronic left-sided low back pain with left-sided sciatica Chronic left-sided low back pain with sciatica, exacerbated by cold weather. Pain managed with hydrocodone -acetaminophen  and adjunctive therapies. Emphasized weight management to reduce joint pressure. - Continue hydrocodone -acetaminophen  as needed for  pain. - Encouraged weight loss to reduce joint pressure. - Advised use of soaks, heating pad, and topical analgesics for pain management.  - HYDROcodone -acetaminophen  (NORCO) 10-325 MG tablet; Take one tablet tid prn pain  Dispense: 75 tablet; Refill: 0 - HYDROcodone -acetaminophen  (NORCO) 10-325 MG tablet; Take one tab po TID prn pain  Dispense: 75 tablet; Refill: 0 - HYDROcodone -acetaminophen  (NORCO) 10-325 MG tablet; Take one tab po TID prn pain  Dispense: 75 tablet; Refill: 0  3. Gastroesophageal reflux disease without esophagitis Managed with pantoprazole  as needed. Smoking and caffeine intake are risk factors. - Continue pantoprazole  as needed for reflux. - Advised to monitor caffeine intake and smoking cessation.  4. HTN (hypertension), benign Blood pressure well-controlled with amlodipine . - Continue amlodipine  for blood pressure management.  5. GAD - Increase duloxetine  to 60 mg daily if 30 mg is insufficient, monitor for side effects. - Continue hydroxyzine  for sleep as needed, avoid daily use due to dryness.  6. Depression - Increase duloxetine  to 60 mg daily if 30 mg is insufficient, monitor for side effects. Discussed potential adverse effects. If any issues, go back to 30 mg dose. Contact office when refills are needed. If no improvement in anxiety, depression and sleep, consider other options.   Return in about 3 months (around 06/15/2024).        [1]  Social History Tobacco Use   Smoking status: Every Day    Current packs/day: 0.50    Average packs/day: 0.5 packs/day for 40.3 years (20.2 ttl pk-yrs)    Types: Cigarettes    Start date: 11/26/1983   Smokeless tobacco: Never   Tobacco comments:    quit 3 times for a total of 8 years since 1985  Vaping Use   Vaping status: Some Days   Substances: Nicotine  Substance Use Topics   Alcohol use: No    Comment: socially just mixed drinks    Drug use: No   "

## 2024-06-10 ENCOUNTER — Ambulatory Visit: Payer: Self-pay | Admitting: Nurse Practitioner
